# Patient Record
Sex: Female | Born: 1955 | Race: White | Hispanic: No | Smoking: Current every day smoker
Health system: Southern US, Community
[De-identification: ages and names within clinical notes are randomized; demographics above are authoritative.]

## PROBLEM LIST (undated history)

## (undated) DIAGNOSIS — J329 Chronic sinusitis, unspecified: Secondary | ICD-10-CM

## (undated) DIAGNOSIS — R112 Nausea with vomiting, unspecified: Secondary | ICD-10-CM

## (undated) DIAGNOSIS — R51 Headache: Secondary | ICD-10-CM

## (undated) DIAGNOSIS — K219 Gastro-esophageal reflux disease without esophagitis: Secondary | ICD-10-CM

## (undated) DIAGNOSIS — G43909 Migraine, unspecified, not intractable, without status migrainosus: Secondary | ICD-10-CM

## (undated) DIAGNOSIS — D649 Anemia, unspecified: Secondary | ICD-10-CM

## (undated) DIAGNOSIS — Z9889 Other specified postprocedural states: Secondary | ICD-10-CM

## (undated) DIAGNOSIS — R519 Headache, unspecified: Secondary | ICD-10-CM

## (undated) DIAGNOSIS — T8859XA Other complications of anesthesia, initial encounter: Secondary | ICD-10-CM

## (undated) DIAGNOSIS — J45909 Unspecified asthma, uncomplicated: Secondary | ICD-10-CM

## (undated) DIAGNOSIS — C4491 Basal cell carcinoma of skin, unspecified: Secondary | ICD-10-CM

## (undated) DIAGNOSIS — F419 Anxiety disorder, unspecified: Secondary | ICD-10-CM

## (undated) DIAGNOSIS — Z923 Personal history of irradiation: Secondary | ICD-10-CM

## (undated) DIAGNOSIS — K56609 Unspecified intestinal obstruction, unspecified as to partial versus complete obstruction: Secondary | ICD-10-CM

## (undated) DIAGNOSIS — C519 Malignant neoplasm of vulva, unspecified: Secondary | ICD-10-CM

## (undated) DIAGNOSIS — T7840XA Allergy, unspecified, initial encounter: Secondary | ICD-10-CM

## (undated) HISTORY — PX: NASAL SINUS SURGERY: SHX719

## (undated) HISTORY — DX: Unspecified asthma, uncomplicated: J45.909

## (undated) HISTORY — DX: Anxiety disorder, unspecified: F41.9

## (undated) HISTORY — PX: HEMORROIDECTOMY: SUR656

## (undated) HISTORY — PX: TONSILLECTOMY: SHX5217

## (undated) HISTORY — DX: Headache, unspecified: R51.9

## (undated) HISTORY — DX: Headache: R51

## (undated) HISTORY — DX: Basal cell carcinoma of skin, unspecified: C44.91

## (undated) HISTORY — DX: Gastro-esophageal reflux disease without esophagitis: K21.9

## (undated) HISTORY — DX: Migraine, unspecified, not intractable, without status migrainosus: G43.909

## (undated) HISTORY — PX: SMALL INTESTINE SURGERY: SHX150

## (undated) HISTORY — DX: Unspecified intestinal obstruction, unspecified as to partial versus complete obstruction: K56.609

## (undated) HISTORY — DX: Allergy, unspecified, initial encounter: T78.40XA

## (undated) HISTORY — PX: OTHER SURGICAL HISTORY: SHX169

## (undated) HISTORY — DX: Personal history of irradiation: Z92.3

## (undated) HISTORY — DX: Chronic sinusitis, unspecified: J32.9

## (undated) HISTORY — PX: SKIN BIOPSY: SHX1

---

## 1987-06-23 HISTORY — PX: ABDOMINAL HYSTERECTOMY: SHX81

## 2004-04-04 ENCOUNTER — Ambulatory Visit: Payer: Self-pay | Admitting: Unknown Physician Specialty

## 2004-11-10 ENCOUNTER — Emergency Department: Payer: Self-pay | Admitting: Emergency Medicine

## 2004-11-11 ENCOUNTER — Ambulatory Visit: Payer: Self-pay | Admitting: Unknown Physician Specialty

## 2004-11-11 ENCOUNTER — Inpatient Hospital Stay: Payer: Self-pay | Admitting: General Surgery

## 2005-03-31 ENCOUNTER — Ambulatory Visit: Payer: Self-pay | Admitting: Internal Medicine

## 2008-10-11 ENCOUNTER — Ambulatory Visit: Payer: Self-pay | Admitting: Internal Medicine

## 2011-06-23 LAB — HM COLONOSCOPY

## 2011-12-08 ENCOUNTER — Ambulatory Visit: Payer: Self-pay | Admitting: Internal Medicine

## 2011-12-21 ENCOUNTER — Ambulatory Visit: Payer: Self-pay | Admitting: Oncology

## 2011-12-21 LAB — CBC CANCER CENTER
Eosinophil %: 3 %
HCT: 40.9 % (ref 35.0–47.0)
HGB: 13.8 g/dL (ref 12.0–16.0)
Lymphocyte #: 1.1 x10 3/mm (ref 1.0–3.6)
Lymphocyte %: 29.5 %
MCH: 35.2 pg — ABNORMAL HIGH (ref 26.0–34.0)
MCHC: 33.7 g/dL (ref 32.0–36.0)
Monocyte %: 8.9 %
Neutrophil %: 57.2 %
Platelet: 127 x10 3/mm — ABNORMAL LOW (ref 150–440)
RBC: 3.92 10*6/uL (ref 3.80–5.20)

## 2011-12-21 LAB — IRON AND TIBC
Iron Saturation: 43 %
Iron: 165 ug/dL (ref 50–170)
Unbound Iron-Bind.Cap.: 214 ug/dL

## 2011-12-21 LAB — FOLATE: Folic Acid: 15.3 ng/mL (ref 3.1–100.0)

## 2011-12-21 LAB — LACTATE DEHYDROGENASE: LDH: 149 U/L (ref 84–246)

## 2012-01-21 ENCOUNTER — Ambulatory Visit: Payer: Self-pay | Admitting: Oncology

## 2012-04-26 ENCOUNTER — Ambulatory Visit: Payer: Self-pay | Admitting: Obstetrics and Gynecology

## 2012-04-26 LAB — CBC WITH DIFFERENTIAL/PLATELET
Eosinophil #: 0.2 10*3/uL (ref 0.0–0.7)
Eosinophil %: 3.8 %
HGB: 14.4 g/dL (ref 12.0–16.0)
Lymphocyte %: 26.9 %
MCH: 35.3 pg — ABNORMAL HIGH (ref 26.0–34.0)
MCHC: 34.7 g/dL (ref 32.0–36.0)
Monocyte #: 0.6 x10 3/mm (ref 0.2–0.9)
Neutrophil %: 55.4 %
Platelet: 121 10*3/uL — ABNORMAL LOW (ref 150–440)
RBC: 4.07 10*6/uL (ref 3.80–5.20)

## 2012-05-06 ENCOUNTER — Ambulatory Visit: Payer: Self-pay | Admitting: Obstetrics and Gynecology

## 2012-05-10 LAB — PATHOLOGY REPORT

## 2013-05-05 ENCOUNTER — Ambulatory Visit: Payer: Self-pay | Admitting: Gynecologic Oncology

## 2013-05-23 ENCOUNTER — Ambulatory Visit: Payer: Self-pay | Admitting: Gynecologic Oncology

## 2013-06-22 ENCOUNTER — Ambulatory Visit: Payer: Self-pay | Admitting: Gynecologic Oncology

## 2013-06-27 ENCOUNTER — Ambulatory Visit: Payer: Self-pay | Admitting: Gynecologic Oncology

## 2013-06-27 LAB — CBC
HCT: 42.1 % (ref 35.0–47.0)
HGB: 14.7 g/dL (ref 12.0–16.0)
MCH: 36.3 pg — ABNORMAL HIGH (ref 26.0–34.0)
MCHC: 34.9 g/dL (ref 32.0–36.0)
MCV: 104 fL — ABNORMAL HIGH (ref 80–100)
PLATELETS: 115 10*3/uL — AB (ref 150–440)
RBC: 4.04 10*6/uL (ref 3.80–5.20)
RDW: 12.5 % (ref 11.5–14.5)
WBC: 5 10*3/uL (ref 3.6–11.0)

## 2013-06-27 LAB — BASIC METABOLIC PANEL
Anion Gap: 4 — ABNORMAL LOW (ref 7–16)
BUN: 8 mg/dL (ref 7–18)
CHLORIDE: 97 mmol/L — AB (ref 98–107)
CO2: 27 mmol/L (ref 21–32)
Calcium, Total: 9.6 mg/dL (ref 8.5–10.1)
Creatinine: 0.58 mg/dL — ABNORMAL LOW (ref 0.60–1.30)
EGFR (African American): >60
EGFR (Non-African Amer.): >60
GLUCOSE: 91 mg/dL (ref 65–99)
OSMOLALITY: 255 (ref 275–301)
Potassium: 4.8 mmol/L (ref 3.5–5.1)
Sodium: 128 mmol/L — ABNORMAL LOW (ref 136–145)

## 2013-07-04 ENCOUNTER — Ambulatory Visit: Payer: Self-pay | Admitting: Gynecologic Oncology

## 2013-07-23 ENCOUNTER — Ambulatory Visit: Payer: Self-pay | Admitting: Gynecologic Oncology

## 2013-09-12 ENCOUNTER — Ambulatory Visit: Payer: Self-pay | Admitting: Oncology

## 2013-09-20 ENCOUNTER — Ambulatory Visit: Payer: Self-pay | Admitting: Oncology

## 2013-10-20 ENCOUNTER — Ambulatory Visit: Payer: Self-pay | Admitting: Oncology

## 2013-10-24 ENCOUNTER — Ambulatory Visit: Payer: Self-pay | Admitting: Gynecologic Oncology

## 2013-11-20 ENCOUNTER — Ambulatory Visit: Payer: Self-pay | Admitting: Oncology

## 2013-11-21 ENCOUNTER — Ambulatory Visit: Payer: Self-pay | Admitting: Gynecologic Oncology

## 2013-12-20 ENCOUNTER — Ambulatory Visit: Payer: Self-pay | Admitting: Gynecologic Oncology

## 2013-12-20 ENCOUNTER — Ambulatory Visit: Payer: Self-pay | Admitting: Oncology

## 2014-02-20 ENCOUNTER — Ambulatory Visit: Payer: Self-pay | Admitting: Oncology

## 2014-03-22 ENCOUNTER — Ambulatory Visit: Payer: Self-pay | Admitting: Oncology

## 2014-09-25 ENCOUNTER — Telehealth: Payer: Self-pay | Admitting: Primary Care

## 2014-09-25 ENCOUNTER — Encounter: Payer: Self-pay | Admitting: Primary Care

## 2014-09-25 ENCOUNTER — Ambulatory Visit (INDEPENDENT_AMBULATORY_CARE_PROVIDER_SITE_OTHER): Payer: Medicare Other | Admitting: Primary Care

## 2014-09-25 VITALS — BP 118/68 | HR 79 | Temp 98.1°F | Ht 62.5 in | Wt 121.0 lb

## 2014-09-25 DIAGNOSIS — N898 Other specified noninflammatory disorders of vagina: Secondary | ICD-10-CM | POA: Insufficient documentation

## 2014-09-25 DIAGNOSIS — G43909 Migraine, unspecified, not intractable, without status migrainosus: Secondary | ICD-10-CM | POA: Insufficient documentation

## 2014-09-25 DIAGNOSIS — G43901 Migraine, unspecified, not intractable, with status migrainosus: Secondary | ICD-10-CM

## 2014-09-25 HISTORY — DX: Other specified noninflammatory disorders of vagina: N89.8

## 2014-09-25 MED ORDER — TRAMADOL HCL 50 MG PO TABS: ORAL_TABLET | ORAL | Status: DC

## 2014-09-25 NOTE — Assessment & Plan Note (Signed)
Pre-cancerous. Was removed once in 2014 and returned in 2015. Referral made to GYN for further evaluation.  Will continue to monitor.

## 2014-09-25 NOTE — Assessment & Plan Note (Addendum)
Chronic for years. Was following with neurology at [facility]. Referral made to [facility] neurology per patient's request. Refilled Tramadol which she takes for prevention. Will continue to monitor progress and learn if neurology would like to try anything else preventative. Controlled substance contract signed and UDS completed in office today.

## 2014-09-25 NOTE — Progress Notes (Signed)
Pre visit review using our clinic review tool, if applicable. No additional management support is needed unless otherwise documented below in the visit note. 

## 2014-09-25 NOTE — Patient Instructions (Signed)
You will be contacted regarding your referrals to GYN and Neurology. Please let me know if you need refills on your medication. Schedule a fasting physical with me in the next 2 months. You will schedule a lab appointment one week prior to your physical at your convenience. Don't hesitate to call me if you have any questions! Welcome to [facility]!

## 2014-09-25 NOTE — Progress Notes (Signed)
Subjective:    Patient ID: Betty Hurst, female    DOB: [birth year], 59 y.o.   MRN: [id number]  HPI  Betty Hurst is a 59 year old female who presents today to establish care and discuss the problems mentioned below. Will obtain old records.  1) Sinus Surgeries/Problems: Long standing history of sinus infections. She's had 14 sinus surgeries and follows with Dr. Harrington Challenger in [facility] (ENT) who manages this. She has an appointment later this year.    2) Chronic Headaches: Headaches have been problematic since her sinus issues which include the 14 sinus surgeries.  She was following with Dr. Winfred Burn at [facility] (neurology), but is no longer in her insurance network. Her headaches are daily and has been taking tramadol daily for prevention over the past three years. She will take 1/2 to one tablet daily. She would like a referral to a neurologist within her network for management. She's tried amitriptyline and Topamax for prevention in the past which have not helped. She's not been trailed on a beta blocker due to her historically low blood pressure. She's been off of her Tramadol for three weeks and is starting to experience her migraines again. Last week she had 2 migraines. They typically will be present with photophobia, phonophobia, and nausea. Currently she's taking ibuprofen multiple times daily for pain.   3) Pre cancerous Vaginal cyst: Was removed in 2014 and was seeing Dr. Star Age from [facility]. Cyst returned in January of 2015, cyst was burned off, then returned again in March 2015 and remains. Would like referral to see Dr. Star Age at [facility] OB[facility] for further evaluation. Last pap was in 2015.   Review of Systems  Constitutional: Negative for fatigue.       Lost 80 pounds in the past 2 years, watching her diet and exercising.  HENT: Negative for rhinorrhea.        Seasonal allergies  Respiratory: Negative for shortness of breath.   Cardiovascular: Negative for chest pain.    Gastrointestinal: Negative for constipation.       See HPI.  Genitourinary: Negative for dysuria, frequency and difficulty urinating.       Left vaginal cyst. See HPI  Musculoskeletal: Negative for myalgias and arthralgias.  Skin: Negative for rash.  Neurological: Positive for headaches. Negative for dizziness.  Psychiatric/Behavioral:       Denies concerns for anxiety or depression       Past Medical History  Diagnosis Date  . Small bowel obstruction     Surgery in 2006    History   Social History  . Marital Status: Married    Spouse Name: N/A  . Number of Children: N/A  . Years of Education: N/A   Occupational History  . Not on file.   Social History Main Topics  . Smoking status: Current Every Day Smoker -- 0.50 packs/day    Types: Cigarettes  . Smokeless tobacco: Not on file  . Alcohol Use: 0.0 oz/week    0 Standard drinks or equivalent per week     Comment: occ  . Drug Use: No  . Sexual Activity: Not on file   Other Topics Concern  . Not on file   Social History Narrative   Married.   Lives in [place].   2 daughters, 3 grandchildren.   On disability from work.   Enjoys being outside, gardening, volunteers at [employer].    Past Surgical History  Procedure Laterality Date  . Nasal sinus surgery  14 surgeries  . Small intestine surgery      Small bowel obstruction in 2006  . Tonsillectomy    . Hemorroidectomy      Family History  Problem Relation Age of Onset  . Stroke Father     Deceased    Allergies  Allergen Reactions  . Morphine Hives and Swelling  . Cefuroxime Axetil Rash    No current outpatient prescriptions on file prior to visit.   No current facility-administered medications on file prior to visit.    BP 118/68 mmHg  Pulse 79  Temp(Src) 98.1 F (36.7 C) (Oral)  Ht 5' 2.5" (1.588 m)  Wt 121 lb (54.885 kg)  BMI 21.76 kg/m2  SpO2 95%    Objective:   Physical Exam  Constitutional: She is oriented  to person, place, and time. She appears well-developed.  HENT:  Head: Normocephalic.  Right Ear: External ear normal.  Left Ear: External ear normal.  Nose: Nose normal.  Mouth/Throat: Oropharynx is clear and moist.  Eyes: Conjunctivae and EOM are normal. Pupils are equal, round, and reactive to light.  Neck: Neck supple. No thyromegaly present.  Cardiovascular: Normal rate and regular rhythm.   Pulmonary/Chest: Effort normal and breath sounds normal.  Abdominal: Soft. Bowel sounds are normal. There is no tenderness.  Musculoskeletal: Normal range of motion.  Lymphadenopathy:    She has no cervical adenopathy.  Neurological: She is alert and oriented to person, place, and time. She has normal reflexes.  Skin: Skin is warm and dry.  Psychiatric: She has a normal mood and affect.          Assessment & Plan:

## 2014-09-25 NOTE — Telephone Encounter (Signed)
emmi mailed  °

## 2014-09-26 ENCOUNTER — Encounter: Payer: Self-pay | Admitting: Primary Care

## 2014-10-13 NOTE — Op Note (Signed)
PATIENT NAME:  Betty Hurst, THURLOW MR#:  [id number] DATE OF BIRTH:  January 14, 1956  DATE OF PROCEDURE:  07/04/2013  PREOPERATIVE DIAGNOSIS: Vaginal intraepithelial neoplasia 3.  POSTOPERATIVE DIAGNOSIS: Vaginal intraepithelial neoplasia 3.   PROCEDURE PERFORMED: Laser vaporization.   SURGEON: Weber Cooks, M.D.   ANESTHESIA: General.   BLOOD LOSS:  None.   INDICATION FOR SURGERY: Ms. Devins is a 59 year old patient who presented with vulvar lesions and biopsy revealed VIN-3. Therefore, the decision was made to proceed with laser vaporization.   FINDINGS AT TIME OF SURGERY: External genitalia with areas of thin white epithelium. No ulceration. No signs of invasion.   OPERATIVE REPORT: After adequate general anesthesia had been obtained, the patient was prepped and draped in ski position. The vulva was evaluated. Using the CO2 laser at a wattage of 20, the abnormal vulvar areas were vaporized. This was done in 2 layers. There were no complications. The carbonation was removed. Silvadene cream was applied.   The patient tolerated the procedure well and was taken to the recovery room in satisfactory condition. Pad, sponge, needle and instrument were correct x 2.   ____________________________ Weber Cooks, MD bem:cs D: 07/11/2013 14:26:30 ET T: 07/11/2013 15:11:50 ET JOB#: [id number]  cc: Weber Cooks, MD, <Dictator> Weber Cooks MD ELECTRONICALLY SIGNED 07/25/2013 12:12

## 2014-10-23 ENCOUNTER — Other Ambulatory Visit: Payer: Self-pay | Admitting: Family Medicine

## 2014-10-23 NOTE — Telephone Encounter (Signed)
Last office visit 09/25/2014.  Last refilled 09/25/2014 for #30 with no refills.  Ok to refill?

## 2014-10-24 ENCOUNTER — Telehealth: Payer: Self-pay | Admitting: Primary Care

## 2014-10-24 NOTE — Telephone Encounter (Signed)
Will you please notify Betty Hurst that I she may pick up her prescription for Tramadol at her convenience.   Thank you!

## 2014-10-24 NOTE — Telephone Encounter (Signed)
[name] printed the Rx. Called patient. Notified her that Rx is ready for pick up. Left in front office.

## 2014-10-24 NOTE — Telephone Encounter (Signed)
Called patient. Notified her that Rx is ready for pick. Left in front office.

## 2014-10-27 ENCOUNTER — Other Ambulatory Visit: Payer: Self-pay | Admitting: Family Medicine

## 2014-10-31 ENCOUNTER — Encounter: Payer: Self-pay | Admitting: Primary Care

## 2014-11-15 ENCOUNTER — Other Ambulatory Visit: Payer: Self-pay | Admitting: Primary Care

## 2014-11-15 DIAGNOSIS — Z0000 Encounter for general adult medical examination without abnormal findings: Secondary | ICD-10-CM

## 2014-11-20 ENCOUNTER — Other Ambulatory Visit (INDEPENDENT_AMBULATORY_CARE_PROVIDER_SITE_OTHER): Payer: Medicare Other

## 2014-11-20 DIAGNOSIS — Z0000 Encounter for general adult medical examination without abnormal findings: Secondary | ICD-10-CM

## 2014-11-20 LAB — COMPREHENSIVE METABOLIC PANEL
ALT: 13 U/L (ref 0–35)
AST: 19 U/L (ref 0–37)
Albumin: 4.4 g/dL (ref 3.5–5.2)
Alkaline Phosphatase: 61 U/L (ref 39–117)
BILIRUBIN TOTAL: 0.8 mg/dL (ref 0.2–1.2)
BUN: 11 mg/dL (ref 6–23)
CHLORIDE: 97 meq/L (ref 96–112)
CO2: 28 meq/L (ref 19–32)
Calcium: 9.9 mg/dL (ref 8.4–10.5)
Creatinine, Ser: 0.8 mg/dL (ref 0.40–1.20)
GFR: 78.67 mL/min (ref >60.00)
Glucose, Bld: 90 mg/dL (ref 70–99)
POTASSIUM: 4.8 meq/L (ref 3.5–5.1)
SODIUM: 133 meq/L — AB (ref 135–145)
TOTAL PROTEIN: 7.4 g/dL (ref 6.0–8.3)

## 2014-11-20 LAB — CBC WITH DIFFERENTIAL/PLATELET
BASOS ABS: 0.1 10*3/uL (ref 0.0–0.1)
BASOS PCT: 1.4 % (ref 0.0–3.0)
Eosinophils Absolute: 0.2 10*3/uL (ref 0.0–0.7)
Eosinophils Relative: 3.5 % (ref 0.0–5.0)
HEMATOCRIT: 46.7 % — AB (ref 36.0–46.0)
HEMOGLOBIN: 15.9 g/dL — AB (ref 12.0–15.0)
LYMPHS ABS: 1.2 10*3/uL (ref 0.7–4.0)
LYMPHS PCT: 25.9 % (ref 12.0–46.0)
MCHC: 34.2 g/dL (ref 30.0–36.0)
MCV: 105.8 fl — ABNORMAL HIGH (ref 78.0–100.0)
MONOS PCT: 10.6 % (ref 3.0–12.0)
Monocytes Absolute: 0.5 10*3/uL (ref 0.1–1.0)
Neutro Abs: 2.8 10*3/uL (ref 1.4–7.7)
Neutrophils Relative %: 58.6 % (ref 43.0–77.0)
Platelets: 110 10*3/uL — ABNORMAL LOW (ref 150.0–400.0)
RBC: 4.41 Mil/uL (ref 3.87–5.11)
RDW: 12.8 % (ref 11.5–15.5)
WBC: 4.8 10*3/uL (ref 4.0–10.5)

## 2014-11-20 LAB — HEMOGLOBIN A1C: Hgb A1c MFr Bld: 4.8 % (ref 4.6–6.5)

## 2014-11-20 LAB — LIPID PANEL
CHOLESTEROL: 215 mg/dL — AB (ref 0–200)
HDL: 110.2 mg/dL (ref >39.00)
LDL Cholesterol: 94 mg/dL (ref 0–99)
NonHDL: 104.8
TRIGLYCERIDES: 55 mg/dL (ref 0.0–149.0)
Total CHOL/HDL Ratio: 2
VLDL: 11 mg/dL (ref 0.0–40.0)

## 2014-11-20 LAB — VITAMIN D 25 HYDROXY (VIT D DEFICIENCY, FRACTURES): VITD: 24.75 ng/mL — ABNORMAL LOW (ref 30.00–100.00)

## 2014-11-20 LAB — TSH: TSH: 0.83 u[IU]/mL (ref 0.35–4.50)

## 2014-11-21 ENCOUNTER — Encounter: Payer: Self-pay | Admitting: *Deleted

## 2014-11-21 ENCOUNTER — Encounter (INDEPENDENT_AMBULATORY_CARE_PROVIDER_SITE_OTHER): Payer: Self-pay

## 2014-11-26 ENCOUNTER — Encounter: Payer: Self-pay | Admitting: Primary Care

## 2014-11-26 ENCOUNTER — Ambulatory Visit (INDEPENDENT_AMBULATORY_CARE_PROVIDER_SITE_OTHER): Payer: Medicare Other | Admitting: Primary Care

## 2014-11-26 VITALS — BP 120/74 | HR 100 | Temp 98.0°F | Ht 63.0 in | Wt 121.1 lb

## 2014-11-26 DIAGNOSIS — G43901 Migraine, unspecified, not intractable, with status migrainosus: Secondary | ICD-10-CM

## 2014-11-26 DIAGNOSIS — Z0000 Encounter for general adult medical examination without abnormal findings: Secondary | ICD-10-CM

## 2014-11-26 DIAGNOSIS — Z13820 Encounter for screening for osteoporosis: Secondary | ICD-10-CM | POA: Diagnosis not present

## 2014-11-26 DIAGNOSIS — G8929 Other chronic pain: Secondary | ICD-10-CM

## 2014-11-26 DIAGNOSIS — Z23 Encounter for immunization: Secondary | ICD-10-CM | POA: Diagnosis not present

## 2014-11-26 DIAGNOSIS — R519 Headache, unspecified: Secondary | ICD-10-CM

## 2014-11-26 DIAGNOSIS — R630 Anorexia: Secondary | ICD-10-CM | POA: Diagnosis not present

## 2014-11-26 DIAGNOSIS — Z720 Tobacco use: Secondary | ICD-10-CM

## 2014-11-26 DIAGNOSIS — R51 Headache: Secondary | ICD-10-CM

## 2014-11-26 MED ORDER — TRAMADOL HCL 50 MG PO TABS: ORAL_TABLET | ORAL | Status: DC

## 2014-11-26 NOTE — Assessment & Plan Note (Signed)
Smoked 1/2 PPD of cigarettes x 16 years. She is interested in quitting but has yet to set a quit date. Encouraged her on her decision and to plan out a quit date. Discussed patches and gum

## 2014-11-26 NOTE — Progress Notes (Signed)
Patient ID: Betty Hurst, female   DOB: 24-Dec-1955, 59 y.o.   MRN: [id number]  HPI: Betty Hurst is a 59 year old female who presents today for Medicare Wellness Visit.  Past Medical History  Diagnosis Date  . Small bowel obstruction     Surgery in 2006    Current Outpatient Prescriptions  Medication Sig Dispense Refill  . traMADol (ULTRAM) 50 MG tablet TAKE 1/2 TO 1 TABLET DAILY FOR HEADACHE PREVENTION 30 tablet 0   No current facility-administered medications for this visit.    Allergies  Allergen Reactions  . Morphine Hives and Swelling  . Cefuroxime Axetil Rash    Family History  Problem Relation Age of Onset  . Stroke Father     Deceased    History   Social History  . Marital Status: Married    Spouse Name: N/A  . Number of Children: N/A  . Years of Education: N/A   Occupational History  . Not on file.   Social History Main Topics  . Smoking status: Current Every Day Smoker -- 0.50 packs/day    Types: Cigarettes  . Smokeless tobacco: Not on file  . Alcohol Use: 0.0 oz/week    0 Standard drinks or equivalent per week     Comment: occ  . Drug Use: No  . Sexual Activity: Not on file   Other Topics Concern  . Not on file   Social History Narrative   Married.   Lives in [place].   2 daughters, 3 grandchildren.   On disability from work.   Enjoys being outside, gardening, volunteers at [employer].    Hospitiliaztions: None over past year.  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  Pneumovax: She's not completed.  Bone Density: Never completed  Colonoscopy: Completed in 2014. Repeat in 2024.  Eye Doctor: Completed 2 years ago. Plans on scheduling an appointment.  Dental Exam: Completed last year. She is scheduled for July.  PAP: Completed October 2015  Mammogram: Completed October 2015.    I have personally reviewed and have noted: 1. The patient's medical and  social history 2. Their use of alcohol, tobacco or illicit drugs 3. Their current medications and supplements 4. The patient's functional ability including ADL's, fall risks, home safety risks and hearing or visual  impairment. 5. Diet and physical activities 6. Evidence for depression or mood disorder  Subjective:   Review of Systems:   Constitutional: Denies fever, malaise, fatigue, headache or abrupt weight changes.  HEENT: Denies eye pain, eye redness, ear pain, ringing in the ears, wax buildup, runny nose, nasal congestion, bloody nose, or sore throat. Respiratory: Denies difficulty breathing, shortness of breath, cough or sputum production.   Cardiovascular: Denies chest pain, chest tightness, palpitations or swelling in the hands or feet.  Gastrointestinal: Denies abdominal pain, bloating, constipation, blood in the stool. She's had diarrhea recently due to stress. She's also had a decrease in appetite for the past several months.  GU: Denies urgency, frequency, pain with urination, burning sensation, blood in urine, odor or discharge. Musculoskeletal: Denies decrease in range of motion, difficulty with gait, muscle pain or joint pain and swelling.  Skin: Denies redness, rashes, lesions or ulcercations.  Neurological: Denies dizziness, difficulty with memory, difficulty with speech or problems with balance and coordination. She is going to the headache clinic next Tuesday for evaluation of chronic headaches.  No other specific complaints in a complete  review of systems (except as listed in HPI above).  Objective:  PE:   BP 120/74 mmHg  Pulse 100  Temp(Src) 98 F (36.7 C) (Oral)  Ht 5\' 3"  (1.6 m)  Wt 121 lb 1.9 oz (54.94 kg)  BMI 21.46 kg/m2  SpO2 97% Wt Readings from Last 3 Encounters:  11/26/14 121 lb 1.9 oz (54.94 kg)  09/25/14 121 lb (54.885 kg)    General: Appears their stated age, well developed, well nourished in NAD. Skin: Warm, dry and intact. No rashes,  lesions or ulcerations noted. HEENT: Head: normal shape and size; Eyes: sclera white, no icterus, conjunctiva pink, PERRLA and EOMs intact; Ears: Tm's gray and intact, normal light reflex; Nose: mucosa pink and moist, septum midline; Throat/Mouth: Teeth present, mucosa pink and moist, no exudate, lesions or ulcerations noted.  Neck: Normal range of motion. Neck supple, trachea midline. No massses, lumps or thyromegaly present.  Cardiovascular: Normal rate and rhythm. S1,S2 noted.  No murmur, rubs or gallops noted. No JVD or BLE edema. No carotid bruits noted. Pulmonary/Chest: Normal effort and positive vesicular breath sounds. No respiratory distress. No wheezes, rales or ronchi noted.  Abdomen: Soft and nontender. Normal bowel sounds, no bruits noted. No distention or masses noted. Liver, spleen and kidneys non palpable. Musculoskeletal: Normal range of motion. No signs of joint swelling. No difficulty with gait.  Neurological: Alert and oriented. Cranial nerves II-XII intact. Coordination normal. +DTRs bilaterally. Psychiatric: Mood and affect normal. Behavior is normal. Judgment and thought content normal.   1) Tobacco Abuse: Smokes 1/2 PPD of cigarettes since 2000.  She is interested in quitting but does not have a quit date in mind.  EKG:  BMET    Component Value Date/Time   NA 133* 11/20/2014 1107   NA 128* 06/27/2013 1350   K 4.8 11/20/2014 1107   K 4.8 06/27/2013 1350   CL 97 11/20/2014 1107   CL 97* 06/27/2013 1350   CO2 28 11/20/2014 1107   CO2 27 06/27/2013 1350   GLUCOSE 90 11/20/2014 1107   GLUCOSE 91 06/27/2013 1350   BUN 11 11/20/2014 1107   BUN 8 06/27/2013 1350   CREATININE 0.80 11/20/2014 1107   CREATININE 0.58* 06/27/2013 1350   CALCIUM 9.9 11/20/2014 1107   CALCIUM 9.6 06/27/2013 1350   GFRNONAA >60 06/27/2013 1350   GFRAA >60 06/27/2013 1350    Lipid Panel     Component Value Date/Time   CHOL 215* 11/20/2014 1107   TRIG 55.0 11/20/2014 1107   HDL 110.20  11/20/2014 1107   CHOLHDL 2 11/20/2014 1107   VLDL 11.0 11/20/2014 1107   LDLCALC 94 11/20/2014 1107    CBC    Component Value Date/Time   WBC 4.8 11/20/2014 1107   WBC 5.0 06/27/2013 1350   RBC 4.41 11/20/2014 1107   RBC 4.04 06/27/2013 1350   HGB 15.9* 11/20/2014 1107   HGB 14.7 06/27/2013 1350   HCT 46.7* 11/20/2014 1107   HCT 42.1 06/27/2013 1350   PLT 110.0* 11/20/2014 1107   PLT 115* 06/27/2013 1350   MCV 105.8* 11/20/2014 1107   MCV 104* 06/27/2013 1350   MCH 36.3* 06/27/2013 1350   MCHC 34.2 11/20/2014 1107   MCHC 34.9 06/27/2013 1350   RDW 12.8 11/20/2014 1107   RDW 12.5 06/27/2013 1350   LYMPHSABS 1.2 11/20/2014 1107   LYMPHSABS 1.2 04/26/2012 1007   MONOABS 0.5 11/20/2014 1107   MONOABS 0.6 04/26/2012 1007   EOSABS 0.2 11/20/2014 1107   EOSABS 0.2 04/26/2012  1007   BASOSABS 0.1 11/20/2014 1107   BASOSABS 0.1 04/26/2012 1007    Hgb A1C Lab Results  Component Value Date   HGBA1C 4.8 11/20/2014      Assessment and Plan:   1) Vitamin D Deficiency: Start Calcium 1200 mg with Vitamin D 600 units daily. Scheduled Dexa Bone Density scan.  2) Tobacco abuse: Select a quit date for tobacco cessation.   3) Chronic Headaches: Follow up with headache specialist as scheduled next Tuesday.  Medicare Annual Wellness Visit:  Diet: Overall healthy with lean meat, vegetables. No fast food consumption. She mainly drinks diet coke, water, gatorade. Physical activity: Walks daily, active in the yard. Depression/mood screen: Negative Hearing: Intact to whispered voice Visual acuity: Grossly normal, performs annual eye exam  ADLs: Capable Fall risk: None Home safety: Good Cognitive evaluation: Intact to orientation, naming, recall and repetition EOL planning: Adv directives, full code/ I agree  Preventative Medicine: Encouraged ms. Popwell to continue her healthy diet and active lifestyle. It was also recommended she select a quit date for tobacco cessation.   All  providers: Alma Friendly, NP- PCP, Doretha Sou, [facility].  Next appointment: Headache clinic next Tuesday. Dr. Star Age in 2 weeks. Alma Friendly in 4 months.

## 2014-11-26 NOTE — Addendum Note (Signed)
Addended by: Jacqualin Combes on: 11/26/2014 04:56 PM   Modules accepted: Orders

## 2014-11-26 NOTE — Assessment & Plan Note (Signed)
Appointment scheduled with headache specialist next Tuesday. Refilled Tramadol.

## 2014-11-26 NOTE — Patient Instructions (Addendum)
Start taking Calcium with vitamin D over the counter. You need to get 1200 mg of calcium and 600 units of vitamin D daily in divided doses. You will be contacted regarding your referral for Bone Density Testing.  Please let us know if you have not heard back within one week.  Complete lab work prior to leaving today. I will notify you of your results. You've been provided a pneumonia vaccine.   Follow up in 4 months for check up.

## 2014-11-26 NOTE — Assessment & Plan Note (Signed)
Pneumovax needed, administered today. Other vaccines up to date. Ordered Dexa bone density scan. Recommended she complete eye exam as it has been 2 years.  Labs unremarkable with the exception of vitamin D. Start Calcium with Vitamin D. OTC daily.

## 2014-11-26 NOTE — Progress Notes (Signed)
Pre visit review using our clinic review tool, if applicable. No additional management support is needed unless otherwise documented below in the visit note. 

## 2014-11-27 ENCOUNTER — Telehealth: Payer: Self-pay | Admitting: Primary Care

## 2014-11-27 LAB — VITAMIN B12: Vitamin B-12: 230 pg/mL (ref 211–911)

## 2014-11-27 LAB — FOLATE: Folate: 17.6 ng/mL (ref >5.9)

## 2014-11-27 NOTE — Telephone Encounter (Signed)
Pt left v/m returning call and request cb [phone].

## 2014-11-27 NOTE — Telephone Encounter (Signed)
Left message asking pt to call office Please see where her last bone density was at. If she has never had one  Ask if she wants to go to [place] or [place] Any day and time

## 2014-11-29 NOTE — Telephone Encounter (Signed)
Left message asking pt to call office  °

## 2014-12-05 ENCOUNTER — Ambulatory Visit: Payer: Medicare Other | Admitting: Neurology

## 2014-12-05 NOTE — Telephone Encounter (Signed)
Left message asking pt to call office  °

## 2014-12-06 NOTE — Telephone Encounter (Signed)
Left message asking pt to call.

## 2014-12-06 NOTE — Telephone Encounter (Signed)
Appointment 6/20 @ [place] Pt aware

## 2014-12-10 ENCOUNTER — Ambulatory Visit: Payer: Medicare Other | Attending: Primary Care

## 2014-12-21 ENCOUNTER — Other Ambulatory Visit: Payer: Self-pay | Admitting: Primary Care

## 2014-12-21 NOTE — Telephone Encounter (Signed)
Electronic refill request. Last Filled:    30 tablet 0 RF on 11/26/2014  Please advise.

## 2014-12-24 NOTE — Telephone Encounter (Signed)
Please call in.  Thanks.   

## 2014-12-25 NOTE — Telephone Encounter (Signed)
Medication phoned to pharmacy.  

## 2015-01-08 ENCOUNTER — Encounter: Payer: Self-pay | Admitting: Neurology

## 2015-01-08 ENCOUNTER — Ambulatory Visit (INDEPENDENT_AMBULATORY_CARE_PROVIDER_SITE_OTHER): Payer: Medicare Other | Admitting: Neurology

## 2015-01-08 VITALS — BP 126/68 | HR 92 | Resp 20 | Ht 62.5 in | Wt 121.6 lb

## 2015-01-08 DIAGNOSIS — M542 Cervicalgia: Secondary | ICD-10-CM

## 2015-01-08 DIAGNOSIS — G43719 Chronic migraine without aura, intractable, without status migrainosus: Secondary | ICD-10-CM

## 2015-01-08 DIAGNOSIS — Z720 Tobacco use: Secondary | ICD-10-CM | POA: Diagnosis not present

## 2015-01-08 DIAGNOSIS — G43711 Chronic migraine without aura, intractable, with status migrainosus: Secondary | ICD-10-CM | POA: Insufficient documentation

## 2015-01-08 HISTORY — DX: Cervicalgia: M54.2

## 2015-01-08 MED ORDER — TIZANIDINE HCL 4 MG PO TABS: ORAL_TABLET | ORAL | Status: DC

## 2015-01-08 NOTE — Progress Notes (Signed)
NEUROLOGY CONSULTATION NOTE  Betty Hurst MRN: [id number] DOB: 31-Mar-1956  Referring provider: Alma Friendly, NP Primary care provider: Alma Friendly, NP  Reason for consult:  Chronic migraine  HISTORY OF PRESENT ILLNESS: Betty Hurst is a 59 year old right-handed female with tobacco abuse who presents for migraine.  Onset:  She has history of migraines since at least 1991.  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 Location:  Bi-frontal and back of head and neck Quality:  Pounding.  She has allodynia involving her forehead. Intensity:  8-9/10 Aura:  no Prodrome:  no Associated symptoms:  Sometimes associated with nausea, vomiting, photophobia, phonophobia, osmophobia, blurred vision (about 1-2 times a month) Duration:  constant Frequency:  constant Triggers/exacerbating factors:  none Relieving factors:  none Activity:  Able to function  Past abortive medication:  Toradol, tylenol, excedrin, advil, sumatriptan Past preventative medication:  Amitriptyline, topiramate, depakote, nortriptyline, sertraline, methadone Other past therapy:  Botox (effective), acupuncture  Current abortive medication:  none Current preventative medication:  Tramadol 25mg  twice daily  Caffeine:  1 can of diet coke daily Alcohol:  occasionally Smoker:  yes Diet:  Eats healthy (no fried foods, eats fresh vegetables, grills) Exercise:  walks Depression/stress:  no Sleep hygiene:  poor Family history of headache:  no  PAST MEDICAL HISTORY: Past Medical History  Diagnosis Date  . Small bowel obstruction     Surgery in 2006  . Headache     PAST SURGICAL HISTORY: Past Surgical History  Procedure Laterality Date  . Nasal sinus surgery      14 surgeries  . Small intestine surgery      Small bowel obstruction in 2006  . Tonsillectomy    . Hemorroidectomy    . Other surgical history        Bladder sling    MEDICATIONS: Current Outpatient Prescriptions on File Prior to Visit  Medication Sig Dispense Refill  . traMADol (ULTRAM) 50 MG tablet TAKE 1/2 TO 1 TABLET DAILY FOR HEADACHE PREVENTION 30 tablet 0   No current facility-administered medications on file prior to visit.    ALLERGIES: Allergies  Allergen Reactions  . Influenza Vaccines Hives  . Morphine Hives and Swelling  . Cefuroxime Axetil Rash    FAMILY HISTORY: Family History  Problem Relation Age of Onset  . Stroke Father     Deceased  . Renal Disease Paternal Grandfather   . Heart failure Paternal Grandfather     SOCIAL HISTORY: History   Social History  . Marital Status: Married    Spouse Name: N/A  . Number of Children: N/A  . Years of Education: N/A   Occupational History  . Not on file.   Social History Main Topics  . Smoking status: Current Every Day Smoker -- 0.50 packs/day    Types: Cigarettes  . Smokeless tobacco: Never Used     Comment: patient is aware that she needs to quit   . Alcohol Use: 0.0 oz/week    0 Standard drinks or equivalent per week     Comment: occ  . Drug Use: No  . Sexual Activity:    Partners: Male   Other Topics Concern  . Not on file   Social History Narrative   Married.   Lives in [place].   2 daughters, 3 grandchildren.   On disability from work.   Enjoys being outside, gardening, volunteers at [employer].  REVIEW OF SYSTEMS: Constitutional: No fevers, chills, or sweats, no generalized fatigue, change in appetite Eyes: No visual changes, double vision, eye pain Ear, nose and throat: No hearing loss, ear pain, nasal congestion, sore throat Cardiovascular: No chest pain, palpitations Respiratory:  No shortness of breath at rest or with exertion, wheezes GastrointestinaI: No nausea, vomiting, diarrhea, abdominal pain, fecal incontinence Genitourinary:  No dysuria, urinary retention or frequency Musculoskeletal:  Neck  pain Integumentary: No rash, pruritus, skin lesions Neurological: as above Psychiatric: poor sleep Endocrine: No palpitations, fatigue, diaphoresis, mood swings, change in appetite, change in weight, increased thirst Hematologic/Lymphatic:  No anemia, purpura, petechiae. Allergic/Immunologic: no itchy/runny eyes, nasal congestion, recent allergic reactions, rashes  PHYSICAL EXAM: Filed Vitals:   01/08/15 1220  BP: 126/68  Pulse: 92  Resp: 20   General: No acute distress.  Patient appears well-groomed.   Head:  Mid-frontal protrusion.  Otherwise, Normocephalic/atraumatic Eyes:  fundi unremarkable, without vessel changes, exudates, hemorrhages or papilledema. Neck: supple, bilateral suboccipital and paraspinal tenderness, full range of motion Back: No paraspinal tenderness Heart: regular rate and rhythm Lungs: Clear to auscultation bilaterally. Vascular: No carotid bruits. Neurological Exam: Mental status: alert and oriented to person, place, and time, recent and remote memory intact, fund of knowledge intact, attention and concentration intact, speech fluent and not dysarthric, language intact. Cranial nerves: CN I: not tested CN II: pupils equal, round and reactive to light, visual fields intact, fundi unremarkable, without vessel changes, exudates, hemorrhages or papilledema. CN III, IV, VI:  full range of motion, no nystagmus, no ptosis CN V: facial sensation intact CN VII: upper and lower face symmetric CN VIII: hearing intact CN IX, X: gag intact, uvula midline CN XI: sternocleidomastoid and trapezius muscles intact CN XII: tongue midline Bulk & Tone: normal, no fasciculations. Motor:  5/5 throughout Sensation: pinprick and vibration intact Deep Tendon Reflexes:  2+ throughout, toes downgoing Finger to nose testing:  No dysmetria Heel to shin:  intact Gait:  Normal station and stride.  Able to turn and walk in tandem. Romberg negative.  IMPRESSION: Chronic migraine  without aura Neck pain  PLAN: 1.  Will set up for Botox 2.  Will provide tizanidine (2mg  to 4mg  every 8 hours as needed for neck pain) 3.  I don't prescribe tramadol as a daily medication 4.  Discussed smoking cessation  45 minutes spent face to face with patient, over 50% spent discussing management.  Thank you for allowing me to take part in the care of this patient.  Metta Clines, DO  CC: Alma Friendly, NP

## 2015-01-08 NOTE — Patient Instructions (Addendum)
1.  Will try to get pre-approval for Botox 2.  For neck pain, try tizanidine 4mg .  Take 1/2 to 1 tablet every 8 hours as needed for neck pain (only take if really needed).  Caution as it may cause drowsiness. 3.  Follow up for Botox

## 2015-01-14 ENCOUNTER — Telehealth: Payer: Self-pay

## 2015-01-14 NOTE — Telephone Encounter (Signed)
Left a voicemail for patient to return my call, in regards to scheduling a Mammogram.  

## 2015-01-16 ENCOUNTER — Encounter: Payer: Self-pay | Admitting: *Deleted

## 2015-01-16 ENCOUNTER — Telehealth: Payer: Self-pay | Admitting: Neurology

## 2015-01-16 NOTE — Telephone Encounter (Signed)
Pt returned your call in regards to Botox/Dawn CB# [phone]

## 2015-01-16 NOTE — Progress Notes (Signed)
03/22/2014 MAMMOGRAM screening completed (progress note 11/26/14)

## 2015-01-16 NOTE — Telephone Encounter (Signed)
Patient is aware of her BOTOX appointment 02/06/15 12:45pm

## 2015-01-21 ENCOUNTER — Other Ambulatory Visit: Payer: Self-pay | Admitting: Family Medicine

## 2015-01-22 NOTE — Telephone Encounter (Signed)
Refill request. tramadol  50 MG tablet   Dispense: 30 tablet   Refills: 0    Last prescribed on 12/24/14. Next follow up at 04/01/15.

## 2015-01-23 ENCOUNTER — Telehealth: Payer: Self-pay | Admitting: Primary Care

## 2015-01-23 MED ORDER — TRAMADOL HCL 50 MG PO TABS: ORAL_TABLET | ORAL | Status: DC

## 2015-01-23 NOTE — Addendum Note (Signed)
Addended by: Jacqualin Combes on: 01/23/2015 02:28 PM   Modules accepted: Orders

## 2015-01-23 NOTE — Telephone Encounter (Addendum)
Pt left v/m requesting refill tramadol [facility] until starts botox 3rd week in August. Neurologist gave muscle relaxer but pt cannot take due to muscle relaxant hurts pt's stomach. Pt request cb.

## 2015-01-23 NOTE — Telephone Encounter (Signed)
Okay to refill since she will not be starting with headache management until late August. Same sig and directions as prior prescription. No refills. Thanks.

## 2015-01-23 NOTE — Telephone Encounter (Signed)
[facility] - [facility] 50 MG tablet Dispense: 30 tablet   Refills: 0   TAKE 1/2 TO 1 TABLET BY MOUTH DAILY AS NEEDED FOR HEADACH PREVENTION

## 2015-02-06 ENCOUNTER — Ambulatory Visit (INDEPENDENT_AMBULATORY_CARE_PROVIDER_SITE_OTHER): Payer: Medicare Other | Admitting: Neurology

## 2015-02-06 ENCOUNTER — Encounter: Payer: Self-pay | Admitting: Neurology

## 2015-02-06 VITALS — BP 120/74 | HR 111 | Wt 122.3 lb

## 2015-02-06 DIAGNOSIS — G43719 Chronic migraine without aura, intractable, without status migrainosus: Secondary | ICD-10-CM | POA: Diagnosis not present

## 2015-02-06 MED ORDER — ONABOTULINUMTOXINA 100 UNITS IJ SOLR
200.0000 [IU] | Freq: Once | INTRAMUSCULAR | Status: AC
  Administered 2015-02-06: 200 [IU] via INTRAMUSCULAR

## 2015-02-06 MED ORDER — ONABOTULINUMTOXINA 100 UNITS IJ SOLR: 100.0000 [IU] | Freq: Once | INTRAMUSCULAR | Status: DC

## 2015-02-06 NOTE — Procedures (Signed)
BOTOX PROCEDURE NOTE  Risks and benefits were discussed with the patient, who appears to understand the discussion and verbal consent was obtained.  200 units were reconstituted with 4 mL 0.9% sodium chloride.  Skin was cleaned with alcohol prior to injection.  A 30-gauge, 0.5-inch needle was used.  Total:  4 mL (200 units)  # of injection sites Muscle   Dose Total 1 each side  Corrugator   0.2 mL (10 units) 1    Procerus   0.1 mL (5 units) 2 each side  Frontalis  0.4 mL (20 units) 4 each side  Temporalis  0.8 mL (40 units) 3 each side  Occipitalis  0.6 mL (30 units) 2 each side  Cervical paraspinals 0.4 mL (20 units) 3 each side  Trapezius  0.6 mL (30 units)    Total 31  Total used  155 units    Total wasted  45 units  The patient tolerated the procedure.    Bradd Merlos R. Tomi Likens, DO

## 2015-02-06 NOTE — Progress Notes (Signed)
See Botox Procedure Note

## 2015-02-20 ENCOUNTER — Telehealth: Payer: Self-pay | Admitting: Primary Care

## 2015-02-20 NOTE — Telephone Encounter (Signed)
Electronically refill request  Last prescribed on 4 weeks ago (01/23/2015) tramadol (ULTRAM) 50 MG tablet Dispense: 30 tablet   Refills: 0   Last seen on 12/06/14. Next apt on 04/01/15.

## 2015-02-21 ENCOUNTER — Telehealth: Payer: Self-pay | Admitting: Primary Care

## 2015-02-21 DIAGNOSIS — R519 Headache, unspecified: Secondary | ICD-10-CM

## 2015-02-21 DIAGNOSIS — R51 Headache: Principal | ICD-10-CM

## 2015-02-21 NOTE — Telephone Encounter (Signed)
She had botox injections on 8/17 and has follow up on 9/30. The goal is to wean her off of Tramadol. We will provide a refill to allow time for the botox to take effect. She is to discuss management options with neurology, or I'm happy to send a referral to pain management. Please phone in Tramadol 50 mg. Take 1/2 to 1 tablet daily for headache prevention. #30, no refills. Thanks.

## 2015-02-21 NOTE — Telephone Encounter (Signed)
Pt would like return call regarding denied medication. Pt states that she is getting botox but not for 6 weeks and can not take muscle relaxer that was prescribed call back number is [phone]

## 2015-02-22 NOTE — Telephone Encounter (Signed)
I left a message for the patient to return my call.

## 2015-02-22 NOTE — Telephone Encounter (Signed)
Already called [facility]

## 2015-02-27 NOTE — Telephone Encounter (Signed)
Pt left v/m returning call and request cb. 

## 2015-02-27 NOTE — Telephone Encounter (Signed)
I left a message for the patient to return my call.

## 2015-02-28 ENCOUNTER — Telehealth: Payer: Self-pay | Admitting: Primary Care

## 2015-02-28 NOTE — Telephone Encounter (Signed)
Noted. Refill sent 1 week ago. Will continue to monitor.

## 2015-02-28 NOTE — Telephone Encounter (Signed)
Patient called back. Notified patient of Kate's comments. Patient stated that she was told it will take several rounds until she would feel the effect of the Botox (6 months to a year). Patient stated she can get Botox every 3 months.  Patient has been getting a lot more neck pain and she was given muscle relaxer but stop taking it since it upset her stomach.

## 2015-02-28 NOTE — Telephone Encounter (Signed)
I left a message for the patient to return my call.

## 2015-03-22 ENCOUNTER — Ambulatory Visit: Payer: Medicare Other | Admitting: Neurology

## 2015-03-25 ENCOUNTER — Other Ambulatory Visit: Payer: Self-pay | Admitting: Primary Care

## 2015-03-25 NOTE — Telephone Encounter (Signed)
Electronically refill request for   tramadol (ULTRAM) 50 MG tablet  Take 1/2 to 1 tablet daily for headache prevention Dispense: 30 tablet   Refills: 0   Last prescribed on 01/23/15. Last seen on 11/26/14. Next follow up on 04/01/15.

## 2015-03-26 NOTE — Telephone Encounter (Signed)
[facility]

## 2015-03-27 ENCOUNTER — Encounter: Payer: Self-pay | Admitting: Neurology

## 2015-03-27 ENCOUNTER — Ambulatory Visit (INDEPENDENT_AMBULATORY_CARE_PROVIDER_SITE_OTHER): Payer: Medicare Other | Admitting: Neurology

## 2015-03-27 VITALS — BP 158/80 | HR 90 | Ht 61.0 in | Wt 125.1 lb

## 2015-03-27 DIAGNOSIS — M25519 Pain in unspecified shoulder: Secondary | ICD-10-CM

## 2015-03-27 DIAGNOSIS — Z720 Tobacco use: Secondary | ICD-10-CM | POA: Diagnosis not present

## 2015-03-27 DIAGNOSIS — IMO0001 Reserved for inherently not codable concepts without codable children: Secondary | ICD-10-CM

## 2015-03-27 DIAGNOSIS — M542 Cervicalgia: Secondary | ICD-10-CM | POA: Diagnosis not present

## 2015-03-27 DIAGNOSIS — G43719 Chronic migraine without aura, intractable, without status migrainosus: Secondary | ICD-10-CM

## 2015-03-27 DIAGNOSIS — R030 Elevated blood-pressure reading, without diagnosis of hypertension: Secondary | ICD-10-CM

## 2015-03-27 MED ORDER — CYCLOBENZAPRINE HCL 10 MG PO TABS
10.0000 mg | ORAL_TABLET | Freq: Three times a day (TID) | ORAL | Status: DC | PRN
Start: 2015-03-27 — End: 2015-04-01

## 2015-03-27 NOTE — Progress Notes (Signed)
NEUROLOGY FOLLOW UP OFFICE NOTE  LAURIEANNE GALLOWAY [id number]  HISTORY OF PRESENT ILLNESS: Betty Hurst is a 59 year old right-handed female with tobacco abuse who follows up for migraine and neck pain.  UPDATE: She is six weeks status post first round of Botox.  There has been no significant change in headaches.  They are still constant.  She notes increased pain in the neck and shoulders, especially on the right.  Tizanidine causes GI upset.  HISTORY: Onset:  She has history of migraines since at least 1991.  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 Location:  Bi-frontal and back of head and neck Quality:  Pounding.  She has allodynia involving her forehead. Initial intensity:  8-9/10 Aura:  no Prodrome:  no Associated symptoms:  Sometimes associated with nausea, vomiting, photophobia, phonophobia, osmophobia, blurred vision (about 1-2 times a month) Initial Duration:  constant Initial Frequency:  constant Triggers/exacerbating factors:  none Relieving factors:  none Activity:  Able to function  Past abortive medication:  Toradol, tylenol, excedrin, advil, sumatriptan Past preventative medication:  Amitriptyline, topiramate, depakote, nortriptyline, sertraline, methadone Other past therapy:  Botox (effective), acupuncture  Current abortive medication:  none Current preventative medication:  Tramadol 25mg  twice daily  Caffeine:  1 can of diet coke daily Alcohol:  occasionally Smoker:  yes Diet:  Eats healthy (no fried foods, eats fresh vegetables, grills) Exercise:  walks Depression/stress:  no Sleep hygiene:  poor Family history of headache:  no  PAST MEDICAL HISTORY: Past Medical History  Diagnosis Date  . Small bowel obstruction ([place])     Surgery in 2006  . Headache     MEDICATIONS: Current Outpatient Prescriptions on File Prior to Visit  Medication Sig Dispense  Refill  . brompheniramine-pseudoephedrine-DM 30-2-10 MG/5ML syrup Take by mouth.    . traMADol (ULTRAM) 50 MG tablet TAKE 1/2 - 1 TABLET BY MOUTH DAILY FOR HEADACHE PREVENTION 30 tablet 0   No current facility-administered medications on file prior to visit.    ALLERGIES: Allergies  Allergen Reactions  . Influenza Vaccines Hives  . Morphine Hives and Swelling  . Cefuroxime Axetil Rash    FAMILY HISTORY: Family History  Problem Relation Age of Onset  . Stroke Father     Deceased  . Renal Disease Paternal Grandfather   . Heart failure Paternal Grandfather     SOCIAL HISTORY: Social History   Social History  . Marital Status: Married    Spouse Name: N/A  . Number of Children: N/A  . Years of Education: N/A   Occupational History  . Not on file.   Social History Main Topics  . Smoking status: Current Every Day Smoker -- 0.50 packs/day    Types: Cigarettes  . Smokeless tobacco: Never Used     Comment: patient is aware that she needs to quit   . Alcohol Use: 0.0 oz/week    0 Standard drinks or equivalent per week     Comment: occ  . Drug Use: No  . Sexual Activity:    Partners: Male   Other Topics Concern  . Not on file   Social History Narrative   Married.   Lives in [place].   2 daughters, 3 grandchildren.   On disability from work.   Enjoys being outside, gardening, volunteers at [employer].    REVIEW OF SYSTEMS: Constitutional: No fevers, chills, or sweats, no generalized fatigue, change in appetite  Eyes: No visual changes, double vision, eye pain Ear, nose and throat: No hearing loss, ear pain, nasal congestion, sore throat Cardiovascular: No chest pain, palpitations Respiratory:  No shortness of breath at rest or with exertion, wheezes GastrointestinaI: No nausea, vomiting, diarrhea, abdominal pain, fecal incontinence Genitourinary:  No dysuria, urinary retention or frequency Musculoskeletal:  Neck and shoulder pain Integumentary: No  rash, pruritus, skin lesions Neurological: as above Psychiatric: No depression, insomnia, anxiety Endocrine: No palpitations, fatigue, diaphoresis, mood swings, change in appetite, change in weight, increased thirst Hematologic/Lymphatic:  No anemia, purpura, petechiae. Allergic/Immunologic: no itchy/runny eyes, nasal congestion, recent allergic reactions, rashes  PHYSICAL EXAM: Filed Vitals:   03/27/15 1412  BP: 158/80  Pulse: 90   General: No acute distress.  Patient appears well-groomed.  Head:  Normocephalic/atraumatic Eyes:  Fundoscopic exam unremarkable without vessel changes, exudates, hemorrhages or papilledema. Neck: supple, bilateral tendnerness, full range of motion Heart:  Regular rate and rhythm Lungs:  Clear to auscultation bilaterally Back: No paraspinal tenderness Neurological Exam: alert and oriented to person, place, and time. Attention span and concentration intact, recent and remote memory intact, fund of knowledge intact.  Speech fluent and not dysarthric, language intact.  CN II-XII intact. Fundoscopic exam unremarkable without vessel changes, exudates, hemorrhages or papilledema.  Bulk and tone normal, muscle strength 5/5 throughout.  Sensation to light touch, temperature and vibration intact.  Deep tendon reflexes 2+ throughout, toes downgoing.  Finger to nose and heel to shin testing intact.  Gait normal, Romberg negative.  IMPRESSION: Chronic migraine.  Discussed that often patients may not note benefits until after second round of Botox. Myofascial neck and shoulder pain Tobacco use Elevated blood pressure  PLAN: 1.  Second round of Botox in 6 weeks 2.  Cyclobenzaprine 10mg  for shoulder pain (caution for drowsiness) 3.  Smoking cessation 4.  Blood pressure should be re-evaluated by PCP  15 minutes spent face to face with patient, over 50% spent discussing management and expectations of Botox.  Metta Clines, DO  CC:  Alma Friendly, NP

## 2015-03-27 NOTE — Patient Instructions (Signed)
For neck and shoulder pain, try cyclobenzaprine 10mg  at bedtime as needed.  You may take up to three times daily as needed.  Caution for drowsiness Follow up in November for Botox

## 2015-04-01 ENCOUNTER — Encounter: Payer: Self-pay | Admitting: Radiology

## 2015-04-01 ENCOUNTER — Ambulatory Visit (INDEPENDENT_AMBULATORY_CARE_PROVIDER_SITE_OTHER): Payer: Medicare Other | Admitting: Primary Care

## 2015-04-01 ENCOUNTER — Encounter: Payer: Self-pay | Admitting: Primary Care

## 2015-04-01 VITALS — BP 146/78 | HR 82 | Temp 98.0°F | Ht 61.0 in | Wt 124.1 lb

## 2015-04-01 DIAGNOSIS — G43719 Chronic migraine without aura, intractable, without status migrainosus: Secondary | ICD-10-CM | POA: Diagnosis not present

## 2015-04-01 DIAGNOSIS — R05 Cough: Secondary | ICD-10-CM | POA: Diagnosis not present

## 2015-04-01 DIAGNOSIS — R059 Cough, unspecified: Secondary | ICD-10-CM

## 2015-04-01 MED ORDER — AZITHROMYCIN 250 MG PO TABS: ORAL_TABLET | ORAL | Status: DC

## 2015-04-01 NOTE — Patient Instructions (Addendum)
Start Azithromycin antibiotics. Take 2 tablets by mouth today, then 1 tablet by mouth daily for 4 additional days.  Try taking Delsym OTC for cough. Please call me if no improvement in cough and symptoms once antibiotic is complete.  Please schedule a follow up appointment in 3 months for re-evaluation of headaches.  It was a pleasure to see you today!

## 2015-04-01 NOTE — Assessment & Plan Note (Signed)
Chronic headaches for years.  Currently undergoing treatment with Dr. Tomi Likens with [facility] neurology.  Overall noticeable difference with botox injections, however she's only completed 1 round. Will continue tramadol as this is her only relief at this point. UDS due today. Follow up in 3 months.

## 2015-04-01 NOTE — Progress Notes (Signed)
Subjective:    Patient ID: Betty Hurst, female    DOB: December 02, 1955, 59 y.o.   MRN: [id number]  HPI  Betty Hurst is a 59 year old female who presents today for follow up of chronic headaches. She has a longstanding history of headaches and is currently undergoing treatment with Dr. Loretta Plume with neurology. She is undergoing botox injections with the last injection being on August 17th 2016 and is due again in November. She is also managed on tramadol 1/2-1 tablet daily as needed for headache management.   Since she's started her botox injections she's not noticed much difference in her headaches but has been told that it may take 6-12 months. She feels good relief with tramadol  2) Cough: Present for the past 2 weeks with postnasal drip and sinus pressure. Denies fevers, chills, fatigue. Last week she had a sore throat. Overall her cough has not improved. She's taken benadryl at night which helps to get to sleep. She's tried Robitussin without relief.   Review of Systems  Constitutional: Positive for fever and chills. Negative for fatigue.  HENT: Positive for sinus pressure. Negative for congestion, ear pain, rhinorrhea and sore throat.   Respiratory: Positive for cough. Negative for shortness of breath.   Cardiovascular: Negative for chest pain.  Musculoskeletal: Negative for myalgias.  Neurological: Positive for headaches.       Past Medical History  Diagnosis Date  . Small bowel obstruction ([place])     Surgery in 2006  . Headache     Social History   Social History  . Marital Status: Married    Spouse Name: N/A  . Number of Children: N/A  . Years of Education: N/A   Occupational History  . Not on file.   Social History Main Topics  . Smoking status: Current Every Day Smoker -- 0.50 packs/day    Types: Cigarettes  . Smokeless tobacco: Never Used     Comment: patient is aware that she needs to quit   . Alcohol Use: 0.0 oz/week    0 Standard drinks or equivalent per week       Comment: occ  . Drug Use: No  . Sexual Activity:    Partners: Male   Other Topics Concern  . Not on file   Social History Narrative   Married.   Lives in [place].   2 daughters, 3 grandchildren.   On disability from work.   Enjoys being outside, gardening, volunteers at [employer].    Past Surgical History  Procedure Laterality Date  . Nasal sinus surgery      14 surgeries  . Small intestine surgery      Small bowel obstruction in 2006  . Tonsillectomy    . Hemorroidectomy    . Other surgical history      Bladder sling    Family History  Problem Relation Age of Onset  . Stroke Father     Deceased  . Renal Disease Paternal Grandfather   . Heart failure Paternal Grandfather     Allergies  Allergen Reactions  . Influenza Vaccines Hives  . Morphine Hives and Swelling  . Cefuroxime Axetil Rash    Current Outpatient Prescriptions on File Prior to Visit  Medication Sig Dispense Refill  . traMADol (ULTRAM) 50 MG tablet TAKE 1/2 - 1 TABLET BY MOUTH DAILY FOR HEADACHE PREVENTION 30 tablet 0   No current facility-administered medications on file prior to visit.    BP 146/78 mmHg  Pulse 82  Temp(Src) 98 F (36.7 C) (Oral)  Ht 5\' 1"  (1.549 m)  Wt 124 lb 1.9 oz (56.3 kg)  BMI 23.46 kg/m2  SpO2 99%    Objective:   Physical Exam  Constitutional: She appears well-nourished.  HENT:  Right Ear: Tympanic membrane and ear canal normal.  Left Ear: Tympanic membrane and ear canal normal.  Nose: Right sinus exhibits maxillary sinus tenderness and frontal sinus tenderness. Left sinus exhibits maxillary sinus tenderness and frontal sinus tenderness.  Mouth/Throat: Oropharynx is clear and moist.  Eyes: Conjunctivae are normal. Pupils are equal, round, and reactive to light.  Neck: Neck supple.  Cardiovascular: Normal rate and regular rhythm.   Pulmonary/Chest: Effort normal and breath sounds normal.  Lymphadenopathy:    She has no cervical  adenopathy.  Skin: Skin is warm and dry.          Assessment & Plan:  Cough/Sinusitis:  Present x 2 weeks with moderate tenderness and pressure to sinuses. Also postnasal drip, and sore throat. Very tender to sinuses upon exam today. Due to presentation and duration of cough will send in RX for Zpak. She reports that Zpack's work the best for her symptoms in the past. She's had numerous sinus surgeries. Discussed supportive treatment with Delsym, antihistamines, water, rest.

## 2015-04-22 ENCOUNTER — Other Ambulatory Visit: Payer: Self-pay | Admitting: Primary Care

## 2015-04-22 NOTE — Telephone Encounter (Signed)
Electronically refill request for   traMADol (ULTRAM) 50 MG tablet   TAKE 1/2 - 1 TABLET BY MOUTH DAILY FOR HEADACHE PREVENTION  Dispense: 30 tablet   Refills: 0     Last prescribed on  03/25/2015. :ast seen on 04/01/2015. Next appointment on 07/02/2015.

## 2015-04-23 ENCOUNTER — Encounter: Payer: Self-pay | Admitting: Primary Care

## 2015-04-23 NOTE — Telephone Encounter (Signed)
[facility]

## 2015-05-14 ENCOUNTER — Ambulatory Visit: Payer: Medicare Other | Admitting: Neurology

## 2015-07-02 ENCOUNTER — Ambulatory Visit: Payer: Medicare Other | Admitting: Primary Care

## 2015-07-10 ENCOUNTER — Ambulatory Visit (INDEPENDENT_AMBULATORY_CARE_PROVIDER_SITE_OTHER)
Admission: RE | Admit: 2015-07-10 | Discharge: 2015-07-10 | Disposition: A | Discharge status: Home / Self Care | Payer: Medicare Other | Source: Ambulatory Visit | Attending: Primary Care | Admitting: Primary Care

## 2015-07-10 ENCOUNTER — Ambulatory Visit (INDEPENDENT_AMBULATORY_CARE_PROVIDER_SITE_OTHER): Payer: Medicare Other | Admitting: Primary Care

## 2015-07-10 VITALS — BP 124/82 | HR 84 | Temp 97.9°F | Ht 61.0 in | Wt 125.8 lb

## 2015-07-10 DIAGNOSIS — R05 Cough: Secondary | ICD-10-CM

## 2015-07-10 DIAGNOSIS — G43719 Chronic migraine without aura, intractable, without status migrainosus: Secondary | ICD-10-CM | POA: Diagnosis not present

## 2015-07-10 DIAGNOSIS — R059 Cough, unspecified: Secondary | ICD-10-CM

## 2015-07-10 MED ORDER — TRAMADOL HCL 50 MG PO TABS: ORAL_TABLET | ORAL | Status: DC

## 2015-07-10 NOTE — Progress Notes (Signed)
[facility] 

## 2015-07-10 NOTE — Progress Notes (Signed)
Subjective:    Patient ID: Betty Hurst, female    DOB: 13-Aug-1955, 60 y.o.   MRN: [id number]  HPI  Betty Hurst is a 60 year old female who presents today for follow up.  1) Chronic Headaches: Ongoing for years. She is currently undergoing treatment with Dr. Tomi Likens with [facility] for botox injections. She is also managed on Tramadol daily for headaches. She's had several rounds of injections and cannot afford the treatments any longer. Her last injection was in August 2016. Her next appointment is scheduled for February 2017. She didn't believe the injections worked to improve her headaches. She is interested in participating in a study for chronic headaches and plans to speak with Neurology regarding this.   2) Cough: She also reports chest congestion. Denies fevers, sore throat. Present since about Christmas 2016. She's taken benadryl, decongestants, and Robitussin without improvement. Her cough is most bothersome in the morning and at night. She does endorse acid reflux/fullness to her chest wall occasionally. She is a current smoker and has for many years. Denies hemoptysis, night sweats, unexplained weight loss. Overall she's feeling well, except for the cough.   Review of Systems  Constitutional: Negative for fever, chills and fatigue.  HENT: Positive for congestion. Negative for ear pain, sinus pressure and sore throat.   Respiratory: Positive for cough. Negative for shortness of breath and wheezing.   Cardiovascular: Negative for chest pain.  Musculoskeletal: Negative for myalgias.  Neurological: Positive for headaches. Negative for dizziness.       Past Medical History  Diagnosis Date  . Small bowel obstruction ([place])     Surgery in 2006  . Headache     Social History   Social History  . Marital Status: Married    Spouse Name: N/A  . Number of Children: N/A  . Years of Education: N/A   Occupational History  . Not on file.   Social History Main Topics  .  Smoking status: Current Every Day Smoker -- 0.50 packs/day    Types: Cigarettes  . Smokeless tobacco: Never Used     Comment: patient is aware that she needs to quit   . Alcohol Use: 0.0 oz/week    0 Standard drinks or equivalent per week     Comment: occ  . Drug Use: No  . Sexual Activity:    Partners: Male   Other Topics Concern  . Not on file   Social History Narrative   Married.   Lives in [place].   2 daughters, 3 grandchildren.   On disability from work.   Enjoys being outside, gardening, volunteers at [employer].    Past Surgical History  Procedure Laterality Date  . Nasal sinus surgery      14 surgeries  . Small intestine surgery      Small bowel obstruction in 2006  . Tonsillectomy    . Hemorroidectomy    . Other surgical history      Bladder sling    Family History  Problem Relation Age of Onset  . Stroke Father     Deceased  . Renal Disease Paternal Grandfather   . Heart failure Paternal Grandfather     Allergies  Allergen Reactions  . Influenza Vaccines Hives  . Morphine Hives and Swelling  . Cefuroxime Axetil Rash    Current Outpatient Prescriptions on File Prior to Visit  Medication Sig Dispense Refill  . traMADol (ULTRAM) 50 MG tablet TAKE 1/2-1 TABLET BY MOUTH DAILY FOR HEADACHE PREVENTION  30 tablet 2   No current facility-administered medications on file prior to visit.    BP 124/82 mmHg  Pulse 84  Temp(Src) 97.9 F (36.6 C) (Oral)  Ht 5\' 1"  (1.549 m)  Wt 125 lb 12.8 oz (57.063 kg)  BMI 23.78 kg/m2  SpO2 98%    Objective:   Physical Exam  Constitutional: She appears well-nourished.  HENT:  Right Ear: Tympanic membrane and ear canal normal.  Left Ear: Tympanic membrane and ear canal normal.  Nose: Right sinus exhibits frontal sinus tenderness. Right sinus exhibits no maxillary sinus tenderness. Left sinus exhibits frontal sinus tenderness. Left sinus exhibits no maxillary sinus tenderness.  Mouth/Throat:  Oropharynx is clear and moist.  Cardiovascular: Normal rate and regular rhythm.   Pulmonary/Chest: Effort normal and breath sounds normal. She has no wheezes. She has no rales.  Neurological: No cranial nerve deficit.  Skin: Skin is warm and dry.          Assessment & Plan:  Cough:  Present intermittently since October 2016, no improvement with antibiotic treatment then. Occasional acid reflux with certain foods. Exam unremarkable. Does not appear ill. Could be reflux, could be COPD from smoking.  Will obtain chest xray to rule out other abnormalities given smoking history.  Will also trial pepcid daily for 4 weeks. She is to update me if no improvement. Will consider PFT's next visit if no improvement.

## 2015-07-10 NOTE — Addendum Note (Signed)
Addended by: Jacqualin Combes on: 07/10/2015 03:30 PM   Modules accepted: Orders

## 2015-07-10 NOTE — Patient Instructions (Addendum)
Complete xray(s) prior to leaving today. I will notify you of your results once received.  I believe your cough is related to your smoking or acid reflux. Try taking a trial of Pepcid daily for the next couple of weeks.  Please call me if no improvement in cough in a few weeks.  Follow up in June 2017 for repeat physical.  It was a pleasure to see you today!

## 2015-07-10 NOTE — Progress Notes (Signed)
Pre visit review using our clinic review tool, if applicable. No additional management support is needed unless otherwise documented below in the visit note. 

## 2015-07-10 NOTE — Assessment & Plan Note (Signed)
Continues to follow with neurology, doesn't feel as though botox injections helped with headaches. Will continue tramadol at this point but discussed that she should continue evaluation with neurology to get to the root of the problem. Next appointment with neurology in February 2017.

## 2015-07-11 ENCOUNTER — Telehealth: Payer: Self-pay | Admitting: Primary Care

## 2015-07-11 NOTE — Telephone Encounter (Signed)
Pt returned call re labs  [phone]

## 2015-07-12 NOTE — Telephone Encounter (Signed)
Called and notified patient of Kate's comments. Patient verbalized understanding.  

## 2015-07-16 ENCOUNTER — Ambulatory Visit (INDEPENDENT_AMBULATORY_CARE_PROVIDER_SITE_OTHER): Payer: Medicare Other | Admitting: *Deleted

## 2015-07-16 DIAGNOSIS — R059 Cough, unspecified: Secondary | ICD-10-CM

## 2015-07-16 DIAGNOSIS — R05 Cough: Secondary | ICD-10-CM

## 2015-07-16 MED ORDER — ALBUTEROL SULFATE (2.5 MG/3ML) 0.083% IN NEBU: 2.5000 mg | INHALATION_SOLUTION | Freq: Once | RESPIRATORY_TRACT | Status: DC

## 2015-07-17 ENCOUNTER — Other Ambulatory Visit: Payer: Self-pay | Admitting: Primary Care

## 2015-07-17 DIAGNOSIS — J439 Emphysema, unspecified: Secondary | ICD-10-CM

## 2015-07-17 MED ORDER — ALBUTEROL SULFATE HFA 108 (90 BASE) MCG/ACT IN AERS: 1.0000 | INHALATION_SPRAY | Freq: Four times a day (QID) | RESPIRATORY_TRACT | Status: DC | PRN

## 2015-07-17 MED ORDER — FLUTICASONE-SALMETEROL 100-50 MCG/DOSE IN AEPB: 1.0000 | INHALATION_SPRAY | Freq: Two times a day (BID) | RESPIRATORY_TRACT | Status: DC

## 2015-07-18 ENCOUNTER — Telehealth: Payer: Self-pay | Admitting: Primary Care

## 2015-07-18 ENCOUNTER — Other Ambulatory Visit: Payer: Self-pay | Admitting: Primary Care

## 2015-07-18 DIAGNOSIS — J439 Emphysema, unspecified: Secondary | ICD-10-CM

## 2015-07-18 DIAGNOSIS — R05 Cough: Secondary | ICD-10-CM

## 2015-07-18 DIAGNOSIS — R059 Cough, unspecified: Secondary | ICD-10-CM

## 2015-07-18 MED ORDER — ALBUTEROL SULFATE HFA 108 (90 BASE) MCG/ACT IN AERS: 2.0000 | INHALATION_SPRAY | Freq: Four times a day (QID) | RESPIRATORY_TRACT | Status: DC | PRN

## 2015-07-18 MED ORDER — BUDESONIDE-FORMOTEROL FUMARATE 80-4.5 MCG/ACT IN AERO: 2.0000 | INHALATION_SPRAY | Freq: Two times a day (BID) | RESPIRATORY_TRACT | Status: DC

## 2015-07-18 MED ORDER — PREDNISONE 10 MG PO TABS: ORAL_TABLET | ORAL | Status: DC

## 2015-07-18 NOTE — Progress Notes (Signed)
Pt left v/m; pt went to [facility] and the 2 inhalers with coupon was > $300.00. Pt request different inhalers sent to [facility]. Pt understood a steroid was going to be prescribed and [facility] did not have steroid rx. Pt request cb [phone].

## 2015-07-18 NOTE — Telephone Encounter (Signed)
Please notify Betty Hurst that [employer] sent in 2 different inhalers. She will use Symbicort every day. She will do 2 puffs twice daily. The albuterol should be used as needed for any shortness of breath. I've also sent in prednisone. She will take 3 tablets for 2 days, then 2 for 2 days, then 1 for 2 days.  Please have her update me soon. Thanks.

## 2015-07-19 NOTE — Telephone Encounter (Signed)
Message left for patient to return my call.  

## 2015-07-19 NOTE — Telephone Encounter (Signed)
Pt returned your call.  

## 2015-07-19 NOTE — Telephone Encounter (Signed)
Pt notified of Kate's comments and verbalized understanding

## 2015-08-12 ENCOUNTER — Encounter: Payer: Self-pay | Admitting: Primary Care

## 2015-08-12 ENCOUNTER — Ambulatory Visit (INDEPENDENT_AMBULATORY_CARE_PROVIDER_SITE_OTHER): Payer: Medicare Other | Admitting: Primary Care

## 2015-08-12 VITALS — BP 112/66 | HR 88 | Temp 97.9°F | Ht 61.0 in | Wt 126.8 lb

## 2015-08-12 DIAGNOSIS — J439 Emphysema, unspecified: Secondary | ICD-10-CM | POA: Insufficient documentation

## 2015-08-12 MED ORDER — BUDESONIDE-FORMOTEROL FUMARATE 160-4.5 MCG/ACT IN AERO: 2.0000 | INHALATION_SPRAY | Freq: Two times a day (BID) | RESPIRATORY_TRACT | Status: DC

## 2015-08-12 NOTE — Progress Notes (Signed)
Subjective:    Patient ID: Betty Hurst, female    DOB: [birth year], 60 y.o.   MRN: [id number]  HPI  Betty Hurst is a 60 year old female who presents today for follow up COPD. She was evaluated in January for complaints of cough that had been ongoing for months. She was not determined to have bacterial involvement or GERD. Spirometry was completed which showed moderate obstruction to her airways. She was initiated on Synbicort 80-4.5 mcg one month ago, and provided with an albuterol inhaler to use PRN.  Since her last visit her cough has improved significantly. She continues to cough some everyday, mostly at night. Cough is non productive. She did notice some wheezing this morning as the pollen is heavy. Denies chest pain and exertional shortness of breath. She's not had to use the albuterol inhaler. Overall she's feeling improved.   Review of Systems  Constitutional: Negative for fever, chills and fatigue.  Respiratory: Positive for cough and wheezing. Negative for shortness of breath.   Cardiovascular: Negative for chest pain.       Past Medical History  Diagnosis Date  . Small bowel obstruction ([place])     Surgery in 2006  . Headache     Social History   Social History  . Marital Status: Married    Spouse Name: N/A  . Number of Children: N/A  . Years of Education: N/A   Occupational History  . Not on file.   Social History Main Topics  . Smoking status: Current Every Day Smoker -- 0.50 packs/day    Types: Cigarettes  . Smokeless tobacco: Never Used     Comment: patient is aware that she needs to quit   . Alcohol Use: 0.0 oz/week    0 Standard drinks or equivalent per week     Comment: occ  . Drug Use: No  . Sexual Activity:    Partners: Male   Other Topics Concern  . Not on file   Social History Narrative   Married.   Lives in [place].   2 daughters, 3 grandchildren.   On disability from work.   Enjoys being outside, gardening, volunteers at [employer].    Past Surgical History  Procedure Laterality Date  . Nasal sinus surgery      14 surgeries  . Small intestine surgery      Small bowel obstruction in 2006  . Tonsillectomy    . Hemorroidectomy    . Other surgical history      Bladder sling    Family History  Problem Relation Age of Onset  . Stroke Father     Deceased  . Renal Disease Paternal Grandfather   . Heart failure Paternal Grandfather     Allergies  Allergen Reactions  . Influenza Vaccines Hives  . Morphine Hives and Swelling  . Cefuroxime Axetil Rash    Current Outpatient Prescriptions on File Prior to Visit  Medication Sig Dispense Refill  . albuterol (PROVENTIL HFA;VENTOLIN HFA) 108 (90 Base) MCG/ACT inhaler Inhale 2 puffs into the lungs every 6 (six) hours as needed for wheezing or shortness of breath. 1 Inhaler 2  . traMADol (ULTRAM) 50 MG tablet TAKE 1/2-1 TABLET BY MOUTH DAILY FOR HEADACHE PREVENTION 30 tablet 2   No current facility-administered medications on file prior to visit.    BP 112/66 mmHg  Pulse 88  Temp(Src) 97.9 F (36.6 C) (Oral)  Ht 5\' 1"  (1.549 m)  Wt 126 lb 12.8 oz (57.516 kg)  BMI 23.97 kg/m2  SpO2 95%    Objective:   Physical Exam  Constitutional: She appears well-nourished.  Cardiovascular: Normal rate and regular rhythm.   Pulmonary/Chest: Effort normal and breath sounds normal. She has no wheezes. She has no rales.  Skin: Skin is warm and dry.          Assessment & Plan:

## 2015-08-12 NOTE — Assessment & Plan Note (Signed)
Spirometry from January with moderate/severe obstruction with improvement post bronchodilation. Started on symbicort 80-4.5 mcg with some improvement. Will increase to 160-4.5 due to daily cough, mostly at night. She is to report back in 1 month with an update. Will continue to monitor.

## 2015-08-12 NOTE — Progress Notes (Signed)
Pre visit review using our clinic review tool, if applicable. No additional management support is needed unless otherwise documented below in the visit note. 

## 2015-08-12 NOTE — Patient Instructions (Signed)
We've increased your dose of Symbicort from 80-4.5 mcg to 160-4.5 mcg. Inhale 2 puffs into your lungs twice daily.  Please send me a My Chart message in 1 month with an update.  Follow up in June as scheduled.  It was a pleasure to see you today!

## 2015-09-09 ENCOUNTER — Telehealth: Payer: Self-pay | Admitting: Primary Care

## 2015-09-09 NOTE — Telephone Encounter (Signed)
Message left for patient to return my call.  

## 2015-09-09 NOTE — Telephone Encounter (Signed)
Will you check on Betty Hurst? How's she doing since we increased her Symbicort dose?

## 2015-09-10 NOTE — Telephone Encounter (Signed)
Message left for patient to return my call.  

## 2015-09-12 NOTE — Telephone Encounter (Signed)
Left message on patient's voicemail to return call

## 2015-09-13 NOTE — Telephone Encounter (Signed)
Left message on patient's voicemail to return call

## 2015-09-17 NOTE — Telephone Encounter (Signed)
Will close encounter since attempt numerous times.

## 2015-10-17 ENCOUNTER — Telehealth: Payer: Self-pay

## 2015-10-17 DIAGNOSIS — J439 Emphysema, unspecified: Secondary | ICD-10-CM

## 2015-10-17 MED ORDER — BUDESONIDE-FORMOTEROL FUMARATE 80-4.5 MCG/ACT IN AERO: 2.0000 | INHALATION_SPRAY | Freq: Two times a day (BID) | RESPIRATORY_TRACT | Status: DC

## 2015-10-17 NOTE — Telephone Encounter (Signed)
Message left for patient to return my call.  

## 2015-10-17 NOTE — Telephone Encounter (Signed)
Noted. Will send lower dose. Please have her try the lower dose and report back if this is not enough to control her symptoms. It may be enough now.

## 2015-10-17 NOTE — Telephone Encounter (Signed)
Pt left v/m; symbicort 160-4.5 mcg dose was given and pt had a coupon (but coupon was only good for one refill) now cost to pt is over $100.00 and pt request symbicort dose be reduced to lower dose; the lower dose cost to pt is $45.00. [facility]. Pt request cb.

## 2015-10-18 NOTE — Telephone Encounter (Signed)
Message left for patient to return my call.  

## 2015-10-18 NOTE — Telephone Encounter (Signed)
Will close encounter since patient did not respond.

## 2015-10-22 ENCOUNTER — Other Ambulatory Visit: Payer: Self-pay | Admitting: Primary Care

## 2015-10-22 DIAGNOSIS — G8929 Other chronic pain: Secondary | ICD-10-CM

## 2015-10-22 DIAGNOSIS — R51 Headache: Principal | ICD-10-CM

## 2015-10-23 NOTE — Telephone Encounter (Signed)
[facility]

## 2015-10-23 NOTE — Telephone Encounter (Signed)
Electronically refill request for   traMADol (ULTRAM) 50 MG tablet   TAKE 1/2-1 TABLET BY MOUTH DAILY FOR HEADACHE PREVENTION  Dispense: 30 tablet   Refills: 2     Last prescribed on 07/10/2015. Last seen on 08/12/2015. CPE on 12/09/2015.

## 2015-10-30 NOTE — Telephone Encounter (Signed)
Pt called back i let her know she would need to call insurance company to name of less expensive substitue

## 2015-10-30 NOTE — Telephone Encounter (Signed)
Pt left v/m; when pt went to pick up lower dose of symbicort the co pay had gone from $45.00 to $215.00. Pt request less expensive inhaler. Left v/m requesting pt to cb; pt will need to contact ins co to get name of less expensive substitute.

## 2015-11-26 ENCOUNTER — Other Ambulatory Visit: Payer: Self-pay | Admitting: Primary Care

## 2015-11-26 DIAGNOSIS — Z1159 Encounter for screening for other viral diseases: Secondary | ICD-10-CM

## 2015-11-26 DIAGNOSIS — E785 Hyperlipidemia, unspecified: Secondary | ICD-10-CM

## 2015-11-26 DIAGNOSIS — E559 Vitamin D deficiency, unspecified: Secondary | ICD-10-CM

## 2015-12-02 ENCOUNTER — Other Ambulatory Visit (INDEPENDENT_AMBULATORY_CARE_PROVIDER_SITE_OTHER): Payer: Medicare Other

## 2015-12-02 DIAGNOSIS — E785 Hyperlipidemia, unspecified: Secondary | ICD-10-CM | POA: Diagnosis not present

## 2015-12-02 DIAGNOSIS — Z1159 Encounter for screening for other viral diseases: Secondary | ICD-10-CM

## 2015-12-02 DIAGNOSIS — E559 Vitamin D deficiency, unspecified: Secondary | ICD-10-CM

## 2015-12-02 LAB — LIPID PANEL
CHOL/HDL RATIO: 2
Cholesterol: 183 mg/dL (ref 0–200)
HDL: 88.7 mg/dL (ref >39.00)
LDL CALC: 80 mg/dL (ref 0–99)
NONHDL: 94.65
Triglycerides: 72 mg/dL (ref 0.0–149.0)
VLDL: 14.4 mg/dL (ref 0.0–40.0)

## 2015-12-02 LAB — COMPREHENSIVE METABOLIC PANEL
ALK PHOS: 51 U/L (ref 39–117)
ALT: 13 U/L (ref 0–35)
AST: 20 U/L (ref 0–37)
Albumin: 4.3 g/dL (ref 3.5–5.2)
BUN: 9 mg/dL (ref 6–23)
CHLORIDE: 97 meq/L (ref 96–112)
CO2: 26 meq/L (ref 19–32)
Calcium: 9.2 mg/dL (ref 8.4–10.5)
Creatinine, Ser: 0.74 mg/dL (ref 0.40–1.20)
GFR: 85.77 mL/min (ref >60.00)
GLUCOSE: 76 mg/dL (ref 70–99)
POTASSIUM: 4 meq/L (ref 3.5–5.1)
SODIUM: 131 meq/L — AB (ref 135–145)
Total Bilirubin: 0.4 mg/dL (ref 0.2–1.2)
Total Protein: 6.9 g/dL (ref 6.0–8.3)

## 2015-12-02 LAB — VITAMIN D 25 HYDROXY (VIT D DEFICIENCY, FRACTURES): VITD: 33.48 ng/mL (ref 30.00–100.00)

## 2015-12-03 LAB — HEPATITIS C ANTIBODY: HCV AB: NEGATIVE

## 2015-12-09 ENCOUNTER — Ambulatory Visit (INDEPENDENT_AMBULATORY_CARE_PROVIDER_SITE_OTHER): Payer: Medicare Other | Admitting: Primary Care

## 2015-12-09 ENCOUNTER — Encounter: Payer: Medicare Other | Admitting: Primary Care

## 2015-12-09 ENCOUNTER — Encounter: Payer: Self-pay | Admitting: Primary Care

## 2015-12-09 VITALS — BP 122/76 | HR 86 | Temp 98.1°F | Ht 61.0 in | Wt 125.1 lb

## 2015-12-09 DIAGNOSIS — Z1239 Encounter for other screening for malignant neoplasm of breast: Secondary | ICD-10-CM | POA: Diagnosis not present

## 2015-12-09 DIAGNOSIS — Z0000 Encounter for general adult medical examination without abnormal findings: Secondary | ICD-10-CM | POA: Diagnosis not present

## 2015-12-09 DIAGNOSIS — E871 Hypo-osmolality and hyponatremia: Secondary | ICD-10-CM

## 2015-12-09 DIAGNOSIS — R51 Headache: Secondary | ICD-10-CM

## 2015-12-09 DIAGNOSIS — Z0001 Encounter for general adult medical examination with abnormal findings: Secondary | ICD-10-CM | POA: Insufficient documentation

## 2015-12-09 DIAGNOSIS — J439 Emphysema, unspecified: Secondary | ICD-10-CM

## 2015-12-09 DIAGNOSIS — G43719 Chronic migraine without aura, intractable, without status migrainosus: Secondary | ICD-10-CM

## 2015-12-09 DIAGNOSIS — G8929 Other chronic pain: Secondary | ICD-10-CM

## 2015-12-09 LAB — BASIC METABOLIC PANEL
BUN: 14 mg/dL (ref 6–23)
CO2: 30 mEq/L (ref 19–32)
CREATININE: 1.06 mg/dL (ref 0.40–1.20)
Calcium: 10.2 mg/dL (ref 8.4–10.5)
Chloride: 98 mEq/L (ref 96–112)
GFR: 56.65 mL/min — AB (ref >60.00)
GLUCOSE: 93 mg/dL (ref 70–99)
POTASSIUM: 4.7 meq/L (ref 3.5–5.1)
Sodium: 136 mEq/L (ref 135–145)

## 2015-12-09 MED ORDER — TRAMADOL HCL 50 MG PO TABS: ORAL_TABLET | ORAL | Status: DC

## 2015-12-09 NOTE — Assessment & Plan Note (Signed)
Immunizations up-to-date. Colonoscopy up-to-date, mammogram ordered. Labs unremarkable, exam unremarkable. Discussed to continue efforts on healthy lifestyle through exercise and diet. Discouraged frozen meals and salty soups. Follow-up in one year for repeat physical.

## 2015-12-09 NOTE — Progress Notes (Signed)
Pre visit review using our clinic review tool, if applicable. No additional management support is needed unless otherwise documented below in the visit note. 

## 2015-12-09 NOTE — Progress Notes (Signed)
Subjective:    Patient ID: SHANTALE ERICSSON, female    DOB: 04-05-1956, 60 y.o.   MRN: [id number]  HPI  Ms. Shelstad is a 60 year old female who presents today for complete physical.  Immunizations: -Tetanus: Completed 6 years ago -Influenza: Does not receive due to allergy -Pneumovax: Completed in 2016   Diet: Endorses a healthy diet. Breakfast: Yogurt, fruit snacks, crackers Lunch: Left overs, frozen meal, soup Dinner: Tacos, Grilled lean protein, some vegetables Snacks: Vegetables, fruit Desserts: Occasionally Beverages: Diet soda, powerade, vitamin water  Exercise: She walks and is active in the yard. Eye exam: Completed 2 years ago, due. Dental exam: Completed 2 years ago. Colonoscopy: Completed in 2014, due in 2024 Pap Smear: Completed in October 2015 Mammogram: Completed in October 2015, due.   Review of Systems  Constitutional: Negative for unexpected weight change.  HENT: Negative for rhinorrhea.   Respiratory: Negative for cough and shortness of breath.   Cardiovascular: Negative for chest pain.  Gastrointestinal: Negative for diarrhea and constipation.  Genitourinary: Negative for difficulty urinating.  Musculoskeletal: Negative for myalgias and arthralgias.  Skin: Negative for rash.  Allergic/Immunologic: Negative for environmental allergies.  Neurological: Positive for headaches. Negative for dizziness and numbness.  Psychiatric/Behavioral:       Denies concerns for anxiety or depression       Past Medical History  Diagnosis Date  . Small bowel obstruction ([place])     Surgery in 2006  . Headache      Social History   Social History  . Marital Status: Married    Spouse Name: N/A  . Number of Children: N/A  . Years of Education: N/A   Occupational History  . Not on file.   Social History Main Topics  . Smoking status: Current Every Day Smoker -- 0.50 packs/day    Types: Cigarettes  . Smokeless tobacco: Never Used     Comment: patient is aware  that she needs to quit   . Alcohol Use: 0.0 oz/week    0 Standard drinks or equivalent per week     Comment: occ  . Drug Use: No  . Sexual Activity:    Partners: Male   Other Topics Concern  . Not on file   Social History Narrative   Married.   Lives in [place].   2 daughters, 3 grandchildren.   On disability from work.   Enjoys being outside, gardening, volunteers at [employer].    Past Surgical History  Procedure Laterality Date  . Nasal sinus surgery      14 surgeries  . Small intestine surgery      Small bowel obstruction in 2006  . Tonsillectomy    . Hemorroidectomy    . Other surgical history      Bladder sling    Family History  Problem Relation Age of Onset  . Stroke Father     Deceased  . Renal Disease Paternal Grandfather   . Heart failure Paternal Grandfather     Allergies  Allergen Reactions  . Influenza Vaccines Hives  . Morphine Hives and Swelling  . Cefuroxime Axetil Rash    Current Outpatient Prescriptions on File Prior to Visit  Medication Sig Dispense Refill  . albuterol (PROVENTIL HFA;VENTOLIN HFA) 108 (90 Base) MCG/ACT inhaler Inhale 2 puffs into the lungs every 6 (six) hours as needed for wheezing or shortness of breath. 1 Inhaler 2  . budesonide-formoterol (SYMBICORT) 80-4.5 MCG/ACT inhaler Inhale 2 puffs into the lungs 2 (two) times daily.  1 Inhaler 5   No current facility-administered medications on file prior to visit.    BP 122/76 mmHg  Pulse 86  Temp(Src) 98.1 F (36.7 C) (Oral)  Ht 5\' 1"  (1.549 m)  Wt 125 lb 1.9 oz (56.754 kg)  BMI 23.65 kg/m2  SpO2 97%    Objective:   Physical Exam  Constitutional: She is oriented to person, place, and time. She appears well-nourished.  HENT:  Right Ear: Tympanic membrane and ear canal normal.  Left Ear: Tympanic membrane and ear canal normal.  Nose: Nose normal.  Mouth/Throat: Oropharynx is clear and moist.  Eyes: Conjunctivae and EOM are normal. Pupils are equal,  round, and reactive to light.  Neck: Neck supple. No thyromegaly present.  Cardiovascular: Normal rate and regular rhythm.   No murmur heard. Pulmonary/Chest: Effort normal and breath sounds normal. She has no rales.  Abdominal: Soft. Bowel sounds are normal. There is no tenderness.  Musculoskeletal: Normal range of motion.  Lymphadenopathy:    She has no cervical adenopathy.  Neurological: She is alert and oriented to person, place, and time. She has normal reflexes. No cranial nerve deficit.  Skin: Skin is warm and dry. No rash noted.  Psychiatric: She has a normal mood and affect.          Assessment & Plan:

## 2015-12-09 NOTE — Patient Instructions (Addendum)
Complete lab work prior to leaving today. I will notify you of your results once received.   Start Calcium with Vitamin D. You need 1200 mg of calcium and 800 units of vitamin D daily.  You will be contacted regarding your mammogram.  Please let us know if you have not heard back within one week.   Start taking Zyrtec or Claritin daily for allergy symptoms that are likely contributing to your cough.  Continue exercising. You should be getting 1 hour of moderate intensity exercise 5 days weekly.  Ensure you are consuming 64 ounces of water daily.  Follow up in 1 year for repeat physical or sooner if needed.  It was a pleasure to see you today!

## 2015-12-09 NOTE — Assessment & Plan Note (Signed)
Overall improvement in cough and shortness of breath on Symbicort. Uses albuterol very sparingly.

## 2015-12-09 NOTE — Assessment & Plan Note (Signed)
No improvement in headaches overall despite evaluation per neurology. No improvement with Botox injections. Refill provided for tramadol and we'll continue to provide as long as she does not require an increase in dose.

## 2015-12-10 ENCOUNTER — Encounter: Payer: Self-pay | Admitting: *Deleted

## 2016-01-22 ENCOUNTER — Telehealth: Payer: Self-pay

## 2016-01-22 NOTE — Telephone Encounter (Signed)
I highly encourage her to check with her insurance company regarding coverage prior to [place] ordering. Please let me know if they will cover.

## 2016-01-22 NOTE — Telephone Encounter (Signed)
Pt is presently using symbicort bid and pt has been reading about the anora inhaler that is a once a day med and pt would like to try the anora. Pt request cb. [facility]. Last seen 12/09/15.

## 2016-01-24 NOTE — Telephone Encounter (Signed)
Pt returned your call please cal back thanks

## 2016-01-27 NOTE — Telephone Encounter (Signed)
Message left for patient to return my call on 01/24/2016 and 01/27/2016.

## 2016-04-07 ENCOUNTER — Other Ambulatory Visit: Payer: Self-pay | Admitting: Primary Care

## 2016-04-07 DIAGNOSIS — R519 Headache, unspecified: Secondary | ICD-10-CM

## 2016-04-07 DIAGNOSIS — R51 Headache: Principal | ICD-10-CM

## 2016-04-08 NOTE — Telephone Encounter (Signed)
Ok to refill? Electronically refill request for   traMADol (ULTRAM) 50 MG tablet  TAKE 1/2-1 TABLET BY MOUTH DAILY FOR HEADACHE PREVENTION  Last prescribed and seen on 12/09/2015. No future appt.

## 2016-04-09 NOTE — Telephone Encounter (Signed)
Called in medication to the pharmacy as instructed. 

## 2016-04-23 ENCOUNTER — Telehealth: Payer: Self-pay

## 2016-04-23 NOTE — Telephone Encounter (Signed)
Pt called to see if due pneumonia shot. Had pneumovax in 2016. Pt will ck with ins co to see if prevnar 13 is covered. If pt wants to schedule pt will cb and will send request to [name] NP.

## 2016-06-24 ENCOUNTER — Ambulatory Visit (INDEPENDENT_AMBULATORY_CARE_PROVIDER_SITE_OTHER): Payer: Medicare Other | Admitting: Primary Care

## 2016-06-24 VITALS — BP 118/78 | HR 87 | Temp 97.7°F | Ht 61.0 in | Wt 127.8 lb

## 2016-06-24 DIAGNOSIS — J069 Acute upper respiratory infection, unspecified: Secondary | ICD-10-CM

## 2016-06-24 LAB — POC INFLUENZA A&B (BINAX/QUICKVUE)
INFLUENZA A, POC: NEGATIVE
Influenza B, POC: POSITIVE — AB

## 2016-06-24 MED ORDER — BENZONATATE 200 MG PO CAPS: 200.0000 mg | ORAL_CAPSULE | Freq: Three times a day (TID) | ORAL | 0 refills | Status: DC | PRN

## 2016-06-24 NOTE — Addendum Note (Signed)
Addended by: Jacqualin Combes on: 06/24/2016 01:19 PM   Modules accepted: Orders

## 2016-06-24 NOTE — Progress Notes (Signed)
Subjective:    Patient ID: Betty Hurst, female    DOB: Sep 23, 1955, 61 y.o.   MRN: [id number]  HPI  [name] is a 61 year old female who presents today with a chief complaint of cough. She also reports fever, body aches. Her symptoms began Saturday this past weekend (4 days ago) with body aches. Her fevers have been low grade ranging from 99-100 on average. She's taken ibuprofen, alka-selzter cold and cough with some improvement. She denies sick contacts. Her cough is productive with clear sputum.   Review of Systems  Constitutional: Positive for chills, fatigue and fever.  HENT: Positive for congestion, ear pain, sinus pressure and sore throat.   Respiratory: Positive for cough. Negative for shortness of breath and wheezing.   Cardiovascular: Negative for chest pain.       Past Medical History:  Diagnosis Date  . Headache   . Small bowel obstruction    Surgery in 2006     Social History   Social History  . Marital status: Married    Spouse name: N/A  . Number of children: N/A  . Years of education: N/A   Occupational History  . Not on file.   Social History Main Topics  . Smoking status: Current Every Day Smoker    Packs/day: 0.50    Types: Cigarettes  . Smokeless tobacco: Never Used     Comment: patient is aware that she needs to quit   . Alcohol use 0.0 oz/week     Comment: occ  . Drug use: No  . Sexual activity: Yes    Partners: Male   Other Topics Concern  . Not on file   Social History Narrative   Married.   Lives in [place].   2 daughters, 3 grandchildren.   On disability from work.   Enjoys being outside, gardening, volunteers at [employer].    Past Surgical History:  Procedure Laterality Date  . HEMORROIDECTOMY    . NASAL SINUS SURGERY     14 surgeries  . OTHER SURGICAL HISTORY     Bladder sling  . SMALL INTESTINE SURGERY     Small bowel obstruction in 2006  . TONSILLECTOMY      Family History  Problem Relation Age of  Onset  . Stroke Father     Deceased  . Renal Disease Paternal Grandfather   . Heart failure Paternal Grandfather     Allergies  Allergen Reactions  . Influenza Vaccines Hives  . Morphine Hives and Swelling  . Cefuroxime Axetil Rash    Current Outpatient Prescriptions on File Prior to Visit  Medication Sig Dispense Refill  . albuterol (PROVENTIL HFA;VENTOLIN HFA) 108 (90 Base) MCG/ACT inhaler Inhale 2 puffs into the lungs every 6 (six) hours as needed for wheezing or shortness of breath. 1 Inhaler 2  . budesonide-formoterol (SYMBICORT) 80-4.5 MCG/ACT inhaler Inhale 2 puffs into the lungs 2 (two) times daily. 1 Inhaler 5  . traMADol (ULTRAM) 50 MG tablet TAKE 1/2 TO 1 TABLET BY MOUTH DAILY FOR HEADACHE 90 tablet 0   No current facility-administered medications on file prior to visit.     BP 118/78   Pulse 87   Temp 97.7 F (36.5 C) (Oral)   Ht 5\' 1"  (1.549 m)   Wt 127 lb 12.8 oz (58 kg)   SpO2 96%   BMI 24.15 kg/m    Objective:   Physical Exam  Constitutional: She appears well-nourished.  HENT:  Right Ear: Tympanic membrane and  ear canal normal.  Left Ear: Tympanic membrane and ear canal normal.  Nose: Mucosal edema present. Right sinus exhibits maxillary sinus tenderness and frontal sinus tenderness. Left sinus exhibits maxillary sinus tenderness and frontal sinus tenderness.  Mouth/Throat: Oropharynx is clear and moist.  Eyes: Conjunctivae are normal.  Neck: Neck supple.  Cardiovascular: Normal rate and regular rhythm.   Pulmonary/Chest: Effort normal. She has no wheezes. She has no rales.  Lymphadenopathy:    She has no cervical adenopathy.  Skin: Skin is warm and dry.          Assessment & Plan:  URI:  Cough, congestion, fatigue, low grade fevers x 4 days. Improvement in symptoms with OTC treatment. Exam today without evidence of wheezing, rales, crackles. Vitals WNL, she is afebrile today. Rapid Flu: Negative Suspect viral URI and will treat with  supportive measures. Continue OTC meds, start Mucinex. Fluids, rest, follow up PRN.  Sheral Flow, NP

## 2016-06-24 NOTE — Patient Instructions (Addendum)
Your symptoms are representative of a viral illness which will resolve on its own over time. Our goal is to treat your symptoms in order to aid your body in the healing process and to make you more comfortable.   You may take Benzonatate capsules for cough. Take 1 capsule by mouth three times daily as needed for cough.  Cough/Congestion: Try taking Mucinex DM. This will help loosen up the mucous in your chest. Ensure you take this medication with a full glass of water.  Please call me Monday next week if you're feeling no better or if you get worse.  It was a pleasure to see you today!   Upper Respiratory Infection, Adult Most upper respiratory infections (URIs) are a viral infection of the air passages leading to the lungs. A URI affects the nose, throat, and upper air passages. The most common type of URI is nasopharyngitis and is typically referred to as "the common cold." URIs run their course and usually go away on their own. Most of the time, a URI does not require medical attention, but sometimes a bacterial infection in the upper airways can follow a viral infection. This is called a secondary infection. Sinus and middle ear infections are common types of secondary upper respiratory infections. Bacterial pneumonia can also complicate a URI. A URI can worsen asthma and chronic obstructive pulmonary disease (COPD). Sometimes, these complications can require emergency medical care and may be life threatening. What are the causes? Almost all URIs are caused by viruses. A virus is a type of germ and can spread from one person to another. What increases the risk? You may be at risk for a URI if:  You smoke.  You have chronic heart or lung disease.  You have a weakened defense (immune) system.  You are very young or very old.  You have nasal allergies or asthma.  You work in crowded or poorly ventilated areas.  You work in health care facilities or schools. What are the signs or  symptoms? Symptoms typically develop 2-3 days after you come in contact with a cold virus. Most viral URIs last 7-10 days. However, viral URIs from the influenza virus (flu virus) can last 14-18 days and are typically more severe. Symptoms may include:  Runny or stuffy (congested) nose.  Sneezing.  Cough.  Sore throat.  Headache.  Fatigue.  Fever.  Loss of appetite.  Pain in your forehead, behind your eyes, and over your cheekbones (sinus pain).  Muscle aches. How is this diagnosed? Your health care provider may diagnose a URI by:  Physical exam.  Tests to check that your symptoms are not due to another condition such as:  Strep throat.  Sinusitis.  Pneumonia.  Asthma. How is this treated? A URI goes away on its own with time. It cannot be cured with medicines, but medicines may be prescribed or recommended to relieve symptoms. Medicines may help:  Reduce your fever.  Reduce your cough.  Relieve nasal congestion. Follow these instructions at home:  Take medicines only as directed by your health care provider.  Gargle warm saltwater or take cough drops to comfort your throat as directed by your health care provider.  Use a warm mist humidifier or inhale steam from a shower to increase air moisture. This may make it easier to breathe.  Drink enough fluid to keep your urine clear or pale yellow.  Eat soups and other clear broths and maintain good nutrition.  Rest as needed.  Return to  work when your temperature has returned to normal or as your health care provider advises. You may need to stay home longer to avoid infecting others. You can also use a face mask and careful hand washing to prevent spread of the virus.  Increase the usage of your inhaler if you have asthma.  Do not use any tobacco products, including cigarettes, chewing tobacco, or electronic cigarettes. If you need help quitting, ask your health care provider. How is this prevented? The  best way to protect yourself from getting a cold is to practice good hygiene.  Avoid oral or hand contact with people with cold symptoms.  Wash your hands often if contact occurs. There is no clear evidence that vitamin C, vitamin E, echinacea, or exercise reduces the chance of developing a cold. However, it is always recommended to get plenty of rest, exercise, and practice good nutrition. Contact a health care provider if:  You are getting worse rather than better.  Your symptoms are not controlled by medicine.  You have chills.  You have worsening shortness of breath.  You have brown or red mucus.  You have yellow or brown nasal discharge.  You have pain in your face, especially when you bend forward.  You have a fever.  You have swollen neck glands.  You have pain while swallowing.  You have white areas in the back of your throat. Get help right away if:  You have severe or persistent:  Headache.  Ear pain.  Sinus pain.  Chest pain.  You have chronic lung disease and any of the following:  Wheezing.  Prolonged cough.  Coughing up blood.  A change in your usual mucus.  You have a stiff neck.  You have changes in your:  Vision.  Hearing.  Thinking.  Mood. This information is not intended to replace advice given to you by your health care provider. Make sure you discuss any questions you have with your health care provider. Document Released: 12/02/2000 Document Revised: 02/09/2016 Document Reviewed: 09/13/2013 Elsevier Interactive Patient Education  2017 [employer].

## 2016-06-24 NOTE — Progress Notes (Signed)
Pre visit review using our clinic review tool, if applicable. No additional management support is needed unless otherwise documented below in the visit note. 

## 2016-06-29 ENCOUNTER — Encounter: Payer: Self-pay | Admitting: Primary Care

## 2016-06-30 ENCOUNTER — Other Ambulatory Visit: Payer: Self-pay | Admitting: Primary Care

## 2016-06-30 ENCOUNTER — Encounter: Payer: Self-pay | Admitting: Primary Care

## 2016-06-30 DIAGNOSIS — J019 Acute sinusitis, unspecified: Secondary | ICD-10-CM

## 2016-06-30 MED ORDER — AZITHROMYCIN 250 MG PO TABS: ORAL_TABLET | ORAL | 0 refills | Status: DC

## 2016-07-13 ENCOUNTER — Telehealth: Payer: Self-pay | Admitting: Primary Care

## 2016-07-13 DIAGNOSIS — G8929 Other chronic pain: Secondary | ICD-10-CM

## 2016-07-13 DIAGNOSIS — R51 Headache: Principal | ICD-10-CM

## 2016-07-13 NOTE — Telephone Encounter (Signed)
Ok to refill? Electronically refill request for   traMADol (ULTRAM) 50 MG tablet  Last prescribed on 04/08/2016. Last seen on 06/24/2016.

## 2016-07-14 NOTE — Telephone Encounter (Signed)
Message left for patient to return my call.  

## 2016-07-15 ENCOUNTER — Other Ambulatory Visit: Payer: Self-pay | Admitting: Primary Care

## 2016-07-15 DIAGNOSIS — R51 Headache: Principal | ICD-10-CM

## 2016-07-15 DIAGNOSIS — G8929 Other chronic pain: Secondary | ICD-10-CM

## 2016-07-16 ENCOUNTER — Encounter: Payer: Self-pay | Admitting: Primary Care

## 2016-07-16 NOTE — Telephone Encounter (Signed)
PT stopped by and updated her contract.

## 2016-07-16 NOTE — Telephone Encounter (Signed)
Spoken to patient and she stated that she will come by today.   Need to update UDS before medication can be called in.

## 2016-07-17 ENCOUNTER — Encounter: Payer: Self-pay | Admitting: Primary Care

## 2016-07-17 NOTE — Telephone Encounter (Signed)
Called in medication to the pharmacy as instructed. 

## 2016-09-19 ENCOUNTER — Encounter: Payer: Self-pay | Admitting: Primary Care

## 2016-09-30 ENCOUNTER — Other Ambulatory Visit: Payer: Self-pay | Admitting: Primary Care

## 2016-09-30 DIAGNOSIS — G8929 Other chronic pain: Secondary | ICD-10-CM

## 2016-09-30 DIAGNOSIS — R51 Headache: Principal | ICD-10-CM

## 2016-10-01 NOTE — Telephone Encounter (Signed)
Spoke to pt and advised per [facility]. Pt verbally expressed understanding and will pickup Rx tomorrow.

## 2016-10-01 NOTE — Telephone Encounter (Signed)
Ok to refill? Electronically refill request for traMADol (ULTRAM) 50 MG tablet. Last prescribed on 07/13/2016. Need controlled substance contract and UDS. Last seen on 06/24/2016.

## 2016-10-02 ENCOUNTER — Encounter: Payer: Self-pay | Admitting: Primary Care

## 2016-12-18 ENCOUNTER — Other Ambulatory Visit: Payer: Self-pay

## 2016-12-18 DIAGNOSIS — J439 Emphysema, unspecified: Secondary | ICD-10-CM

## 2016-12-18 NOTE — Telephone Encounter (Signed)
Pt left vm requesting a refill she would like to pick up this rx.  No number left to call back.

## 2016-12-18 NOTE — Telephone Encounter (Signed)
Spoken to patient. Patient stated that she need a hard copy of Rx of the albuterol inhaler.  There is a program through her insurance where she can get the inhaler cheaper.  Notified patient that [name] is out of office. Will ask if ask another provider will refill. Last prescribed on 07/18/2015. Last seen on 06/24/2016.

## 2016-12-20 MED ORDER — ALBUTEROL SULFATE HFA 108 (90 BASE) MCG/ACT IN AERS: 2.0000 | INHALATION_SPRAY | Freq: Four times a day (QID) | RESPIRATORY_TRACT | 0 refills | Status: DC | PRN

## 2016-12-20 NOTE — Telephone Encounter (Signed)
Printed.  Thanks.  

## 2016-12-21 NOTE — Telephone Encounter (Signed)
Patient advised.  Rx left at front desk for pick up. 

## 2017-01-12 ENCOUNTER — Telehealth: Payer: Self-pay | Admitting: Primary Care

## 2017-01-12 DIAGNOSIS — R51 Headache: Principal | ICD-10-CM

## 2017-01-12 DIAGNOSIS — R519 Headache, unspecified: Secondary | ICD-10-CM

## 2017-01-13 NOTE — Telephone Encounter (Signed)
Ok to refill? Electronically refill request for traMADol (ULTRAM) 50 MG tablet.  Last prescribed on 10/01/2016. Last seen on 06/24/2016

## 2017-01-14 NOTE — Telephone Encounter (Signed)
Message left for patient to return my call. Patient is due for her UDS.

## 2017-01-15 NOTE — Telephone Encounter (Signed)
Patient returned Chan's call. °

## 2017-01-15 NOTE — Telephone Encounter (Signed)
Patient returned Chan's call.  Patient can be reached at [phone].

## 2017-01-15 NOTE — Telephone Encounter (Signed)
Spoken to patient and notified she is due for her UDS. Patient stated she will get it done next week.  Called in the [facility]adol [facility].

## 2017-01-15 NOTE — Telephone Encounter (Signed)
Message left for patient to return my call.  

## 2017-04-19 ENCOUNTER — Other Ambulatory Visit: Payer: Self-pay | Admitting: Primary Care

## 2017-04-19 DIAGNOSIS — R51 Headache: Principal | ICD-10-CM

## 2017-04-19 DIAGNOSIS — R519 Headache, unspecified: Secondary | ICD-10-CM

## 2017-04-20 NOTE — Telephone Encounter (Signed)
Ok to refill in [place] absent? Electronically refill request for traMADol (ULTRAM) 50 MG tablet  Last prescribed on 01/13/2017. Last seen on 06/24/2016.  Patient is due for controlled substance contract.

## 2017-04-21 NOTE — Telephone Encounter (Signed)
Message left for patient to return my call.  

## 2017-04-23 ENCOUNTER — Telehealth: Payer: Self-pay | Admitting: Primary Care

## 2017-04-23 NOTE — Telephone Encounter (Signed)
New Message  Pt is needing lab work per provider on prescription envelope pick-up but there is not order in the system.  Please add lab order and pt is scheduled 11.13.18, the week when Alma Friendly, NP will return to office.  Thanks.

## 2017-04-26 ENCOUNTER — Other Ambulatory Visit: Payer: Self-pay | Admitting: *Deleted

## 2017-04-26 DIAGNOSIS — Z0283 Encounter for blood-alcohol and blood-drug test: Secondary | ICD-10-CM

## 2017-04-26 NOTE — Telephone Encounter (Signed)
Patient needed to renewal of her controlled substance contract. It fine the lab appointment. Order the urine drug test has been placed.

## 2017-05-04 ENCOUNTER — Other Ambulatory Visit: Payer: Medicare Other

## 2017-05-10 ENCOUNTER — Other Ambulatory Visit: Payer: Medicare Other

## 2017-05-11 ENCOUNTER — Other Ambulatory Visit: Payer: Medicare Other

## 2017-05-11 DIAGNOSIS — Z0283 Encounter for blood-alcohol and blood-drug test: Secondary | ICD-10-CM

## 2017-05-17 LAB — PAIN MGMT, PROFILE 8 W/CONF, U
6 Acetylmorphine: NEGATIVE ng/mL (ref <10)
ALCOHOL METABOLITES: POSITIVE ng/mL — AB (ref <500)
ALPHAHYDROXYMIDAZOLAM: NEGATIVE ng/mL (ref <50)
ALPHAHYDROXYTRIAZOLAM: NEGATIVE ng/mL (ref <50)
AMPHETAMINES: NEGATIVE ng/mL (ref <500)
Alphahydroxyalprazolam: NEGATIVE ng/mL (ref <25)
Aminoclonazepam: NEGATIVE ng/mL (ref <25)
Benzodiazepines: NEGATIVE ng/mL (ref <100)
Buprenorphine, Urine: NEGATIVE ng/mL (ref <5)
COCAINE METABOLITE: NEGATIVE ng/mL (ref <150)
CREATININE: 111.3 mg/dL
ETHYL SULFATE (ETS): 12271 ng/mL — AB (ref <100)
Ethyl Glucuronide (ETG): 100365 ng/mL — ABNORMAL HIGH (ref <500)
Hydroxyethylflurazepam: NEGATIVE ng/mL (ref <50)
LORAZEPAM: NEGATIVE ng/mL (ref <50)
MDMA: NEGATIVE ng/mL (ref <500)
Marijuana Metabolite: NEGATIVE ng/mL (ref <20)
NORDIAZEPAM: NEGATIVE ng/mL (ref <50)
OXAZEPAM: NEGATIVE ng/mL (ref <50)
OXIDANT: NEGATIVE ug/mL (ref <200)
OXYCODONE: NEGATIVE ng/mL (ref <100)
Opiates: NEGATIVE ng/mL (ref <100)
PH: 5.79 (ref 4.5–9.0)
Temazepam: NEGATIVE ng/mL (ref <50)

## 2017-08-04 ENCOUNTER — Other Ambulatory Visit: Payer: Self-pay | Admitting: Family Medicine

## 2017-08-04 DIAGNOSIS — R51 Headache: Principal | ICD-10-CM

## 2017-08-04 DIAGNOSIS — R519 Headache, unspecified: Secondary | ICD-10-CM

## 2017-08-05 NOTE — Telephone Encounter (Signed)
Needs follow-up office visit for any refills.  Tramadol request denied.

## 2017-08-05 NOTE — Telephone Encounter (Signed)
Last office visit 06/24/2016 for URI.  No future appointments scheduled.  Last refilled 04/20/2017 for #90 with no refills.  Refill?

## 2017-08-06 NOTE — Telephone Encounter (Signed)
Message left for patient to return my call.  

## 2017-08-09 ENCOUNTER — Telehealth: Payer: Self-pay | Admitting: Primary Care

## 2017-08-09 NOTE — Telephone Encounter (Signed)
Pt returned call. She has appt 08/10/17

## 2017-08-09 NOTE — Telephone Encounter (Signed)
Message left for patient to return my call. Patient need office visit for any refills.

## 2017-08-09 NOTE — Telephone Encounter (Signed)
Noted  

## 2017-08-09 NOTE — Telephone Encounter (Signed)
Copied from [place]. Topic: Quick Communication - Rx Refill/Question >> Aug 09, 2017 12:54 PM Betty Hurst wrote: Medication: traMADol (ULTRAM) 50 MG tablet [[id number]]    Has the patient contacted their pharmacy? Yes.     (Agent: If no, request that the patient contact the pharmacy for the refill.)   Preferred Pharm[facility] (with phone number [facility] [place]   Agent: Please be advised that RX refills may take up to 3 business days. We ask that you follow-up with your pharmacy.

## 2017-08-09 NOTE — Telephone Encounter (Signed)
Message left for patient to return my call.  

## 2017-08-10 ENCOUNTER — Encounter: Payer: Self-pay | Admitting: Primary Care

## 2017-08-10 ENCOUNTER — Ambulatory Visit: Payer: Medicare Other | Admitting: Primary Care

## 2017-08-10 DIAGNOSIS — J439 Emphysema, unspecified: Secondary | ICD-10-CM | POA: Diagnosis not present

## 2017-08-10 DIAGNOSIS — G43719 Chronic migraine without aura, intractable, without status migrainosus: Secondary | ICD-10-CM

## 2017-08-10 DIAGNOSIS — R51 Headache: Secondary | ICD-10-CM

## 2017-08-10 DIAGNOSIS — G8929 Other chronic pain: Secondary | ICD-10-CM

## 2017-08-10 MED ORDER — ALBUTEROL SULFATE HFA 108 (90 BASE) MCG/ACT IN AERS: 2.0000 | INHALATION_SPRAY | RESPIRATORY_TRACT | 0 refills | Status: DC | PRN

## 2017-08-10 MED ORDER — TRAMADOL HCL 50 MG PO TABS: ORAL_TABLET | ORAL | 0 refills | Status: DC

## 2017-08-10 NOTE — Telephone Encounter (Signed)
Patient has been seen on 08/10/2017

## 2017-08-10 NOTE — Assessment & Plan Note (Signed)
No use of Symbicort since September 2018 due to cost. Will continue to monitor. She'll report back if she's using albuterol inhaler more than three times weekly. Exam today unremarkable.

## 2017-08-10 NOTE — Progress Notes (Signed)
Subjective:    Patient ID: Betty Hurst, female    DOB: [birth year], 62 y.o.   MRN: [id number]  HPI  Betty Hurst is a 62 year old female who presents today for medication refill.  1) Chronic headaches/Migraines: Currently managed on tramadol 50 mg for which she takes once daily for headaches. She has been on this regimen for years and is trying to limit her use. She is needing a refill.  2) Pulmonary Emphysema: Currently prescribed Symbicort for chronic cough and shortness of breath. She is not using her Symbicort Inhaler since September 2018 as she could no longer afford it. Since off of her inhaler she's feeling fine, denies recurrent cough and shortness of breath. She's not using her albuterol inhaler.   She's recently experienced cough, drainage, nasal and throat congestion for the past 2 weeks. She's been taking [place] cold and cough with improvement. She had a low grade fever several days ago, no recent fevers. Overall she's feeling well. Denies shortness of breath, wheezing, productive cough.   Review of Systems  Constitutional: Negative for chills, fatigue and fever.  HENT: Positive for congestion. Negative for sneezing and sore throat.   Respiratory: Positive for cough. Negative for shortness of breath and wheezing.   Cardiovascular: Negative for chest pain.  Neurological:       Chronic headaches       Past Medical History:  Diagnosis Date  . Headache   . Small bowel obstruction [facility])    Surgery in 2006     Social History   Socioeconomic History  . Marital status: Married    Spouse name: Not on file  . Number of children: Not on file  . Years of education: Not on file  . Highest education level: Not on file  Social Needs  . Financial resource strain: Not on file  . Food insecurity - worry: Not on file  . Food insecurity - inability: Not on file  . Transportation needs - medical: Not on file  . Transportation needs - non-medical: Not on file  Occupational  History  . Not on file  Tobacco Use  . Smoking status: Current Every Day Smoker    Packs/day: 0.50    Types: Cigarettes  . Smokeless tobacco: Never Used  . Tobacco comment: patient is aware that she needs to quit   Substance and Sexual Activity  . Alcohol use: Yes    Alcohol/week: 0.0 oz    Comment: occ  . Drug use: No  . Sexual activity: Yes    Partners: Male  Other Topics Concern  . Not on file  Social History Narrative   Married.   Lives in [place].   2 daughters, 3 grandchildren.   On disability from work.   Enjoys being outside, gardening, volunteers at [employer].    Past Surgical History:  Procedure Laterality Date  . HEMORROIDECTOMY    . NASAL SINUS SURGERY     14 surgeries  . OTHER SURGICAL HISTORY     Bladder sling  . SMALL INTESTINE SURGERY     Small bowel obstruction in 2006  . TONSILLECTOMY      Family History  Problem Relation Age of Onset  . Stroke Father        Deceased  . Renal Disease Paternal Grandfather   . Heart failure Paternal Grandfather     Allergies  Allergen Reactions  . Influenza Vaccines Hives  . Morphine Hives and Swelling  . Cefuroxime Axetil Rash  No current outpatient medications on file prior to visit.   No current facility-administered medications on file prior to visit.     BP 112/62   Pulse 92   Temp 98.4 F (36.9 C) (Oral)   Wt 124 lb 8 oz (56.5 kg)   SpO2 99%   BMI 23.52 kg/m    Objective:   Physical Exam  Constitutional: She appears well-nourished.  HENT:  Right Ear: Tympanic membrane and ear canal normal.  Left Ear: Tympanic membrane and ear canal normal.  Nose: Right sinus exhibits no maxillary sinus tenderness and no frontal sinus tenderness. Left sinus exhibits no maxillary sinus tenderness and no frontal sinus tenderness.  Mouth/Throat: Oropharynx is clear and moist.  Eyes: Conjunctivae are normal.  Neck: Neck supple.  Cardiovascular: Normal rate and regular rhythm.    Pulmonary/Chest: Effort normal and breath sounds normal. She has no wheezes. She has no rales.  Lymphadenopathy:    She has no cervical adenopathy.  Skin: Skin is warm and dry.          Assessment & Plan:  Viral URI:  Cough, congestion x 2 weeks. Improved on OTC treatment, feeling well. Exam today unremarkable. Continue OTC treatment, use albuterol PRN. Return precautions provided.  Betty Koch, NP

## 2017-08-10 NOTE — Assessment & Plan Note (Signed)
Using Tramadol less frequently, commended her on this. UDS is UTD. Refilled Tramadol, sent to pharmacy.

## 2017-08-10 NOTE — Patient Instructions (Signed)
Cough/Congestion: Try taking Mucinex DM. This will help loosen up the mucous in your chest. Ensure you take this medication with a full glass of water.  Shortness of Breath/Wheezing/Cough: Use the albuterol inhaler. Inhale 2 puffs into the lungs every 4 to six hours as needed for wheezing, cough, and/or shortness of breath.   Please notify me if you develop persistent fevers of 101, start coughing up green mucous, or feel worse within the next 5 days.   Increase consumption of water intake and rest.  Please notify me if your cough persists when not feeling sick.  It was a pleasure to see you today!

## 2017-11-17 ENCOUNTER — Other Ambulatory Visit: Payer: Self-pay | Admitting: Primary Care

## 2017-11-17 DIAGNOSIS — R51 Headache: Principal | ICD-10-CM

## 2017-11-17 DIAGNOSIS — G8929 Other chronic pain: Secondary | ICD-10-CM

## 2017-11-17 NOTE — Telephone Encounter (Signed)
Noted, refills sent to pharmacy. 

## 2017-11-17 NOTE — Telephone Encounter (Signed)
Last filled 10-20-17 #90 Last OV 08-10-17 Next OV 12-10-17

## 2017-12-10 ENCOUNTER — Encounter: Payer: Self-pay | Admitting: Primary Care

## 2017-12-10 ENCOUNTER — Ambulatory Visit (INDEPENDENT_AMBULATORY_CARE_PROVIDER_SITE_OTHER): Payer: Medicare Other | Admitting: Primary Care

## 2017-12-10 VITALS — BP 120/70 | HR 99 | Temp 98.3°F | Ht 61.0 in | Wt 121.5 lb

## 2017-12-10 DIAGNOSIS — G43719 Chronic migraine without aura, intractable, without status migrainosus: Secondary | ICD-10-CM

## 2017-12-10 DIAGNOSIS — Z1239 Encounter for other screening for malignant neoplasm of breast: Secondary | ICD-10-CM

## 2017-12-10 DIAGNOSIS — Z79899 Other long term (current) drug therapy: Secondary | ICD-10-CM | POA: Diagnosis not present

## 2017-12-10 DIAGNOSIS — Z1231 Encounter for screening mammogram for malignant neoplasm of breast: Secondary | ICD-10-CM | POA: Diagnosis not present

## 2017-12-10 DIAGNOSIS — Z0000 Encounter for general adult medical examination without abnormal findings: Secondary | ICD-10-CM

## 2017-12-10 DIAGNOSIS — J439 Emphysema, unspecified: Secondary | ICD-10-CM

## 2017-12-10 MED ORDER — ALBUTEROL SULFATE HFA 108 (90 BASE) MCG/ACT IN AERS: 2.0000 | INHALATION_SPRAY | RESPIRATORY_TRACT | 0 refills | Status: DC | PRN

## 2017-12-10 NOTE — Assessment & Plan Note (Signed)
Stable, using Tramadol, 1/4 tablet three times daily. Overall feels well managed.

## 2017-12-10 NOTE — Progress Notes (Signed)
Subjective:    Patient ID: Betty Hurst, female    DOB: 1955/11/29, 62 y.o.   MRN: [id number]  HPI  Betty Hurst is a 62 year old female who presents today for complete physical.  Immunizations: -Tetanus: Completed in 2010 -Influenza: Allergy -Pneumonia: Completed in 2016  Diet:  She endorses a fair diet. Breakfast: Belvita crackers Lunch: Frozen meal, grilled chicken, some vegetables  Dinner: Cheese sticks, peanut butter crackers, fruit Snacks: Same as above Desserts: None Beverages: Diet soda, sugar free powerade, no water.  Exercise: She walks, is active in the yard Eye exam: Completed 2 years ago, due again Dental exam: Completes annually  Colonoscopy: Completed in 2013 Pap Smear: Hysterectomy  Mammogram: Due Hep C Screen: Negative in 2017   Review of Systems  Constitutional: Negative for unexpected weight change.  HENT: Negative for rhinorrhea.   Respiratory: Negative for cough and shortness of breath.   Cardiovascular: Negative for chest pain.  Gastrointestinal: Negative for constipation and diarrhea.  Genitourinary: Negative for difficulty urinating and menstrual problem.  Musculoskeletal: Negative for arthralgias and myalgias.  Skin: Negative for rash.  Allergic/Immunologic: Negative for environmental allergies.  Neurological: Negative for dizziness, numbness and headaches.  Psychiatric/Behavioral: The patient is not nervous/anxious.        Past Medical History:  Diagnosis Date  . Headache   . Small bowel obstruction [facility])    Surgery in 2006     Social History   Socioeconomic History  . Marital status: Married    Spouse name: Not on file  . Number of children: Not on file  . Years of education: Not on file  . Highest education level: Not on file  Occupational History  . Not on file  Social Needs  . Financial resource strain: Not on file  . Food insecurity:    Worry: Not on file    Inability: Not on file  . Transportation needs:    Medical:  Not on file    Non-medical: Not on file  Tobacco Use  . Smoking status: Current Every Day Smoker    Packs/day: 0.50    Types: Cigarettes  . Smokeless tobacco: Never Used  . Tobacco comment: patient is aware that she needs to quit   Substance and Sexual Activity  . Alcohol use: Yes    Alcohol/week: 0.0 oz    Comment: occ  . Drug use: No  . Sexual activity: Yes    Partners: Male  Lifestyle  . Physical activity:    Days per week: Not on file    Minutes per session: Not on file  . Stress: Not on file  Relationships  . Social connections:    Talks on phone: Not on file    Gets together: Not on file    Attends religious service: Not on file    Active member of club or organization: Not on file    Attends meetings of clubs or organizations: Not on file    Relationship status: Not on file  . Intimate partner violence:    Fear of current or ex partner: Not on file    Emotionally abused: Not on file    Physically abused: Not on file    Forced sexual activity: Not on file  Other Topics Concern  . Not on file  Social History Narrative   Married.   Lives in [place].   2 daughters, 3 grandchildren.   On disability from work.   Enjoys being outside, gardening, volunteers at [employer].  Past Surgical History:  Procedure Laterality Date  . HEMORROIDECTOMY    . NASAL SINUS SURGERY     14 surgeries  . OTHER SURGICAL HISTORY     Bladder sling  . SMALL INTESTINE SURGERY     Small bowel obstruction in 2006  . TONSILLECTOMY      Family History  Problem Relation Age of Onset  . Stroke Father        Deceased  . Renal Disease Paternal Grandfather   . Heart failure Paternal Grandfather     Allergies  Allergen Reactions  . Influenza Vaccines Hives  . Morphine Hives and Swelling  . Cefuroxime Axetil Rash    Current Outpatient Medications on File Prior to Visit  Medication Sig Dispense Refill  . traMADol (ULTRAM) 50 MG tablet TAKE 1/2 TO 1 TABLET BY MOUTH  ONCE DAILY AS NEEDED FOR HEADACHE. 30 tablet 0   No current facility-administered medications on file prior to visit.     BP 120/70   Pulse 99   Temp 98.3 F (36.8 C) (Oral)   Ht 5\' 1"  (1.549 m)   Wt 121 lb 8 oz (55.1 kg)   SpO2 97%   BMI 22.96 kg/m    Objective:   Physical Exam  Constitutional: She is oriented to person, place, and time. She appears well-nourished.  HENT:  Mouth/Throat: No oropharyngeal exudate.  Eyes: Pupils are equal, round, and reactive to light. EOM are normal.  Neck: Neck supple. No thyromegaly present.  Cardiovascular: Normal rate and regular rhythm.  Respiratory: Effort normal and breath sounds normal.  GI: Soft. Bowel sounds are normal. There is no tenderness.  Musculoskeletal: Normal range of motion.  Neurological: She is alert and oriented to person, place, and time.  Skin: Skin is warm and dry.  Psychiatric: She has a normal mood and affect.           Assessment & Plan:

## 2017-12-10 NOTE — Assessment & Plan Note (Signed)
Immunizations UTD. Mammogram due, orders placed.  Colonoscopy UTD, due in 2023. Recommended to increase vegetables, fruit, whole grains, water. Recommended continued exercise. Exam unremarkable. Labs pending. Follow up in 1 year for CPE.

## 2017-12-10 NOTE — Assessment & Plan Note (Signed)
Using albuterol infrequently, increased use during seasonal/pollen changes. Continue same. Continue to monitor.

## 2017-12-10 NOTE — Patient Instructions (Addendum)
Stop by the lab prior to leaving today. I will notify you of your results once received.   Call the [facility] to schedule your mammogram.  Schedule an appointment with your eye doctor.  Ensure you are consuming 64 ounces of water daily.  Increase consumption of vegetables, fruit, whole grains, lean protein.  You are due for your urine drug screen this November 2019. Please schedule a lab only appointment to have this done.   Follow up in 1 year for your annual exam or sooner if needed.  It was a pleasure to see you today!   Preventive Care 40-64 Years, Female Preventive care refers to lifestyle choices and visits with your health care provider that can promote health and wellness. What does preventive care include?  A yearly physical exam. This is also called an annual well check.  Dental exams once or twice a year.  Routine eye exams. Ask your health care provider how often you should have your eyes checked.  Personal lifestyle choices, including: ? Daily care of your teeth and gums. ? Regular physical activity. ? Eating a healthy diet. ? Avoiding tobacco and drug use. ? Limiting alcohol use. ? Practicing safe sex. ? Taking low-dose aspirin daily starting at age 79. ? Taking vitamin and mineral supplements as recommended by your health care provider. What happens during an annual well check? The services and screenings done by your health care provider during your annual well check will depend on your age, overall health, lifestyle risk factors, and family history of disease. Counseling Your health care provider may ask you questions about your:  Alcohol use.  Tobacco use.  Drug use.  Emotional well-being.  Home and relationship well-being.  Sexual activity.  Eating habits.  Work and work Statistician.  Method of birth control.  Menstrual cycle.  Pregnancy history.  Screening You may have the following tests or measurements:  Height,  weight, and BMI.  Blood pressure.  Lipid and cholesterol levels. These may be checked every 5 years, or more frequently if you are over 59 years old.  Skin check.  Lung cancer screening. You may have this screening every year starting at age 73 if you have a 30-pack-year history of smoking and currently smoke or have quit within the past 15 years.  Fecal occult blood test (FOBT) of the stool. You may have this test every year starting at age 84.  Flexible sigmoidoscopy or colonoscopy. You may have a sigmoidoscopy every 5 years or a colonoscopy every 10 years starting at age 16.  Hepatitis C blood test.  Hepatitis B blood test.  Sexually transmitted disease (STD) testing.  Diabetes screening. This is done by checking your blood sugar (glucose) after you have not eaten for a while (fasting). You may have this done every 1-3 years.  Mammogram. This may be done every 1-2 years. Talk to your health care provider about when you should start having regular mammograms. This may depend on whether you have a family history of breast cancer.  BRCA-related cancer screening. This may be done if you have a family history of breast, ovarian, tubal, or peritoneal cancers.  Pelvic exam and Pap test. This may be done every 3 years starting at age 87. Starting at age 26, this may be done every 5 years if you have a Pap test in combination with an HPV test.  Bone density scan. This is done to screen for osteoporosis. You may have this scan if you are at high  risk for osteoporosis.  Discuss your test results, treatment options, and if necessary, the need for more tests with your health care provider. Vaccines Your health care provider may recommend certain vaccines, such as:  Influenza vaccine. This is recommended every year.  Tetanus, diphtheria, and acellular pertussis (Tdap, Td) vaccine. You may need a Td booster every 10 years.  Varicella vaccine. You may need this if you have not been  vaccinated.  Zoster vaccine. You may need this after age 44.  Measles, mumps, and rubella (MMR) vaccine. You may need at least one dose of MMR if you were born in 1957 or later. You may also need a second dose.  Pneumococcal 13-valent conjugate (PCV13) vaccine. You may need this if you have certain conditions and were not previously vaccinated.  Pneumococcal polysaccharide (PPSV23) vaccine. You may need one or two doses if you smoke cigarettes or if you have certain conditions.  Meningococcal vaccine. You may need this if you have certain conditions.  Hepatitis A vaccine. You may need this if you have certain conditions or if you travel or work in places where you may be exposed to hepatitis A.  Hepatitis B vaccine. You may need this if you have certain conditions or if you travel or work in places where you may be exposed to hepatitis B.  Haemophilus influenzae type b (Hib) vaccine. You may need this if you have certain conditions.  Talk to your health care provider about which screenings and vaccines you need and how often you need them. This information is not intended to replace advice given to you by your health care provider. Make sure you discuss any questions you have with your health care provider. Document Released: 07/05/2015 Document Revised: 02/26/2016 Document Reviewed: 04/09/2015 Elsevier Interactive Patient Education  [employer].

## 2017-12-11 LAB — LIPID PANEL
Cholesterol: 183 mg/dL (ref <200)
HDL: 99 mg/dL (ref >50)
LDL CHOLESTEROL (CALC): 70 mg/dL
Non-HDL Cholesterol (Calc): 84 mg/dL (calc) (ref <130)
Total CHOL/HDL Ratio: 1.8 (calc) (ref <5.0)
Triglycerides: 67 mg/dL (ref <150)

## 2017-12-11 LAB — COMPREHENSIVE METABOLIC PANEL
AG RATIO: 1.8 (calc) (ref 1.0–2.5)
ALT: 10 U/L (ref 6–29)
AST: 17 U/L (ref 10–35)
Albumin: 4.4 g/dL (ref 3.6–5.1)
Alkaline phosphatase (APISO): 50 U/L (ref 33–130)
BILIRUBIN TOTAL: 0.4 mg/dL (ref 0.2–1.2)
BUN: 9 mg/dL (ref 7–25)
CALCIUM: 9.8 mg/dL (ref 8.6–10.4)
CO2: 25 mmol/L (ref 20–32)
Chloride: 97 mmol/L — ABNORMAL LOW (ref 98–110)
Creat: 0.78 mg/dL (ref 0.50–1.05)
GLOBULIN: 2.5 g/dL (ref 1.9–3.7)
Glucose, Bld: 103 mg/dL — ABNORMAL HIGH (ref 65–99)
Potassium: 4.7 mmol/L (ref 3.5–5.3)
SODIUM: 134 mmol/L — AB (ref 135–146)
TOTAL PROTEIN: 6.9 g/dL (ref 6.1–8.1)

## 2017-12-12 ENCOUNTER — Other Ambulatory Visit: Payer: Self-pay | Admitting: Primary Care

## 2017-12-12 DIAGNOSIS — R739 Hyperglycemia, unspecified: Secondary | ICD-10-CM

## 2017-12-15 ENCOUNTER — Other Ambulatory Visit (INDEPENDENT_AMBULATORY_CARE_PROVIDER_SITE_OTHER): Payer: Medicare Other

## 2017-12-15 DIAGNOSIS — R739 Hyperglycemia, unspecified: Secondary | ICD-10-CM

## 2017-12-15 LAB — POCT GLYCOSYLATED HEMOGLOBIN (HGB A1C): HEMOGLOBIN A1C: 5.2 % (ref 4.0–5.6)

## 2017-12-20 ENCOUNTER — Other Ambulatory Visit: Payer: Self-pay | Admitting: Primary Care

## 2017-12-20 DIAGNOSIS — R51 Headache: Principal | ICD-10-CM

## 2017-12-20 DIAGNOSIS — G8929 Other chronic pain: Secondary | ICD-10-CM

## 2017-12-21 NOTE — Telephone Encounter (Signed)
Ok to refill in [place] absent  Name of Medication: traMADol (ULTRAM) 50 MG tablet  Name of Pharmacy: [facility] or Written Date and Quantity: 11/17/2017 #30  Last Office Visit and Type: 12/10/2017 CPE  Next Office Visit and Type:   Last Controlled Substance Agreement Date: 05/11/2017  Last UDS: 05/11/2017

## 2017-12-22 NOTE — Telephone Encounter (Signed)
Eprescribed.

## 2018-01-06 ENCOUNTER — Other Ambulatory Visit: Payer: Self-pay | Admitting: Primary Care

## 2018-01-06 DIAGNOSIS — J439 Emphysema, unspecified: Secondary | ICD-10-CM

## 2018-01-26 ENCOUNTER — Other Ambulatory Visit: Payer: Self-pay | Admitting: Family Medicine

## 2018-01-26 DIAGNOSIS — G8929 Other chronic pain: Secondary | ICD-10-CM

## 2018-01-26 DIAGNOSIS — R51 Headache: Principal | ICD-10-CM

## 2018-01-26 NOTE — Telephone Encounter (Signed)
Noted. Last UDS was in November 2018. No suspicious activity noted on PMP aware.

## 2018-01-26 NOTE — Telephone Encounter (Signed)
Name of Medication: Tramadol Name of Pharmacy: [facility] or Written Date and Quantity: 12/22/17, #30 Last Office Visit and Type: 12/10/17, CPE Next Office Visit and Type: 12/12/17, CPE Last Controlled Substance Agreement Date: 10/02/16 Last UDS: 07/16/16

## 2018-04-29 ENCOUNTER — Other Ambulatory Visit: Payer: Medicare Other

## 2018-04-29 DIAGNOSIS — G43719 Chronic migraine without aura, intractable, without status migrainosus: Secondary | ICD-10-CM

## 2018-04-29 DIAGNOSIS — Z79899 Other long term (current) drug therapy: Secondary | ICD-10-CM

## 2018-05-01 LAB — PAIN MGMT, PROFILE 8 W/CONF, U
6 Acetylmorphine: NEGATIVE ng/mL (ref <10)
Alcohol Metabolites: POSITIVE ng/mL — AB (ref <500)
Amphetamines: NEGATIVE ng/mL (ref <500)
BENZODIAZEPINES: NEGATIVE ng/mL (ref <100)
Buprenorphine, Urine: NEGATIVE ng/mL (ref <5)
CREATININE: 115.5 mg/dL
Cocaine Metabolite: NEGATIVE ng/mL (ref <150)
ETHYL SULFATE (ETS): 3571 ng/mL — AB (ref <100)
Ethyl Glucuronide (ETG): 27672 ng/mL — ABNORMAL HIGH (ref <500)
MDMA: NEGATIVE ng/mL (ref <500)
Marijuana Metabolite: NEGATIVE ng/mL (ref <20)
OPIATES: NEGATIVE ng/mL (ref <100)
Oxidant: NEGATIVE ug/mL (ref <200)
Oxycodone: NEGATIVE ng/mL (ref <100)
PH: 6.92 (ref 4.5–9.0)

## 2018-05-02 ENCOUNTER — Other Ambulatory Visit: Payer: Medicare Other

## 2018-05-09 ENCOUNTER — Other Ambulatory Visit: Payer: Self-pay | Admitting: Primary Care

## 2018-05-09 DIAGNOSIS — R519 Headache, unspecified: Secondary | ICD-10-CM

## 2018-05-09 DIAGNOSIS — R51 Headache: Principal | ICD-10-CM

## 2018-05-10 NOTE — Telephone Encounter (Signed)
Name of Medication: Tramadol Name of [facility]acy: [facility] Last Fill or Written Date and Quantity: 01/26/18 #90 Last Office Visit and Type: 12/10/17 CPE Next Office Visit and Type: 12/13/2018 Last Controlled Substance Agreement Date: 10/02/16 Last UDS:04/29/18

## 2018-05-10 NOTE — Telephone Encounter (Signed)
Noted. No suspicious activity noted on PMP aware site.

## 2018-08-13 ENCOUNTER — Other Ambulatory Visit: Payer: Self-pay | Admitting: Primary Care

## 2018-08-13 DIAGNOSIS — G8929 Other chronic pain: Secondary | ICD-10-CM

## 2018-08-13 DIAGNOSIS — R51 Headache: Principal | ICD-10-CM

## 2018-08-15 NOTE — Telephone Encounter (Signed)
Name of Medication: Tramadol  Name of [facility]acy[facility]  Last Fill or Written Date and Quantity: 05/10/2018 #90  Last Office Visit and Type: 12/10/17 CPE  Next Office Visit and Type: 12/13/2018  Last Controlled Substance Agreement Date: 05/11/2017  Last UDS:04/29/18

## 2018-08-15 NOTE — Telephone Encounter (Signed)
Noted, refill sent to pharmacy. 

## 2018-10-13 ENCOUNTER — Ambulatory Visit (INDEPENDENT_AMBULATORY_CARE_PROVIDER_SITE_OTHER): Payer: Medicare Other | Admitting: Primary Care

## 2018-10-13 ENCOUNTER — Encounter: Payer: Self-pay | Admitting: Primary Care

## 2018-10-13 DIAGNOSIS — J019 Acute sinusitis, unspecified: Secondary | ICD-10-CM | POA: Insufficient documentation

## 2018-10-13 HISTORY — DX: Acute sinusitis, unspecified: J01.90

## 2018-10-13 MED ORDER — AZITHROMYCIN 250 MG PO TABS: ORAL_TABLET | ORAL | 0 refills | Status: DC

## 2018-10-13 NOTE — Patient Instructions (Signed)
Start Azithromycin antibiotics for infection. Take 2 tablets by mouth today, then 1 tablet daily for 4 additional days.  Nasal Congestion/Ear Pressure/Sinus Pressure: Try using Flonase (fluticasone) nasal spray. Instill 1 spray in each nostril twice daily.   It was a pleasure to see you today! [name], NP-C

## 2018-10-13 NOTE — Progress Notes (Signed)
Subjective:    Patient ID: Betty Hurst, female    DOB: Jan 14, 1956, 63 y.o.   MRN: [id number]  HPI  Virtual Visit via Video Note  I connected with MY RINKE on 10/13/18 at 10:20 AM EDT by a video enabled telemedicine application and verified that I am speaking with the correct person using two identifiers.   I discussed the limitations of evaluation and management by telemedicine and the availability of in person appointments. The patient expressed understanding and agreed to proceed. She is at home, I am in the office.  We attempted a video visit but she could not get her phone camera to work with the virtual program. We conducted our visit via phone.   History of Present Illness:  Ms. Betty Hurst is a 63 year old female with a history of migraines, tobacco abuse who presents today with a chief complaint of nasal congestion.  She also reports of rhinorrhea, sinus pressure, dental pain, ear pain, mild cough. She's blowing thick green mucous from her nasal cavity. She denies fevers, chills. Her symptoms began about two weeks ago with increased symptoms over the last several days.   She's been using Alka Seltzer Mucous and an OTC " sinuscold and cough" with temporary improvement. She is not using nasal sprays.    Observations/Objective:  Alert and oriented. Sounds well. No cough during exam.   Assessment and Plan:  Symptoms suggestive of sinusitis, also some allergy involvement. She doesn't seem sickly based off of our conversation. Given duration of treatment coupled with expulsion of thick green mucous we will treat her with antibiotics.  Augmentin typically causes a lot of GI upset so she prefers Zpak which does well for symptoms. Rx for Zpak sent to pharmacy.   Follow Up Instructions:  Start Azithromycin antibiotics for infection. Take 2 tablets by mouth today, then 1 tablet daily for 4 additional days.  Nasal Congestion/Ear Pressure/Sinus Pressure: Try using Flonase  (fluticasone) nasal spray. Instill 1 spray in each nostril twice daily.   It was a pleasure to see you today! Betty Bossier, NP-C    I discussed the assessment and treatment plan with the patient. The patient was provided an opportunity to ask questions and all were answered. The patient agreed with the plan and demonstrated an understanding of the instructions.   The patient was advised to call back or seek an in-person evaluation if the symptoms worsen or if the condition fails to improve as anticipated.     [name], NP    Review of Systems  Constitutional: Negative for chills, fatigue and fever.  HENT: Positive for congestion, postnasal drip, rhinorrhea and sinus pressure. Negative for sore throat.   Respiratory: Positive for cough. Negative for shortness of breath.   Cardiovascular: Negative for chest pain.  Allergic/Immunologic: Positive for environmental allergies.       Past Medical History:  Diagnosis Date  . Headache   . Small bowel obstruction [facility])    Surgery in 2006     Social History   Socioeconomic History  . Marital status: Married    Spouse name: Not on file  . Number of children: Not on file  . Years of education: Not on file  . Highest education level: Not on file  Occupational History  . Not on file  Social Needs  . Financial resource strain: Not on file  . Food insecurity:    Worry: Not on file    Inability: Not on file  . Transportation  needs:    Medical: Not on file    Non-medical: Not on file  Tobacco Use  . Smoking status: Current Every Day Smoker    Packs/day: 0.50    Types: Cigarettes  . Smokeless tobacco: Never Used  . Tobacco comment: patient is aware that she needs to quit   Substance and Sexual Activity  . Alcohol use: Yes    Alcohol/week: 0.0 standard drinks    Comment: occ  . Drug use: No  . Sexual activity: Yes    Partners: Male  Lifestyle  . Physical activity:    Days per week: Not on file    Minutes per  session: Not on file  . Stress: Not on file  Relationships  . Social connections:    Talks on phone: Not on file    Gets together: Not on file    Attends religious service: Not on file    Active member of club or organization: Not on file    Attends meetings of clubs or organizations: Not on file    Relationship status: Not on file  . Intimate partner violence:    Fear of current or ex partner: Not on file    Emotionally abused: Not on file    Physically abused: Not on file    Forced sexual activity: Not on file  Other Topics Concern  . Not on file  Social History Narrative   Married.   Lives in [place].   2 daughters, 3 grandchildren.   On disability from work.   Enjoys being outside, gardening, volunteers at [employer].    Past Surgical History:  Procedure Laterality Date  . HEMORROIDECTOMY    . NASAL SINUS SURGERY     14 surgeries  . OTHER SURGICAL HISTORY     Bladder sling  . SMALL INTESTINE SURGERY     Small bowel obstruction in 2006  . TONSILLECTOMY      Family History  Problem Relation Age of Onset  . Stroke Father        Deceased  . Renal Disease Paternal Grandfather   . Heart failure Paternal Grandfather     Allergies  Allergen Reactions  . Influenza Vaccines Hives  . Morphine Hives and Swelling  . Cefuroxime Axetil Rash    Current Outpatient Medications on File Prior to Visit  Medication Sig Dispense Refill  . PROAIR HFA 108 (90 Base) MCG/ACT inhaler INHALE 2 PUFFS INTO THE LUNGS EVERY 4 (FOUR) HOURS AS NEEDED FOR WHEEZING OR SHORTNESS OF BREATH. 8.5 Inhaler 0  . traMADol (ULTRAM) 50 MG tablet TAKE 1/2 TO 1 TABLET BY MOUTH ONCE DAILY AS NEEDED FOR HEADACHE. 90 tablet 0   No current facility-administered medications on file prior to visit.     There were no vitals taken for this visit.   Objective:   Physical Exam  Constitutional: She is oriented to person, place, and time.  Respiratory: Effort normal. No respiratory distress.   Neurological: She is alert and oriented to person, place, and time.  Psychiatric: She has a normal mood and affect.           Assessment & Plan:

## 2018-10-13 NOTE — Assessment & Plan Note (Signed)
Symptoms suggestive of sinusitis, also some allergy involvement. She doesn't seem sickly based off of our conversation. Given duration of treatment coupled with expulsion of thick green mucous we will treat her with antibiotics.  Augmentin typically causes a lot of GI upset so she prefers Zpak which does well for symptoms. Rx for Zpak sent to pharmacy.

## 2018-10-26 ENCOUNTER — Other Ambulatory Visit: Payer: Self-pay | Admitting: Primary Care

## 2018-10-26 DIAGNOSIS — J439 Emphysema, unspecified: Secondary | ICD-10-CM

## 2018-11-16 ENCOUNTER — Other Ambulatory Visit: Payer: Self-pay | Admitting: Primary Care

## 2018-11-16 DIAGNOSIS — G8929 Other chronic pain: Secondary | ICD-10-CM

## 2018-11-17 NOTE — Telephone Encounter (Signed)
Last prescribed on 08/15/2018 #90 with 0 refills. Last appointment on 10/13/2018. Next future appointment on 12/13/2018.

## 2018-11-17 NOTE — Telephone Encounter (Signed)
Noted, will discuss future use of Tramadol during upcoming visit.

## 2018-11-30 ENCOUNTER — Other Ambulatory Visit: Payer: Self-pay | Admitting: Primary Care

## 2018-11-30 DIAGNOSIS — J439 Emphysema, unspecified: Secondary | ICD-10-CM

## 2018-12-13 ENCOUNTER — Ambulatory Visit (INDEPENDENT_AMBULATORY_CARE_PROVIDER_SITE_OTHER): Payer: Medicare Other | Admitting: Primary Care

## 2018-12-13 ENCOUNTER — Encounter: Payer: Self-pay | Admitting: Primary Care

## 2018-12-13 ENCOUNTER — Other Ambulatory Visit: Payer: Self-pay

## 2018-12-13 VITALS — BP 120/80 | HR 84 | Temp 98.1°F | Ht 61.0 in | Wt 114.5 lb

## 2018-12-13 DIAGNOSIS — J439 Emphysema, unspecified: Secondary | ICD-10-CM

## 2018-12-13 DIAGNOSIS — Z122 Encounter for screening for malignant neoplasm of respiratory organs: Secondary | ICD-10-CM

## 2018-12-13 DIAGNOSIS — Z720 Tobacco use: Secondary | ICD-10-CM

## 2018-12-13 DIAGNOSIS — G43719 Chronic migraine without aura, intractable, without status migrainosus: Secondary | ICD-10-CM

## 2018-12-13 DIAGNOSIS — Z0000 Encounter for general adult medical examination without abnormal findings: Secondary | ICD-10-CM

## 2018-12-13 DIAGNOSIS — Z1239 Encounter for other screening for malignant neoplasm of breast: Secondary | ICD-10-CM

## 2018-12-13 DIAGNOSIS — Z23 Encounter for immunization: Secondary | ICD-10-CM | POA: Diagnosis not present

## 2018-12-13 MED ORDER — SPIRIVA HANDIHALER 18 MCG IN CAPS: 18.0000 ug | ORAL_CAPSULE | Freq: Every day | RESPIRATORY_TRACT | 2 refills | Status: DC

## 2018-12-13 MED ORDER — ZOSTER VAC RECOMB ADJUVANTED 50 MCG/0.5ML IM SUSR: 0.5000 mL | Freq: Once | INTRAMUSCULAR | 1 refills | Status: AC

## 2018-12-13 NOTE — Assessment & Plan Note (Signed)
Immunizations UTD. Rx for Shingrix printed and provided. Mammogram overdue, orders placed. Colonoscopy UTD per patient. Referral for lung cancer screening placed. Exam as noted, stable. Labs pending. Follow up in 1 year for CPE.

## 2018-12-13 NOTE — Assessment & Plan Note (Signed)
Chronic, congested cough. Suspect a combination of allergies and chronic tobacco abuse.  She is using albuterol inhaler too frequently so we will start Spiriva daily to see if this helps.  She will update.

## 2018-12-13 NOTE — Assessment & Plan Note (Signed)
Encouraged cessation. Referral for lung cancer screening placed.

## 2018-12-13 NOTE — Patient Instructions (Addendum)
Start tiotropium (Spiriva) inhaler. Insert one capsule into the inhaler and inhale once daily.  Use the albuterol inhaler only as needed.   Consider trying an antihistamine such as Claritin, Allegra, Zyrtec to help with throat drainage.   You will be contacted regarding your referral to the headache specalist and for lung cancer screening.  Please let us know if you have not been contacted within one week.   Take the Shingles vaccination to your pharmacy.   Call the breast center to schedule your mammogram.   It was a pleasure to see you today!   Preventive Care 40-64 Years, Female Preventive care refers to lifestyle choices and visits with your health care provider that can promote health and wellness. What does preventive care include?   A yearly physical exam. This is also called an annual well check.  Dental exams once or twice a year.  Routine eye exams. Ask your health care provider how often you should have your eyes checked.  Personal lifestyle choices, including: ? Daily care of your teeth and gums. ? Regular physical activity. ? Eating a healthy diet. ? Avoiding tobacco and drug use. ? Limiting alcohol use. ? Practicing safe sex. ? Taking low-dose aspirin daily starting at age 39. ? Taking vitamin and mineral supplements as recommended by your health care provider. What happens during an annual well check? The services and screenings done by your health care provider during your annual well check will depend on your age, overall health, lifestyle risk factors, and family history of disease. Counseling Your health care provider may ask you questions about your:  Alcohol use.  Tobacco use.  Drug use.  Emotional well-being.  Home and relationship well-being.  Sexual activity.  Eating habits.  Work and work Statistician.  Method of birth control.  Menstrual cycle.  Pregnancy history. Screening You may have the following tests or  measurements:  Height, weight, and BMI.  Blood pressure.  Lipid and cholesterol levels. These may be checked every 5 years, or more frequently if you are over 61 years old.  Skin check.  Lung cancer screening. You may have this screening every year starting at age 56 if you have a 30-pack-year history of smoking and currently smoke or have quit within the past 15 years.  Colorectal cancer screening. All adults should have this screening starting at age 28 and continuing until age 60. Your health care provider may recommend screening at age 13. You will have tests every 1-10 years, depending on your results and the type of screening test. People at increased risk should start screening at an earlier age. Screening tests may include: ? Guaiac-based fecal occult blood testing. ? Fecal immunochemical test (FIT). ? Stool DNA test. ? Virtual colonoscopy. ? Sigmoidoscopy. During this test, a flexible tube with a tiny camera (sigmoidoscope) is used to examine your rectum and lower colon. The sigmoidoscope is inserted through your anus into your rectum and lower colon. ? Colonoscopy. During this test, a long, thin, flexible tube with a tiny camera (colonoscope) is used to examine your entire colon and rectum.  Hepatitis C blood test.  Hepatitis B blood test.  Sexually transmitted disease (STD) testing.  Diabetes screening. This is done by checking your blood sugar (glucose) after you have not eaten for a while (fasting). You may have this done every 1-3 years.  Mammogram. This may be done every 1-2 years. Talk to your health care provider about when you should start having regular mammograms. This may depend  on whether you have a family history of breast cancer.  BRCA-related cancer screening. This may be done if you have a family history of breast, ovarian, tubal, or peritoneal cancers.  Pelvic exam and Pap test. This may be done every 3 years starting at age 24. Starting at age 37, this may  be done every 5 years if you have a Pap test in combination with an HPV test.  Bone density scan. This is done to screen for osteoporosis. You may have this scan if you are at high risk for osteoporosis. Discuss your test results, treatment options, and if necessary, the need for more tests with your health care provider. Vaccines Your health care provider may recommend certain vaccines, such as:  Influenza vaccine. This is recommended every year.  Tetanus, diphtheria, and acellular pertussis (Tdap, Td) vaccine. You may need a Td booster every 10 years.  Varicella vaccine. You may need this if you have not been vaccinated.  Zoster vaccine. You may need this after age 26.  Measles, mumps, and rubella (MMR) vaccine. You may need at least one dose of MMR if you were born in 1957 or later. You may also need a second dose.  Pneumococcal 13-valent conjugate (PCV13) vaccine. You may need this if you have certain conditions and were not previously vaccinated.  Pneumococcal polysaccharide (PPSV23) vaccine. You may need one or two doses if you smoke cigarettes or if you have certain conditions.  Meningococcal vaccine. You may need this if you have certain conditions.  Hepatitis A vaccine. You may need this if you have certain conditions or if you travel or work in places where you may be exposed to hepatitis A.  Hepatitis B vaccine. You may need this if you have certain conditions or if you travel or work in places where you may be exposed to hepatitis B.  Haemophilus influenzae type b (Hib) vaccine. You may need this if you have certain conditions. Talk to your health care provider about which screenings and vaccines you need and how often you need them. This information is not intended to replace advice given to you by your health care provider. Make sure you discuss any questions you have with your health care provider. Document Released: 07/05/2015 Document Revised: 07/29/2017 Document  Reviewed: 04/09/2015 Elsevier Interactive Patient Education  2019 [employer].

## 2018-12-13 NOTE — Assessment & Plan Note (Addendum)
Managed on Tramadol for years for headache prevention. Failed treatment on amitriptyline and topiramate, once on Botox injections. Discussed today that we need to get her off of Tramadol given the addictive nature.  Given that she's failed numerous treatments we will send her back to neurology for evaluation. Referral placed. Discussed to work on weaning off of Tramadol.

## 2018-12-13 NOTE — Progress Notes (Signed)
Subjective:    Patient ID: Betty Hurst, female    DOB: 12-Feb-1956, 63 y.o.   MRN: [id number]  HPI  Betty Hurst is a 63 year old female who presents today for complete physical. She would also like treatment for her chronic, congested, wet cough.   Immunizations: -Tetanus: Completed in 2010 -Influenza: Due this season -Pneumonia: Completed in 2016 -Shingles: Never completed   Diet: She endorses a healthy diet. She eats plenty vegetables, lean protein, starch. No fried food or sweets/desserts. She drinks diet soda, water, Powerade Zero Exercise: She is walking daily, active outside  Eye exam: Completed 2 years ago Dental exam: No recent exam Colonoscopy: Completed in 2013, due in 2023 Pap Smear: Hysterectomy  Mammogram: Due Hep C Screen: Negative Lung Cancer Screening: Never completed. Smokes 1/2 PPD for the last 20 years.    Review of Systems  Constitutional: Negative for unexpected weight change.  HENT: Positive for postnasal drip. Negative for rhinorrhea.   Respiratory: Positive for cough. Negative for shortness of breath.        Chronic, congested, wet cough daily. No fevers, chills. Using albuterol inhaler 4-5 times weekly at night due to cough. Continues to smoke 1/2 PPD of cigarettes. No increased SOB.  Cardiovascular: Negative for chest pain.  Gastrointestinal: Negative for constipation and diarrhea.  Genitourinary: Negative for difficulty urinating and menstrual problem.  Musculoskeletal: Negative for arthralgias and myalgias.  Skin: Negative for rash.  Allergic/Immunologic: Positive for environmental allergies.  Neurological: Negative for dizziness and numbness.       Chronic headaches since sinus surgery in 1994. Mostly located to left temporal region, sometimes radiates to parietal lobe.   She's tried Amitriptyline, Topamax in the past. Had a reaction to amitriptyline.    Psychiatric/Behavioral: The patient is not nervous/anxious.        Past Medical  History:  Diagnosis Date  . Headache   . Small bowel obstruction [facility])    Surgery in 2006     Social History   Socioeconomic History  . Marital status: Married    Spouse name: Not on file  . Number of children: Not on file  . Years of education: Not on file  . Highest education level: Not on file  Occupational History  . Not on file  Social Needs  . Financial resource strain: Not on file  . Food insecurity    Worry: Not on file    Inability: Not on file  . Transportation needs    Medical: Not on file    Non-medical: Not on file  Tobacco Use  . Smoking status: Current Every Day Smoker    Packs/day: 0.50    Types: Cigarettes  . Smokeless tobacco: Never Used  . Tobacco comment: patient is aware that she needs to quit   Substance and Sexual Activity  . Alcohol use: Yes    Alcohol/week: 0.0 standard drinks    Comment: occ  . Drug use: No  . Sexual activity: Yes    Partners: Male  Lifestyle  . Physical activity    Days per week: Not on file    Minutes per session: Not on file  . Stress: Not on file  Relationships  . Social Herbalist on phone: Not on file    Gets together: Not on file    Attends religious service: Not on file    Active member of club or organization: Not on file    Attends meetings of clubs or organizations: Not  on file    Relationship status: Not on file  . Intimate partner violence    Fear of current or ex partner: Not on file    Emotionally abused: Not on file    Physically abused: Not on file    Forced sexual activity: Not on file  Other Topics Concern  . Not on file  Social History Narrative   Married.   Lives in [place].   2 daughters, 3 grandchildren.   On disability from work.   Enjoys being outside, gardening, volunteers at [employer].    Past Surgical History:  Procedure Laterality Date  . HEMORROIDECTOMY    . NASAL SINUS SURGERY     14 surgeries  . OTHER SURGICAL HISTORY     Bladder sling  . SMALL  INTESTINE SURGERY     Small bowel obstruction in 2006  . TONSILLECTOMY      Family History  Problem Relation Age of Onset  . Stroke Father        Deceased  . Renal Disease Paternal Grandfather   . Heart failure Paternal Grandfather     Allergies  Allergen Reactions  . Amitriptyline   . Influenza Vaccines Hives  . Morphine Hives and Swelling  . Cefuroxime Axetil Rash    Current Outpatient Medications on File Prior to Visit  Medication Sig Dispense Refill  . albuterol (VENTOLIN HFA) 108 (90 Base) MCG/ACT inhaler INHALE 2 PUFFS INTO THE LUNGS EVERY 4 (FOUR) HOURS AS NEEDED FOR WHEEZING OR SHORTNESS OF BREATH. 8.5 Inhaler 0  . traMADol (ULTRAM) 50 MG tablet TAKE 1/2 TO 1 TABLET BY MOUTH ONCE DAILY AS NEEDED FOR HEADACHE. 30 tablet 2   No current facility-administered medications on file prior to visit.     BP 120/80   Pulse 84   Temp 98.1 F (36.7 C) (Oral)   Ht 5\' 1"  (1.549 m)   Wt 114 lb 8 oz (51.9 kg)   SpO2 97%   BMI 21.63 kg/m    Objective:   Physical Exam  Constitutional: She is oriented to person, place, and time. She appears well-nourished.  HENT:  Mouth/Throat: No oropharyngeal exudate.  Eyes: Pupils are equal, round, and reactive to light. EOM are normal.  Neck: Neck supple. No thyromegaly present.  Cardiovascular: Normal rate and regular rhythm.  Respiratory: Effort normal and breath sounds normal.  Infrequent deep congested/wet sounding cough  GI: Soft. Bowel sounds are normal. There is no abdominal tenderness.  Musculoskeletal: Normal range of motion.  Neurological: She is alert and oriented to person, place, and time.  Skin: Skin is warm and dry.  Psychiatric: She has a normal mood and affect.           Assessment & Plan:

## 2018-12-14 ENCOUNTER — Telehealth: Payer: Self-pay | Admitting: *Deleted

## 2018-12-14 LAB — LIPID PANEL
Cholesterol: 186 mg/dL (ref 0–200)
HDL: 99.2 mg/dL (ref >39.00)
LDL Cholesterol: 71 mg/dL (ref 0–99)
NonHDL: 86.78
Total CHOL/HDL Ratio: 2
Triglycerides: 77 mg/dL (ref 0.0–149.0)
VLDL: 15.4 mg/dL (ref 0.0–40.0)

## 2018-12-14 LAB — COMPREHENSIVE METABOLIC PANEL
ALT: 13 U/L (ref 0–35)
AST: 22 U/L (ref 0–37)
Albumin: 4.5 g/dL (ref 3.5–5.2)
Alkaline Phosphatase: 47 U/L (ref 39–117)
BUN: 9 mg/dL (ref 6–23)
CO2: 27 mEq/L (ref 19–32)
Calcium: 9.7 mg/dL (ref 8.4–10.5)
Chloride: 95 mEq/L — ABNORMAL LOW (ref 96–112)
Creatinine, Ser: 0.76 mg/dL (ref 0.40–1.20)
GFR: 77.44 mL/min (ref >60.00)
Glucose, Bld: 94 mg/dL (ref 70–99)
Potassium: 5.1 mEq/L (ref 3.5–5.1)
Sodium: 129 mEq/L — ABNORMAL LOW (ref 135–145)
Total Bilirubin: 0.5 mg/dL (ref 0.2–1.2)
Total Protein: 7.4 g/dL (ref 6.0–8.3)

## 2018-12-14 NOTE — Telephone Encounter (Signed)
Left message for patient to notify them that it is time to schedule annual low dose lung cancer screening CT scan. Instructed patient to call back to verify information prior to the scan being scheduled.  

## 2018-12-15 ENCOUNTER — Other Ambulatory Visit: Payer: Self-pay | Admitting: Primary Care

## 2018-12-15 DIAGNOSIS — E871 Hypo-osmolality and hyponatremia: Secondary | ICD-10-CM

## 2018-12-16 ENCOUNTER — Telehealth: Payer: Self-pay | Admitting: *Deleted

## 2018-12-16 NOTE — Telephone Encounter (Signed)
Received referral for low dose lung cancer screening CT scan. Message left at phone number listed in EMR for patient to call me back to facilitate scheduling scan.  

## 2018-12-19 ENCOUNTER — Telehealth: Payer: Self-pay | Admitting: *Deleted

## 2018-12-19 NOTE — Telephone Encounter (Signed)
Contacted patient regarding lung screening scan. Patient reviewed smoking history which includes .5ppd x 20 years. Patient is made aware of eligibility requirement for lung screening of 30 pack year. Patient is aware that she does not meet criteria but can still participate in [place] smoking cessation program if needed.

## 2019-01-17 ENCOUNTER — Ambulatory Visit: Payer: Self-pay | Admitting: Neurology

## 2019-02-17 ENCOUNTER — Other Ambulatory Visit: Payer: Self-pay | Admitting: Primary Care

## 2019-02-17 DIAGNOSIS — G8929 Other chronic pain: Secondary | ICD-10-CM

## 2019-02-17 NOTE — Telephone Encounter (Signed)
Last prescribed on 11/17/2018. Last appointment on 12/13/2018. Next future appointment on 12/18/2019

## 2019-02-20 ENCOUNTER — Ambulatory Visit (INDEPENDENT_AMBULATORY_CARE_PROVIDER_SITE_OTHER): Payer: Medicare Other | Admitting: Neurology

## 2019-02-20 ENCOUNTER — Telehealth: Payer: Self-pay | Admitting: Neurology

## 2019-02-20 ENCOUNTER — Encounter: Payer: Self-pay | Admitting: Neurology

## 2019-02-20 ENCOUNTER — Other Ambulatory Visit: Payer: Self-pay

## 2019-02-20 VITALS — BP 156/80 | HR 81 | Temp 97.7°F | Ht 61.0 in | Wt 114.0 lb

## 2019-02-20 DIAGNOSIS — G43711 Chronic migraine without aura, intractable, with status migrainosus: Secondary | ICD-10-CM

## 2019-02-20 MED ORDER — EMGALITY 120 MG/ML ~~LOC~~ SOAJ: 120.0000 mg | SUBCUTANEOUS | 11 refills | Status: DC

## 2019-02-20 NOTE — Telephone Encounter (Signed)
Dx G43.711. 

## 2019-02-20 NOTE — Telephone Encounter (Signed)
botox start form ready for MD signature. Will get pt written consent at first botox appointment.

## 2019-02-20 NOTE — Progress Notes (Signed)
[facility]    Provider:  Dr Jaynee Eagles Requesting Provider: [name], NP Primary Care Provider:  [name], NP  CC: Chronic headaches "24 x 7"  HPI:  Betty Hurst is a 63 y.o. female here as requested by [name], NP for headache, PHx headache, SBO, bronchitis, long-term current smoker, cough. She has had headaches since frontal sinus surgery in 1996. She saw Dr. Beatris Si at [facility] for years and tried and failed multiple medications, went to pain management, since 1991. Migraines on the left around the eye, pulsating, pounding, throbbing, severe, light and sound sensitivity, nausea, no vomiting. Headache 24x7. She wakes with headaches, she is tired during the day, she has asthma, quit smoking 20 years ago. She had a sleep eval in the past and was not abnormal. SHe has 15 migraine days a month can last 24 hours, imitrex helps, no medication overuse, noaura ongoing for several years at this frequency and severity.   Failed multiple medications: Zanaflex, Flexeril, tramadol, topiramate, amitriptyline, nortriptyline, depakote, effexor, propranolol and many more medications she can't remember.   Reviewed notes, labs and imaging from outside physicians, which showed:  I reviewed Dr. Ainsley Spinner notes.  Patient has been managed on tramadol for years for headache prevention.  Failed amitriptyline and topiramate, once on Botox injections, trying to get her off tramadol given the addictive nature.  She is failed numerous treatments and she was referred to neurology for evaluation.  She is also a current every day smoker. I reviewed Dr. Georgie Chard notes and he performed botox again but onkl one time.  cmp with low sodium 11/2018, f/u with pcp  Reviewed ct sinuses 1991: postop changes ethmoid and steomeatal region, sinus disease  Review of Systems: Patient complains of symptoms per HPI as well as the following symptoms:neck pain. Pertinent negatives and positives per  HPI. All others negative.   Social History   Socioeconomic History  . Marital status: Married    Spouse name: Not on file  . Number of children: 2  . Years of education: 2 years college  . Highest education level: Not on file  Occupational History  . Not on file  Social Needs  . Financial resource strain: Not on file  . Food insecurity    Worry: Not on file    Inability: Not on file  . Transportation needs    Medical: Not on file    Non-medical: Not on file  Tobacco Use  . Smoking status: Current Every Day Smoker    Packs/day: 0.50    Types: Cigarettes  . Smokeless tobacco: Never Used  . Tobacco comment: patient is aware that she needs to quit   Substance and Sexual Activity  . Alcohol use: Yes    Alcohol/week: 0.0 standard drinks    Comment: occ  . Drug use: No  . Sexual activity: Yes    Partners: Male  Lifestyle  . Physical activity    Days per week: Not on file    Minutes per session: Not on file  . Stress: Not on file  Relationships  . Social Herbalist on phone: Not on file    Gets together: Not on file    Attends religious service: Not on file    Active member of club or organization: Not on file    Attends meetings of clubs or organizations: Not on file    Relationship status: Not on file  . Intimate partner violence    Fear  of current or ex partner: Not on file    Emotionally abused: Not on file    Physically abused: Not on file    Forced sexual activity: Not on file  Other Topics Concern  . Not on file  Social History Narrative   Married.   Lives in [place] with her husband.   2 daughters, 3 grandchildren.   On disability from work.   Enjoys being outside, gardening, volunteers at [employer].   Caffeine: diet coke    Family History  Problem Relation Age of Onset  . Stroke Father        Deceased  . Renal Disease Paternal Grandfather   . Heart failure Paternal Grandfather   . Heart attack Paternal Grandfather   .  Kidney disease Maternal Grandfather   . Stroke Paternal Grandmother   . Migraines Neg Hx     Past Medical History:  Diagnosis Date  . Asthma   . Frequent sinus infections   . Headache   . Migraine   . Small bowel obstruction [facility])    Surgery in 2006    Patient Active Problem List   Diagnosis Date Noted  . Preventative health care 12/09/2015  . Pulmonary emphysema ([place]) 08/12/2015  . Intractable chronic migraine without aura and without status migrainosus 01/08/2015  . Medicare annual wellness visit, subsequent 11/26/2014  . Tobacco abuse 11/26/2014  . Headache, migraine 09/25/2014  . Vaginal cyst 09/25/2014    Past Surgical History:  Procedure Laterality Date  . HEMORROIDECTOMY    . NASAL SINUS SURGERY     14 surgeries  . OTHER SURGICAL HISTORY     Bladder sling  . SMALL INTESTINE SURGERY     Small bowel obstruction in 2006  . TONSILLECTOMY      Current Outpatient Medications  Medication Sig Dispense Refill  . albuterol (VENTOLIN HFA) 108 (90 Base) MCG/ACT inhaler INHALE 2 PUFFS INTO THE LUNGS EVERY 4 (FOUR) HOURS AS NEEDED FOR WHEEZING OR SHORTNESS OF BREATH. 8.5 Inhaler 0  . tiotropium (SPIRIVA HANDIHALER) 18 MCG inhalation capsule Place 1 capsule (18 mcg total) into inhaler and inhale daily. For COPD 30 capsule 2  . traMADol (ULTRAM) 50 MG tablet TAKE 1/2 TO 1 TABLET BY MOUTH ONCE DAILY AS NEEDED FOR HEADACHE. 30 tablet 2  . Galcanezumab-gnlm (EMGALITY) 120 MG/ML SOAJ Inject 120 mg into the skin every 30 (thirty) days. 1 pen 11   No current facility-administered medications for this visit.     Allergies as of 02/20/2019 - Review Complete 02/20/2019  Allergen Reaction Noted  . Amitriptyline  12/13/2018  . Influenza vaccines Hives 11/26/2014  . Morphine Hives and Swelling 09/25/2014  . Cefuroxime axetil Rash 09/25/2014    Vitals: BP (!) 156/80 (BP Location: Right Arm, Patient Position: Sitting)   Pulse 81   Temp 97.7 F (36.5 C) Comment: taken by check in  staff  Ht 5\' 1"  (1.549 m)   Wt 114 lb (51.7 kg)   BMI 21.54 kg/m  Last Weight:  Wt Readings from Last 1 Encounters:  02/20/19 114 lb (51.7 kg)   Last Height:   Ht Readings from Last 1 Encounters:  02/20/19 5\' 1"  (1.549 m)     Physical exam: Exam: Gen: NAD, conversant, well nourised, thin , well groomed                     CV: RRR, no MRG. No Carotid Bruits. No peripheral edema, warm, nontender Eyes: Conjunctivae clear without exudates or hemorrhage  Neuro: Detailed Neurologic Exam  Speech:    Speech is normal; fluent and spontaneous with normal comprehension.  Cognition:    The patient is oriented to person, place, and time;     recent and remote memory intact;     language fluent;     normal attention, concentration,     fund of knowledge Cranial Nerves:    The pupils are equal, round, and reactive to light. Attempted fundoscopic exam could not visualize. Visual fields are full to finger confrontation. Extraocular movements are intact. Trigeminal sensation is intact and the muscles of mastication are normal. The face is symmetric. The palate elevates in the midline. Hearing intact. Voice is normal. Shoulder shrug is normal. The tongue has normal motion without fasciculations.   Coordination:    No dysmetria  Gait:    Not ataxic  Motor Observation:    No asymmetry, no atrophy, and no involuntary movements noted. Tone:    Normal muscle tone.    Posture:    Posture is normal. normal erect    Strength:    Strength is V/V in the upper and lower limbs.      Sensation: intact to LT     Reflex Exam:  DTR's:    Deep tendon reflexes in the upper and lower extremities are symmetrical bilaterally.   Toes:    The toes are downgoing bilaterally.   Clonus:    Clonus is absent.    Assessment/Plan:  62 with chronic intractable headaches. Did well on botox in the past, restart.   Provided Emgality samples for 3 months   Discussed: To prevent or relieve headaches,  try the following: Cool Compress. Lie down and place a cool compress on your head.  Avoid headache triggers. If certain foods or odors seem to have triggered your migraines in the past, avoid them. A headache diary might help you identify triggers.  Include physical activity in your daily routine. Try a daily walk or other moderate aerobic exercise.  Manage stress. Find healthy ways to cope with the stressors, such as delegating tasks on your to-do list.  Practice relaxation techniques. Try deep breathing, yoga, massage and visualization.  Eat regularly. Eating regularly scheduled meals and maintaining a healthy diet might help prevent headaches. Also, drink plenty of fluids.  Follow a regular sleep schedule. Sleep deprivation might contribute to headaches Consider biofeedback. With this mind-body technique, you learn to control certain bodily functions - such as muscle tension, heart rate and blood pressure - to prevent headaches or reduce headache pain.    Proceed to emergency room if you experience new or worsening symptoms or symptoms do not resolve, if you have new neurologic symptoms or if headache is severe, or for any concerning symptom.   Provided education and documentation from American headache Society toolbox including articles on: chronic migraine medication overuse headache, chronic migraines, prevention of migraines, behavioral and other nonpharmacologic treatments for headache.  Cc: [name], NP,    Sarina Ill, MD  [facility] [address] [place], [place] [address]  Phone [phone] Fax [phone]

## 2019-02-20 NOTE — Telephone Encounter (Signed)
Patient new start for botox for migraines. Please start process and schedule thanks

## 2019-02-21 NOTE — Telephone Encounter (Signed)
Please notify patient that I see she is going to start Botox for recurrent headaches. Our goal is to come off of tramadol and find other methods for headache management. Where we at with this?

## 2019-02-22 NOTE — Telephone Encounter (Signed)
I called to schedule the patient but she did not answer so I left her a VM asking her to call me back. DW

## 2019-02-22 NOTE — Telephone Encounter (Signed)
Rollene Fare, can you refill for patient.  Per patient message on MyChart.      My pharmacy said they have not heard anything on the refill of the [facility] just checking on this. I don't go back to neurologist for 3 months to start on Botox to see if that will help with my headaches. Thank you

## 2019-03-09 ENCOUNTER — Other Ambulatory Visit: Payer: Self-pay | Admitting: Primary Care

## 2019-03-09 DIAGNOSIS — J439 Emphysema, unspecified: Secondary | ICD-10-CM

## 2019-03-28 ENCOUNTER — Encounter: Payer: Self-pay | Admitting: Primary Care

## 2019-03-28 ENCOUNTER — Ambulatory Visit (INDEPENDENT_AMBULATORY_CARE_PROVIDER_SITE_OTHER): Payer: Medicare Other | Admitting: Primary Care

## 2019-03-28 VITALS — Temp 98.1°F | Ht 61.0 in | Wt 114.0 lb

## 2019-03-28 DIAGNOSIS — J019 Acute sinusitis, unspecified: Secondary | ICD-10-CM | POA: Diagnosis not present

## 2019-03-28 MED ORDER — AZITHROMYCIN 250 MG PO TABS: ORAL_TABLET | ORAL | 0 refills | Status: DC

## 2019-03-28 NOTE — Assessment & Plan Note (Signed)
Symptoms similar to typical sinusitis infections for which she contracts semi-annually.  Discussed the typical course for sinusitis and that hers may be viral. She would like to proceed with antibiotic treatment given her history.   Agreed given evidence of purulent nasal drainage with HPI. Also recommended follow up with a local dentist for her oral bridge.

## 2019-03-28 NOTE — Progress Notes (Signed)
Subjective:    Patient ID: Betty Hurst, female    DOB: December 24, 1955, 63 y.o.   MRN: [id number]  HPI     KENNEDEY ZEMBOWER - 62 y.o. female  MRN [id number]  Date of Birth: Oct 28, 1955  PCP: [name], NP  This service was provided via telemedicine. Phone Visit performed on 03/28/2019    Rationale for phone visit along with limitations reviewed. Patient consented to telephone encounter.    Location of patient: Home Location of provider: Office at [employer] @ [facility] Name of referring provider: N/A   Names of persons and role in encounter: Provider: [name], NP  Patient: Betty Hurst  Other: N/A   Time on call: 8 min - 43 sec   Subjective: Chief Complaint  Patient presents with  . Cough    productive cough  . Nasal Congestion  . Sinusitis     HPI:  Ms. Mercy is a 63 year old female with a history of migraines, pulmonary emphysema, tobacco abuse, sinusitis who presents today with a chief complaint of sinus pressure.  Her pressure is located to the right maxillary sinus. She also reports nasal congestion, facial and dental pressure, expulsion of thick green mucous from her nasal cavity.   She has a permanent bridge to her oral cavity for which is also swollen. She is in the process of finding a new dentist for evaluation.   She's been taking Alka-Seltzer Sinus Plus and Tylenol with some improvement but without resolve. Symptoms  Began three days ago.  Objective/Observations:  No physical exam or vital signs collected unless specifically identified below.   Temp 98.1 F (36.7 C) (Oral)   Ht 5\' 1"  (1.549 m)   Wt 114 lb (51.7 kg)   BMI 21.54 kg/m    Respiratory status: speaks in complete sentences without evident shortness of breath.   Assessment/Plan:  See problem based charting.  No problem-specific Assessment & Plan notes found for this encounter.   I discussed the assessment and treatment plan with the patient. The patient was  provided an opportunity to ask questions and all were answered. The patient agreed with the plan and demonstrated an understanding of the instructions.  Lab Orders  No laboratory test(s) ordered today    No orders of the defined types were placed in this encounter.   The patient was advised to call back or seek an in-person evaluation if the symptoms worsen or if the condition fails to improve as anticipated.  [name], NP    Review of Systems  Constitutional: Negative for fever.  HENT: Positive for congestion, sinus pressure and sinus pain. Negative for sore throat.   Respiratory: Negative for cough.        Past Medical History:  Diagnosis Date  . Asthma   . Frequent sinus infections   . Headache   . Migraine   . Small bowel obstruction [facility])    Surgery in 2006     Social History   Socioeconomic History  . Marital status: Married    Spouse name: Not on file  . Number of children: 2  . Years of education: 2 years college  . Highest education level: Not on file  Occupational History  . Not on file  Social Needs  . Financial resource strain: Not on file  . Food insecurity    Worry: Not on file    Inability: Not on file  . Transportation needs    Medical: Not on  file    Non-medical: Not on file  Tobacco Use  . Smoking status: Current Every Day Smoker    Packs/day: 0.50    Types: Cigarettes  . Smokeless tobacco: Never Used  . Tobacco comment: patient is aware that she needs to quit   Substance and Sexual Activity  . Alcohol use: Yes    Alcohol/week: 0.0 standard drinks    Comment: occ  . Drug use: No  . Sexual activity: Yes    Partners: Male  Lifestyle  . Physical activity    Days per week: Not on file    Minutes per session: Not on file  . Stress: Not on file  Relationships  . Social Herbalist on phone: Not on file    Gets together: Not on file    Attends religious service: Not on file    Active member of club or organization:  Not on file    Attends meetings of clubs or organizations: Not on file    Relationship status: Not on file  . Intimate partner violence    Fear of current or ex partner: Not on file    Emotionally abused: Not on file    Physically abused: Not on file    Forced sexual activity: Not on file  Other Topics Concern  . Not on file  Social History Narrative   Married.   Lives in [place] with her husband.   2 daughters, 3 grandchildren.   On disability from work.   Enjoys being outside, gardening, volunteers at [employer].   Caffeine: diet coke    Past Surgical History:  Procedure Laterality Date  . HEMORROIDECTOMY    . NASAL SINUS SURGERY     14 surgeries  . OTHER SURGICAL HISTORY     Bladder sling  . SMALL INTESTINE SURGERY     Small bowel obstruction in 2006  . TONSILLECTOMY      Family History  Problem Relation Age of Onset  . Stroke Father        Deceased  . Renal Disease Paternal Grandfather   . Heart failure Paternal Grandfather   . Heart attack Paternal Grandfather   . Kidney disease Maternal Grandfather   . Stroke Paternal Grandmother   . Migraines Neg Hx     Allergies  Allergen Reactions  . Amitriptyline   . Influenza Vaccines Hives  . Morphine Hives and Swelling  . Cefuroxime Axetil Rash    Current Outpatient Medications on File Prior to Visit  Medication Sig Dispense Refill  . albuterol (VENTOLIN HFA) 108 (90 Base) MCG/ACT inhaler INHALE 2 PUFFS INTO THE LUNGS EVERY 4 (FOUR) HOURS AS NEEDED FOR WHEEZING OR SHORTNESS OF BREATH. 8.5 Inhaler 0  . Galcanezumab-gnlm (EMGALITY) 120 MG/ML SOAJ Inject 120 mg into the skin every 30 (thirty) days. 1 pen 11  . SPIRIVA HANDIHALER 18 MCG inhalation capsule PLACE 1 CAPSULE (18 MCG TOTAL) INTO INHALER AND INHALE DAILY. FOR COPD 30 capsule 2  . traMADol (ULTRAM) 50 MG tablet TAKE 1/2 TO 1 TABLET BY MOUTH ONCE DAILY AS NEEDED FOR HEADACHE. 30 tablet 2   No current facility-administered medications on file  prior to visit.     Temp 98.1 F (36.7 C) (Oral)   Ht 5\' 1"  (1.549 m)   Wt 114 lb (51.7 kg)   BMI 21.54 kg/m    Objective:   Physical Exam  Constitutional: She is oriented to person, place, and time.  HENT:  Sounds congested during visit  Respiratory: No respiratory distress.  Neurological: She is alert and oriented to person, place, and time.  Psychiatric: She has a normal mood and affect.           Assessment & Plan:

## 2019-03-28 NOTE — Patient Instructions (Signed)
Start Azithromycin antibiotics for infection. Take 2 tablets by mouth today, then 1 tablet daily for 4 additional days.  Nasal Congestion/Ear Pressure/Sinus Pressure: Try using Flonase (fluticasone) nasal spray. Instill 1 spray in each nostril twice daily.   Follow up with the dentist as discussed.   It was a pleasure to see you today!

## 2019-06-06 ENCOUNTER — Other Ambulatory Visit: Payer: Self-pay | Admitting: Primary Care

## 2019-06-06 DIAGNOSIS — J439 Emphysema, unspecified: Secondary | ICD-10-CM

## 2019-06-29 ENCOUNTER — Other Ambulatory Visit: Payer: Self-pay | Admitting: Internal Medicine

## 2019-06-29 DIAGNOSIS — G8929 Other chronic pain: Secondary | ICD-10-CM

## 2019-06-29 NOTE — Telephone Encounter (Signed)
Last filled 02/22/2019 with 2 refills... last OV acute 03/28/2019.... OV scheduled 12/18/2019... please advise

## 2019-06-29 NOTE — Telephone Encounter (Signed)
See my chart message

## 2019-07-25 DIAGNOSIS — J439 Emphysema, unspecified: Secondary | ICD-10-CM

## 2019-07-26 MED ORDER — SPIRIVA HANDIHALER 18 MCG IN CAPS: 18.0000 ug | ORAL_CAPSULE | Freq: Every day | RESPIRATORY_TRACT | 1 refills | Status: DC

## 2019-08-18 ENCOUNTER — Other Ambulatory Visit: Payer: Self-pay | Admitting: Primary Care

## 2019-08-18 DIAGNOSIS — R519 Headache, unspecified: Secondary | ICD-10-CM

## 2019-08-18 NOTE — Telephone Encounter (Signed)
Electronic refill request Tramadol Last refill 06/30/19 #30 Last office visit 03/28/19

## 2019-08-21 ENCOUNTER — Telehealth: Payer: Self-pay | Admitting: Family Medicine

## 2019-08-21 NOTE — Telephone Encounter (Signed)
Pt. Son in law passed away and is having to go out of town and would like to know if she can r/s her BOTOX appt. that she has on 09/11/19. Please advise.

## 2019-08-24 NOTE — Telephone Encounter (Signed)
I called to schedule the patient but she did not answer so I left a VM asking her to call me back.

## 2019-09-11 ENCOUNTER — Ambulatory Visit: Payer: Medicare Other | Admitting: Family Medicine

## 2019-10-02 ENCOUNTER — Ambulatory Visit: Payer: Medicare Other | Attending: Internal Medicine

## 2019-10-02 DIAGNOSIS — Z23 Encounter for immunization: Secondary | ICD-10-CM

## 2019-10-02 NOTE — Progress Notes (Signed)
   Covid-19 Vaccination Clinic  Name:  MACKENZE KOZUCH    MRN: [id number] DOB: 01/26/1956  10/02/2019  Ms. Salois was observed post Covid-19 immunization for 15 minutes without incident. She was provided with Vaccine Information Sheet and instruction to access the V-Safe system.   Ms. Kilman was instructed to call 911 with any severe reactions post vaccine: Marland Kitchen Difficulty breathing  . Swelling of face and throat  . A fast heartbeat  . A bad rash all over body  . Dizziness and weakness   Immunizations Administered    Name Date Dose VIS Date Route   Pfizer COVID-19 Vaccine 10/02/2019 12:38 PM 0.3 mL 06/02/2019 Intramuscular   Manufacturer: [place]   Lot: [id number]   [place]: [id number]

## 2019-10-20 ENCOUNTER — Other Ambulatory Visit: Payer: Self-pay | Admitting: Internal Medicine

## 2019-10-20 DIAGNOSIS — G8929 Other chronic pain: Secondary | ICD-10-CM

## 2019-10-23 ENCOUNTER — Ambulatory Visit: Payer: Medicare Other

## 2019-10-23 ENCOUNTER — Ambulatory Visit: Payer: Medicare Other | Attending: Internal Medicine

## 2019-10-23 DIAGNOSIS — Z23 Encounter for immunization: Secondary | ICD-10-CM

## 2019-10-23 NOTE — Progress Notes (Signed)
   Covid-19 Vaccination Clinic  Name:  JANARA RUFFINI    MRN: [id number] DOB: Sep 01, 1955  10/23/2019  Ms. Seabrook was observed post Covid-19 immunization for 15 minutes without incident. She was provided with Vaccine Information Sheet and instruction to access the V-Safe system.   Ms. Walsh was instructed to call 911 with any severe reactions post vaccine: Marland Kitchen Difficulty breathing  . Swelling of face and throat  . A fast heartbeat  . A bad rash all over body  . Dizziness and weakness   Immunizations Administered    Name Date Dose VIS Date Route   Pfizer COVID-19 Vaccine 10/23/2019  4:27 PM 0.3 mL 08/16/2018 Intramuscular   Manufacturer: [place]   Lot: [id number]   [place]: [id number]

## 2019-10-24 NOTE — Telephone Encounter (Signed)
Per DPR, left detail message of [name]'s comments for patient to call back 

## 2019-10-24 NOTE — Telephone Encounter (Signed)
Last prescribed on 08/18/2019 #30 with 0 refills. Last OV on 03/28/2019 (acute). No future OV.

## 2019-10-24 NOTE — Telephone Encounter (Signed)
How is she doing with headache treatment? The plan is to stop the Tramadol. Let me know.

## 2019-10-25 NOTE — Telephone Encounter (Signed)
Patient returned phone call. She states that [facility] is back logged on their Botox, and she was told it would be awhile before she can have that done again for her headache tx. She is wanting a refill on Tramadol to have on hand until she can have the botox treatment again.

## 2019-10-26 NOTE — Telephone Encounter (Signed)
Per DPR, left detail message of [name]'s comments for patient. 

## 2019-10-26 NOTE — Telephone Encounter (Signed)
Please thank patient for the update.  I can provide her with a temporary supply to get her through. We can discuss further during her upcoming visit in June.

## 2019-11-23 DIAGNOSIS — Z720 Tobacco use: Secondary | ICD-10-CM

## 2019-11-27 MED ORDER — NICOTINE 21 MG/24HR TD PT24: 21.0000 mg | MEDICATED_PATCH | Freq: Every day | TRANSDERMAL | 0 refills | Status: DC

## 2019-12-18 ENCOUNTER — Other Ambulatory Visit: Payer: Self-pay

## 2019-12-18 ENCOUNTER — Encounter: Payer: Self-pay | Admitting: Primary Care

## 2019-12-18 ENCOUNTER — Ambulatory Visit (INDEPENDENT_AMBULATORY_CARE_PROVIDER_SITE_OTHER): Payer: Medicare Other | Admitting: Primary Care

## 2019-12-18 VITALS — BP 122/82 | HR 83 | Ht 61.0 in | Wt 110.0 lb

## 2019-12-18 DIAGNOSIS — E871 Hypo-osmolality and hyponatremia: Secondary | ICD-10-CM | POA: Diagnosis not present

## 2019-12-18 DIAGNOSIS — G8929 Other chronic pain: Secondary | ICD-10-CM

## 2019-12-18 DIAGNOSIS — R519 Headache, unspecified: Secondary | ICD-10-CM

## 2019-12-18 DIAGNOSIS — Z0000 Encounter for general adult medical examination without abnormal findings: Secondary | ICD-10-CM

## 2019-12-18 DIAGNOSIS — G43709 Chronic migraine without aura, not intractable, without status migrainosus: Secondary | ICD-10-CM

## 2019-12-18 DIAGNOSIS — Z720 Tobacco use: Secondary | ICD-10-CM

## 2019-12-18 DIAGNOSIS — Z1231 Encounter for screening mammogram for malignant neoplasm of breast: Secondary | ICD-10-CM

## 2019-12-18 DIAGNOSIS — Z114 Encounter for screening for human immunodeficiency virus [HIV]: Secondary | ICD-10-CM

## 2019-12-18 DIAGNOSIS — J439 Emphysema, unspecified: Secondary | ICD-10-CM

## 2019-12-18 MED ORDER — TRAMADOL HCL 50 MG PO TABS: ORAL_TABLET | ORAL | 0 refills | Status: DC

## 2019-12-18 NOTE — Assessment & Plan Note (Signed)
Did not qualify for lung cancer screening. Encourage cessation.

## 2019-12-18 NOTE — Assessment & Plan Note (Addendum)
Following with neurology, doing Emgality every 30 days. She is to start Botox soon, she is working on getting in touch with Neurology. She's cut back on Tramadol use, discussed that goal is to discontinue completely. Refill provided today, this should last three months.

## 2019-12-18 NOTE — Patient Instructions (Signed)
Stop by the lab prior to leaving today. I will notify you of your results once received.   Continue to work on a healthy diet. Ensure you are consuming 64 ounces of water daily.  Exercise regularly.   Call the breast center to schedule your mammogram.  It was a pleasure to see you today!   Preventive Care 24-62 Years Old, Female Preventive care refers to visits with your health care provider and lifestyle choices that can promote health and wellness. This includes:  A yearly physical exam. This may also be called an annual well check.  Regular dental visits and eye exams.  Immunizations.  Screening for certain conditions.  Healthy lifestyle choices, such as eating a healthy diet, getting regular exercise, not using drugs or products that contain nicotine and tobacco, and limiting alcohol use. What can I expect for my preventive care visit? Physical exam Your health care provider will check your:  Height and weight. This may be used to calculate body mass index (BMI), which tells if you are at a healthy weight.  Heart rate and blood pressure.  Skin for abnormal spots. Counseling Your health care provider may ask you questions about your:  Alcohol, tobacco, and drug use.  Emotional well-being.  Home and relationship well-being.  Sexual activity.  Eating habits.  Work and work Statistician.  Method of birth control.  Menstrual cycle.  Pregnancy history. What immunizations do I need?  Influenza (flu) vaccine  This is recommended every year. Tetanus, diphtheria, and pertussis (Tdap) vaccine  You may need a Td booster every 10 years. Varicella (chickenpox) vaccine  You may need this if you have not been vaccinated. Zoster (shingles) vaccine  You may need this after age 67. Measles, mumps, and rubella (MMR) vaccine  You may need at least one dose of MMR if you were born in 1957 or later. You may also need a second dose. Pneumococcal conjugate (PCV13)  vaccine  You may need this if you have certain conditions and were not previously vaccinated. Pneumococcal polysaccharide (PPSV23) vaccine  You may need one or two doses if you smoke cigarettes or if you have certain conditions. Meningococcal conjugate (MenACWY) vaccine  You may need this if you have certain conditions. Hepatitis A vaccine  You may need this if you have certain conditions or if you travel or work in places where you may be exposed to hepatitis A. Hepatitis B vaccine  You may need this if you have certain conditions or if you travel or work in places where you may be exposed to hepatitis B. Haemophilus influenzae type b (Hib) vaccine  You may need this if you have certain conditions. Human papillomavirus (HPV) vaccine  If recommended by your health care provider, you may need three doses over 6 months. You may receive vaccines as individual doses or as more than one vaccine together in one shot (combination vaccines). Talk with your health care provider about the risks and benefits of combination vaccines. What tests do I need? Blood tests  Lipid and cholesterol levels. These may be checked every 5 years, or more frequently if you are over 70 years old.  Hepatitis C test.  Hepatitis B test. Screening  Lung cancer screening. You may have this screening every year starting at age 45 if you have a 30-pack-year history of smoking and currently smoke or have quit within the past 15 years.  Colorectal cancer screening. All adults should have this screening starting at age 96 and continuing until age  43. Your health care provider may recommend screening at age 19 if you are at increased risk. You will have tests every 1-10 years, depending on your results and the type of screening test.  Diabetes screening. This is done by checking your blood sugar (glucose) after you have not eaten for a while (fasting). You may have this done every 1-3 years.  Mammogram. This may be  done every 1-2 years. Talk with your health care provider about when you should start having regular mammograms. This may depend on whether you have a family history of breast cancer.  BRCA-related cancer screening. This may be done if you have a family history of breast, ovarian, tubal, or peritoneal cancers.  Pelvic exam and Pap test. This may be done every 3 years starting at age 56. Starting at age 92, this may be done every 5 years if you have a Pap test in combination with an HPV test. Other tests  Sexually transmitted disease (STD) testing.  Bone density scan. This is done to screen for osteoporosis. You may have this scan if you are at high risk for osteoporosis. Follow these instructions at home: Eating and drinking  Eat a diet that includes fresh fruits and vegetables, whole grains, lean protein, and low-fat dairy.  Take vitamin and mineral supplements as recommended by your health care provider.  Do not drink alcohol if: ? Your health care provider tells you not to drink. ? You are pregnant, may be pregnant, or are planning to become pregnant.  If you drink alcohol: ? Limit how much you have to 0-1 drink a day. ? Be aware of how much alcohol is in your drink. In the [place] one drink equals one 12 oz bottle of beer (355 mL), one 5 oz glass of wine (148 mL), or one 1 oz glass of hard liquor (44 mL). Lifestyle  Take daily care of your teeth and gums.  Stay active. Exercise for at least 30 minutes on 5 or more days each week.  Do not use any products that contain nicotine or tobacco, such as cigarettes, e-cigarettes, and chewing tobacco. If you need help quitting, ask your health care provider.  If you are sexually active, practice safe sex. Use a condom or other form of birth control (contraception) in order to prevent pregnancy and STIs (sexually transmitted infections).  If told by your health care provider, take low-dose aspirin daily starting at age 49. What's  next?  Visit your health care provider once a year for a well check visit.  Ask your health care provider how often you should have your eyes and teeth checked.  Stay up to date on all vaccines. This information is not intended to replace advice given to you by your health care provider. Make sure you discuss any questions you have with your health care provider. Document Revised: 02/17/2018 Document Reviewed: 02/17/2018 Elsevier Patient Education  2020 [employer].

## 2019-12-18 NOTE — Assessment & Plan Note (Signed)
Well-controlled on Spiriva.  Using albuterol infrequently.  Continue to monitor.

## 2019-12-18 NOTE — Assessment & Plan Note (Signed)
Immunizations up-to-date.  She claims she never had chickenpox. Mammogram overdue, orders placed. Colonoscopy up-to-date, due in 2023. Encouraged a healthy diet, regular exercise. Exam today benign. Labs pending.

## 2019-12-18 NOTE — Progress Notes (Signed)
Subjective:    Patient ID: Betty Hurst, female    DOB: [birth year], 64 y.o.   MRN: [id number]  HPI  This visit occurred during the SARS-CoV-2 public health emergency.  Safety protocols were in place, including screening questions prior to the visit, additional usage of staff PPE, and extensive cleaning of exam room while observing appropriate contact time as indicated for disinfecting solutions.   Betty Hurst is a 64 year old female who presents today for complete physical.  Immunizations: -Tetanus: Completed in 2010 -Influenza: Due this season -Shingles: Never completed, never had chicken pox -Pneumonia: Completed last in 2016 -Covid-19: Completed series  Diet: She endorses a fair diet.  Exercise: She is not exercising   Eye exam: Completed in 2020 Dental exam: Completes semi-annually   Pap Smear: Hysterectomy  Mammogram:  No recent mammogram on file Colonoscopy: Completed in 2013, due in 2023 Hep C Screen: Negative Lung Cancer Screening: Didn't qualify   BP Readings from Last 3 Encounters:  12/18/19 122/82  02/20/19 (!) 156/80  12/13/18 120/80     Review of Systems  Constitutional: Negative for unexpected weight change.  HENT: Negative for rhinorrhea.   Respiratory: Negative for cough and shortness of breath.   Cardiovascular: Negative for chest pain.  Gastrointestinal: Negative for constipation and diarrhea.  Genitourinary: Negative for difficulty urinating.  Musculoskeletal: Positive for arthralgias.  Skin: Negative for rash.  Allergic/Immunologic: Positive for environmental allergies.  Neurological: Positive for headaches. Negative for dizziness and numbness.  Psychiatric/Behavioral:       Increased stress with ill husband       Past Medical History:  Diagnosis Date  . Acute non-recurrent sinusitis 10/13/2018  . Asthma   . Frequent sinus infections   . Headache   . Migraine   . Small bowel obstruction [facility])    Surgery in 2006     Social History    Socioeconomic History  . Marital status: Married    Spouse name: Not on file  . Number of children: 2  . Years of education: 2 years college  . Highest education level: Not on file  Occupational History  . Not on file  Tobacco Use  . Smoking status: Current Every Day Smoker    Packs/day: 0.50    Types: Cigarettes  . Smokeless tobacco: Never Used  . Tobacco comment: patient is aware that she needs to quit   Substance and Sexual Activity  . Alcohol use: Yes    Alcohol/week: 0.0 standard drinks    Comment: occ  . Drug use: No  . Sexual activity: Yes    Partners: Male  Other Topics Concern  . Not on file  Social History Narrative   Married.   Lives in [place] with her husband.   2 daughters, 3 grandchildren.   On disability from work.   Enjoys being outside, gardening, volunteers at [employer].   Caffeine: diet coke   Social Determinants of Health   Financial Resource Strain:   . Difficulty of Paying Living Expenses:   Food Insecurity:   . Worried About Charity fundraiser in the Last Year:   . Arboriculturist in the Last Year:   Transportation Needs:   . Film/video editor (Medical):   Marland Kitchen Lack of Transportation (Non-Medical):   Physical Activity:   . Days of Exercise per Week:   . Minutes of Exercise per Session:   Stress:   . Feeling of Stress :   Social Connections:   .  Frequency of Communication with Friends and Family:   . Frequency of Social Gatherings with Friends and Family:   . Attends Religious Services:   . Active Member of Clubs or Organizations:   . Attends Archivist Meetings:   Marland Kitchen Marital Status:   Intimate Partner Violence:   . Fear of Current or Ex-Partner:   . Emotionally Abused:   Marland Kitchen Physically Abused:   . Sexually Abused:     Past Surgical History:  Procedure Laterality Date  . HEMORROIDECTOMY    . NASAL SINUS SURGERY     14 surgeries  . OTHER SURGICAL HISTORY     Bladder sling  . SMALL INTESTINE SURGERY      Small bowel obstruction in 2006  . TONSILLECTOMY      Family History  Problem Relation Age of Onset  . Stroke Father        Deceased  . Renal Disease Paternal Grandfather   . Heart failure Paternal Grandfather   . Heart attack Paternal Grandfather   . Kidney disease Maternal Grandfather   . Stroke Paternal Grandmother   . Migraines Neg Hx     Allergies  Allergen Reactions  . Amitriptyline   . Influenza Vaccines Hives  . Morphine Hives and Swelling  . Cefuroxime Axetil Rash    Current Outpatient Medications on File Prior to Visit  Medication Sig Dispense Refill  . albuterol (VENTOLIN HFA) 108 (90 Base) MCG/ACT inhaler INHALE 2 PUFFS INTO THE LUNGS EVERY 4 (FOUR) HOURS AS NEEDED FOR WHEEZING OR SHORTNESS OF BREATH. 8.5 Inhaler 0  . Galcanezumab-gnlm (EMGALITY) 120 MG/ML SOAJ Inject 120 mg into the skin every 30 (thirty) days. 1 pen 11  . nicotine (NICODERM CQ - DOSED IN MG/24 HOURS) 21 mg/24hr patch Place 1 patch (21 mg total) onto the skin daily. 28 patch 0  . tiotropium (SPIRIVA HANDIHALER) 18 MCG inhalation capsule Place 1 capsule (18 mcg total) into inhaler and inhale daily. For COPD 90 capsule 1   No current facility-administered medications on file prior to visit.    BP 122/82   Pulse 83   Ht 5\' 1"  (1.549 m)   Wt 110 lb (49.9 kg)   SpO2 98%   BMI 20.78 kg/m    Objective:   Physical Exam HENT:     Right Ear: Tympanic membrane and ear canal normal.     Left Ear: Tympanic membrane and ear canal normal.  Eyes:     Pupils: Pupils are equal, round, and reactive to light.  Cardiovascular:     Rate and Rhythm: Normal rate and regular rhythm.  Pulmonary:     Effort: Pulmonary effort is normal.     Breath sounds: Normal breath sounds.  Abdominal:     General: Bowel sounds are normal.     Palpations: Abdomen is soft.     Tenderness: There is no abdominal tenderness.  Musculoskeletal:        General: Normal range of motion.     Cervical back: Neck supple.   Skin:    General: Skin is warm and dry.  Neurological:     Mental Status: She is alert and oriented to person, place, and time.     Cranial Nerves: No cranial nerve deficit.     Deep Tendon Reflexes:     Reflex Scores:      Patellar reflexes are 2+ on the right side and 2+ on the left side. Psychiatric:        Mood and Affect: Mood  normal.            Assessment & Plan:

## 2019-12-19 ENCOUNTER — Other Ambulatory Visit (INDEPENDENT_AMBULATORY_CARE_PROVIDER_SITE_OTHER): Payer: Medicare Other

## 2019-12-19 ENCOUNTER — Other Ambulatory Visit: Payer: Self-pay | Admitting: Primary Care

## 2019-12-19 DIAGNOSIS — R739 Hyperglycemia, unspecified: Secondary | ICD-10-CM | POA: Diagnosis not present

## 2019-12-19 DIAGNOSIS — D7589 Other specified diseases of blood and blood-forming organs: Secondary | ICD-10-CM

## 2019-12-19 LAB — COMPREHENSIVE METABOLIC PANEL
ALT: 11 U/L (ref 0–35)
AST: 19 U/L (ref 0–37)
Albumin: 4.6 g/dL (ref 3.5–5.2)
Alkaline Phosphatase: 48 U/L (ref 39–117)
BUN: 20 mg/dL (ref 6–23)
CO2: 27 mEq/L (ref 19–32)
Calcium: 9.7 mg/dL (ref 8.4–10.5)
Chloride: 98 mEq/L (ref 96–112)
Creatinine, Ser: 0.89 mg/dL (ref 0.40–1.20)
GFR: 64.32 mL/min (ref >60.00)
Glucose, Bld: 131 mg/dL — ABNORMAL HIGH (ref 70–99)
Potassium: 4.3 mEq/L (ref 3.5–5.1)
Sodium: 133 mEq/L — ABNORMAL LOW (ref 135–145)
Total Bilirubin: 0.3 mg/dL (ref 0.2–1.2)
Total Protein: 7.1 g/dL (ref 6.0–8.3)

## 2019-12-19 LAB — CBC
HCT: 38 % (ref 36.0–46.0)
Hemoglobin: 13.1 g/dL (ref 12.0–15.0)
MCHC: 34.4 g/dL (ref 30.0–36.0)
MCV: 113.4 fl — ABNORMAL HIGH (ref 78.0–100.0)
Platelets: 117 10*3/uL — ABNORMAL LOW (ref 150.0–400.0)
RBC: 3.35 Mil/uL — ABNORMAL LOW (ref 3.87–5.11)
RDW: 12 % (ref 11.5–15.5)
WBC: 4.7 10*3/uL (ref 4.0–10.5)

## 2019-12-19 LAB — LIPID PANEL
Cholesterol: 182 mg/dL (ref 0–200)
HDL: 104 mg/dL (ref >39.00)
LDL Cholesterol: 67 mg/dL (ref 0–99)
NonHDL: 77.78
Total CHOL/HDL Ratio: 2
Triglycerides: 53 mg/dL (ref 0.0–149.0)
VLDL: 10.6 mg/dL (ref 0.0–40.0)

## 2019-12-19 LAB — HEMOGLOBIN A1C: Hgb A1c MFr Bld: 5.1 % (ref 4.6–6.5)

## 2019-12-19 LAB — VITAMIN B12: Vitamin B-12: 256 pg/mL (ref 211–911)

## 2019-12-19 LAB — HIV ANTIBODY (ROUTINE TESTING W REFLEX): HIV 1&2 Ab, 4th Generation: NONREACTIVE

## 2019-12-19 LAB — FOLATE: Folate: 13.2 ng/mL (ref >5.9)

## 2019-12-20 LAB — PATHOLOGIST SMEAR REVIEW

## 2020-01-01 ENCOUNTER — Other Ambulatory Visit: Payer: Self-pay | Admitting: Primary Care

## 2020-01-01 DIAGNOSIS — J439 Emphysema, unspecified: Secondary | ICD-10-CM

## 2020-01-22 ENCOUNTER — Other Ambulatory Visit: Payer: Self-pay

## 2020-01-22 ENCOUNTER — Ambulatory Visit
Admission: RE | Admit: 2020-01-22 | Discharge: 2020-01-22 | Disposition: A | Discharge status: Home / Self Care | Payer: Medicare Other | Source: Ambulatory Visit | Attending: Primary Care | Admitting: Primary Care

## 2020-01-22 DIAGNOSIS — Z1231 Encounter for screening mammogram for malignant neoplasm of breast: Secondary | ICD-10-CM

## 2020-02-02 ENCOUNTER — Other Ambulatory Visit: Payer: Self-pay | Admitting: Primary Care

## 2020-02-02 DIAGNOSIS — G8929 Other chronic pain: Secondary | ICD-10-CM

## 2020-02-05 NOTE — Telephone Encounter (Signed)
Last prescribed on 12/18/2019 #30 with 0 refill Last OV (cpe ) with [name] on 12/18/2019 No future OV scheduled

## 2020-02-05 NOTE — Telephone Encounter (Signed)
Would recommend patient schedule follow-up visit to discuss additional refills

## 2020-02-09 NOTE — Telephone Encounter (Signed)
Please advise. Dr Einar Pheasant decline the request on 02/02/2020

## 2020-02-15 DIAGNOSIS — J439 Emphysema, unspecified: Secondary | ICD-10-CM

## 2020-02-16 MED ORDER — SPIRIVA HANDIHALER 18 MCG IN CAPS: ORAL_CAPSULE | RESPIRATORY_TRACT | 2 refills | Status: DC

## 2020-04-10 ENCOUNTER — Telehealth: Payer: Self-pay | Admitting: Family Medicine

## 2020-04-10 NOTE — Telephone Encounter (Signed)
Betty Hurst, Is there a way we can contact her neurologist at [facility]? The patient is having massive headaches and can't get through to their office. She receives Botox injections.  I don't want her to rely on Tramadol daily for headache relief.

## 2020-04-10 NOTE — Telephone Encounter (Signed)
Patient called and LVM regarding rescheduling her missed Botox appointment in March. I called the patient back to discuss rescheduling. LVM asking her to call me back.

## 2020-04-16 ENCOUNTER — Telehealth: Payer: Self-pay | Admitting: Primary Care

## 2020-04-16 NOTE — Telephone Encounter (Signed)
Betty Hurst, this patient has contacted me (her PCP) as she has been experiencing increased headaches. I see several messages in Epic where you've attempted to contact her without any luck. Betty Hurst states that she's had no missed calls or voicemail messages. She's attempted to contact your office several times without luck.  She was once on Tramadol 1-2 times daily for headaches (for years) and is requesting Tramadol (from me) for headaches again. I don't feel that Tramadol is best practice taken long term so I asked her to reach out to you guys.  Betty Hurst uses My Chart and responds very quickly. Do you mind reaching out to her via My Chart?

## 2020-04-17 NOTE — Telephone Encounter (Signed)
Yes ma'am. Looks like Dr Jaynee Eagles saw her last year and wanted to try Botox. She had a death in the family and had to cancel but we have not been able to reach her since. I will ask the staff to reach out to her and get her in for follow up. Thank you so much for reaching out!

## 2020-04-17 NOTE — Telephone Encounter (Signed)
Betty Hurst! Please see message below from Alma Friendly, NP, patient's PCP. Can we try to get her in for follow up? Dr Jaynee Eagles last saw her last year and I am not sure she returned. TY!

## 2020-04-17 NOTE — Telephone Encounter (Signed)
I reached out to the pt's once more this afternoon and LVM offering appt with Dr Jaynee Eagles tomorrow 10/28 @ 2:00 pm and asked for patient to call us back ASAP to schedule either for tomorrow or another day with Dr Jaynee Eagles or the NP. I left the office number and hours in the message.

## 2020-04-17 NOTE — Telephone Encounter (Signed)
Noted, thank you!  I also sent her a My Chart message with this information. She responds to My Chart frequently.

## 2020-04-17 NOTE — Telephone Encounter (Signed)
I called the pt back and LVM (ok per DPR) asking for a call back to get her scheduled. When she calls back, please offer her the OV opening with Dr Jaynee Eagles tomorrow that is around 2 pm. If she cannot come, please let me know.

## 2020-04-18 ENCOUNTER — Encounter: Payer: Self-pay | Admitting: *Deleted

## 2020-04-18 ENCOUNTER — Telehealth: Payer: Self-pay | Admitting: *Deleted

## 2020-04-18 NOTE — Telephone Encounter (Signed)
LVM requesting call back.

## 2020-04-18 NOTE — Telephone Encounter (Signed)
Received my chart: Preferred Date Range: 04/29/2020 - 05/03/2020    Preferred Times: Tuesday Afternoon, Wednesday Afternoon, Thursday Afternoon    Reason for visit: Office Visit    Comments:  [facility] office said you had called about an appointment for me tomorrow but no one has called me and left me a message. I need to talk with someone because her office does not know my schedule. My husband is going through lung cancer treatments and I would like to work in an appointment on your time and my time and possibly while I am already in [place]. She says you have left messages but I never received them. My number is [phone] or you can message me through [place].   Called patient and LVM requesting she call back to schedule. Will send my chart as well.

## 2020-04-18 NOTE — Telephone Encounter (Signed)
Thanks, [employer]. I appreciate your work trying to reach this patient! See her most recent message sent 10/27 at 7:45 pm. Not sure if you guys have the right contact on file. I told her to reach out ASAP.

## 2020-04-18 NOTE — Telephone Encounter (Signed)
Pt. Called to speak with nurse in response to my chart message.

## 2020-04-23 NOTE — Telephone Encounter (Signed)
LVM requesting call back to schedule appointment with Dr Jaynee Eagles.

## 2020-04-24 DIAGNOSIS — J439 Emphysema, unspecified: Secondary | ICD-10-CM

## 2020-04-24 NOTE — Telephone Encounter (Signed)
When the patient calls back, please schedule her with Dr Jaynee Eagles instead of sending phone note to nurse as it has been very difficult to get a hold of the patient. Right now we have an availability with Dr Jaynee Eagles next Wed 11/10 @ 3:00 pm. Please message nurse if you do not see it, thank you.

## 2020-04-25 MED ORDER — BUDESONIDE-FORMOTEROL FUMARATE 160-4.5 MCG/ACT IN AERO: 2.0000 | INHALATION_SPRAY | Freq: Two times a day (BID) | RESPIRATORY_TRACT | 3 refills | Status: DC

## 2020-04-25 NOTE — Telephone Encounter (Signed)
I called the patient again just now at [phone] and I LVM (the voicemail greeting stated the number was [phone]) and asked for the patient to call us back to schedule the appointment. I left the office number in the message. I provided the phone hours tomorrow AM from 8 AM to 12 PM and also advised the pt that I also sent a mychart message earlier this morning.  If the patient calls back, Dr Jaynee Eagles has an opening next Wednesday 11/10 @ 3:00 pm. Please schedule her there if she can come or see what else is open and get her scheduled. Thank you!

## 2020-04-29 ENCOUNTER — Encounter: Payer: Self-pay | Admitting: *Deleted

## 2020-04-29 NOTE — Telephone Encounter (Signed)
I called the pt once more at [phone] and LVM advising I was trying to reach pt to schedule and that we are sending a letter to try to contact her. I left the office hours and phone number in the message and asked the pt to give [place] a call back.   The 11/10 appt is no longer available but we do currently have an opening with Dr Jaynee Eagles on 11/24 @ 2:00 pm. When the pt calls back, please offer her that opening.

## 2020-07-22 ENCOUNTER — Ambulatory Visit: Payer: Medicare Other | Admitting: Neurology

## 2020-09-12 ENCOUNTER — Telehealth: Payer: Self-pay | Admitting: Neurology

## 2020-09-12 NOTE — Telephone Encounter (Signed)
Appt with Dr. Jaynee Eagles on 4/14 needs rescheduled, she will be out of the office. I lvm for pt to call back to reschedule, I am cancelling this appt.

## 2020-09-17 ENCOUNTER — Other Ambulatory Visit: Payer: Self-pay

## 2020-09-17 DIAGNOSIS — J439 Emphysema, unspecified: Secondary | ICD-10-CM

## 2020-09-17 MED ORDER — BUDESONIDE-FORMOTEROL FUMARATE 160-4.5 MCG/ACT IN AERO: 2.0000 | INHALATION_SPRAY | Freq: Two times a day (BID) | RESPIRATORY_TRACT | 0 refills | Status: DC

## 2020-10-03 ENCOUNTER — Ambulatory Visit: Payer: Medicare Other | Admitting: Neurology

## 2020-11-24 ENCOUNTER — Other Ambulatory Visit: Payer: Self-pay | Admitting: Primary Care

## 2020-11-24 DIAGNOSIS — J439 Emphysema, unspecified: Secondary | ICD-10-CM

## 2020-12-12 DIAGNOSIS — C4441 Basal cell carcinoma of skin of scalp and neck: Secondary | ICD-10-CM | POA: Diagnosis not present

## 2020-12-12 DIAGNOSIS — L570 Actinic keratosis: Secondary | ICD-10-CM | POA: Diagnosis not present

## 2020-12-12 DIAGNOSIS — C44319 Basal cell carcinoma of skin of other parts of face: Secondary | ICD-10-CM | POA: Diagnosis not present

## 2020-12-20 ENCOUNTER — Other Ambulatory Visit: Payer: Self-pay

## 2020-12-20 ENCOUNTER — Ambulatory Visit (INDEPENDENT_AMBULATORY_CARE_PROVIDER_SITE_OTHER): Payer: Medicare Other | Admitting: Primary Care

## 2020-12-20 VITALS — BP 120/70 | HR 89 | Temp 98.2°F | Resp 16 | Ht 61.0 in | Wt 114.0 lb

## 2020-12-20 DIAGNOSIS — Z1231 Encounter for screening mammogram for malignant neoplasm of breast: Secondary | ICD-10-CM

## 2020-12-20 DIAGNOSIS — G43709 Chronic migraine without aura, not intractable, without status migrainosus: Secondary | ICD-10-CM | POA: Diagnosis not present

## 2020-12-20 DIAGNOSIS — Z0000 Encounter for general adult medical examination without abnormal findings: Secondary | ICD-10-CM

## 2020-12-20 DIAGNOSIS — F419 Anxiety disorder, unspecified: Secondary | ICD-10-CM | POA: Insufficient documentation

## 2020-12-20 DIAGNOSIS — J439 Emphysema, unspecified: Secondary | ICD-10-CM | POA: Diagnosis not present

## 2020-12-20 DIAGNOSIS — Z636 Dependent relative needing care at home: Secondary | ICD-10-CM | POA: Diagnosis not present

## 2020-12-20 NOTE — Assessment & Plan Note (Signed)
Doing well on Symbicort inhaler, rare use of albuterol inhaler. Continue current regimen.

## 2020-12-20 NOTE — Progress Notes (Signed)
Subjective:    Patient ID: Betty Hurst, female    DOB: 1955-10-10, 65 y.o.   MRN: [id number]  HPI  Betty Hurst is a very pleasant 65 y.o. female who presents today for complete physical.  She has noticed some symptoms of depression, anxiety, caregiver stress. Her husband has terminal lung cancer which has been tough. She is not ready for treatment as of now, but will notify when/if ready.  Immunizations: -Tetanus: 2010 -Covid-19: 3 vaccines -Shingles: Never had chicken pox -Pneumonia: 2016   Diet: [place].  Exercise: No regular exercise.  Eye exam: Completes annually  Dental exam: Completes semi-annually   Pap Smear: Hysterectomy Mammogram: Completed in August 2021 Colonoscopy: Completed in 2013, due 2023  Lung Cancer Screening:  Smoker for 10 years. <1/2 PPD  BP Readings from Last 3 Encounters:  12/20/20 120/70  12/18/19 122/82  02/20/19 (!) 156/80       Review of Systems  Constitutional:  Negative for unexpected weight change.  HENT:  Negative for rhinorrhea.   Respiratory:  Negative for cough and shortness of breath.   Cardiovascular:  Negative for chest pain.  Gastrointestinal:  Negative for constipation and diarrhea.  Genitourinary:  Negative for difficulty urinating.  Musculoskeletal:  Negative for arthralgias.  Skin:  Negative for rash.  Allergic/Immunologic: Negative for environmental allergies.  Neurological:  Positive for headaches. Negative for dizziness.  Psychiatric/Behavioral:         Symptoms of anxiety and depression, husband has terminal cancer        Past Medical History:  Diagnosis Date   Acute non-recurrent sinusitis 10/13/2018   Asthma    Frequent sinus infections    Headache    Migraine    Small bowel obstruction ([place])    Surgery in 2006    Social History   Socioeconomic History   Marital status: Married    Spouse name: Not on file   Number of children: 2   Years of education: 2 years college   Highest education  level: Not on file  Occupational History   Not on file  Tobacco Use   Smoking status: Every Day    Packs/day: 0.50    Pack years: 0.00    Types: Cigarettes   Smokeless tobacco: Never   Tobacco comments:    patient is aware that she needs to quit   Substance and Sexual Activity   Alcohol use: Yes    Alcohol/week: 0.0 standard drinks    Comment: occ   Drug use: No   Sexual activity: Yes    Partners: Male  Other Topics Concern   Not on file  Social History Narrative   Married.   Lives in [place] with her husband.   2 daughters, 3 grandchildren.   On disability from work.   Enjoys being outside, gardening, volunteers at [employer].   Caffeine: diet coke   Social Determinants of Radio broadcast assistant Strain: Not on file  Food Insecurity: Not on file  Transportation Needs: Not on file  Physical Activity: Not on file  Stress: Not on file  Social Connections: Not on file  Intimate Partner Violence: Not on file    Past Surgical History:  Procedure Laterality Date   HEMORROIDECTOMY     NASAL SINUS SURGERY     14 surgeries   OTHER SURGICAL HISTORY     Bladder sling   SMALL INTESTINE SURGERY     Small bowel obstruction in 2006   TONSILLECTOMY  Family History  Problem Relation Age of Onset   Stroke Father        Deceased   Renal Disease Paternal Grandfather    Heart failure Paternal Grandfather    Heart attack Paternal Grandfather    Kidney disease Maternal Grandfather    Stroke Paternal Grandmother    Migraines Neg Hx     Allergies  Allergen Reactions   Amitriptyline    Influenza Vaccines Hives   Morphine Hives and Swelling   Cefuroxime Axetil Rash    Current Outpatient Medications on File Prior to Visit  Medication Sig Dispense Refill   albuterol (VENTOLIN HFA) 108 (90 Base) MCG/ACT inhaler INHALE 2 PUFFS INTO THE LUNGS EVERY 4 (FOUR) HOURS AS NEEDED FOR WHEEZING OR SHORTNESS OF BREATH. 8.5 Inhaler 0   Galcanezumab-gnlm  (EMGALITY) 120 MG/ML SOAJ Inject 120 mg into the skin every 30 (thirty) days. 1 pen 11   nicotine (NICODERM CQ - DOSED IN MG/24 HOURS) 21 mg/24hr patch Place 1 patch (21 mg total) onto the skin daily. 28 patch 0   SYMBICORT 160-4.5 MCG/ACT inhaler USE 2 INHALATIONS BY MOUTH  TWICE DAILY 30.6 g 0   traMADol (ULTRAM) 50 MG tablet Take 1/2 to 1 tablet by mouth once daily as needed for migraine. Use sparingly. 30 tablet 0   No current facility-administered medications on file prior to visit.    There were no vitals taken for this visit. Objective:   Physical Exam HENT:     Right Ear: Tympanic membrane and ear canal normal.     Left Ear: Tympanic membrane and ear canal normal.     Nose: Nose normal.  Eyes:     Conjunctiva/sclera: Conjunctivae normal.     Pupils: Pupils are equal, round, and reactive to light.  Neck:     Thyroid: No thyromegaly.  Cardiovascular:     Rate and Rhythm: Normal rate and regular rhythm.     Heart sounds: No murmur heard. Pulmonary:     Effort: Pulmonary effort is normal.     Breath sounds: Normal breath sounds. No rales.  Abdominal:     General: Bowel sounds are normal.     Palpations: Abdomen is soft.     Tenderness: There is no abdominal tenderness.  Musculoskeletal:        General: Normal range of motion.     Cervical back: Neck supple.  Lymphadenopathy:     Cervical: No cervical adenopathy.  Skin:    General: Skin is warm and dry.     Findings: No rash.  Neurological:     Mental Status: She is alert and oriented to person, place, and time.     Cranial Nerves: No cranial nerve deficit.     Deep Tendon Reflexes: Reflexes are normal and symmetric.  Psychiatric:        Mood and Affect: Mood normal.          Assessment & Plan:      This visit occurred during the SARS-CoV-2 public health emergency.  Safety protocols were in place, including screening questions prior to the visit, additional usage of staff PPE, and extensive cleaning of exam  room while observing appropriate contact time as indicated for disinfecting solutions.

## 2020-12-20 NOTE — Assessment & Plan Note (Signed)
Declines Shingrix as she's never had chicken pox. Other vaccines are UTD. Mammogram due next month, orders placed. Colonoscopy seems to be due in 2023.  Exam today stable. Labs pending

## 2020-12-20 NOTE — Assessment & Plan Note (Signed)
Following with neurology, is planning on starting Botox injections. Never started [employer].

## 2020-12-20 NOTE — Patient Instructions (Signed)
Stop by the lab prior to leaving today. I will notify you of your results once received.   [facility] to schedule your mammogram.   It was a pleasure to see you today!  Preventive Care 19-63 Years Old, Female Preventive care refers to lifestyle choices and visits with your health care provider that can promote health and wellness. This includes: A yearly physical exam. This is also called an annual wellness visit. Regular dental and eye exams. Immunizations. Screening for certain conditions. Healthy lifestyle choices, such as: Eating a healthy diet. Getting regular exercise. Not using drugs or products that contain nicotine and tobacco. Limiting alcohol use. What can I expect for my preventive care visit? Physical exam Your health care provider will check your: Height and weight. These may be used to calculate your BMI (body mass index). BMI is a measurement that tells if you are at a healthy weight. Heart rate and blood pressure. Body temperature. Skin for abnormal spots. Counseling Your health care provider may ask you questions about your: Past medical problems. Family's medical history. Alcohol, tobacco, and drug use. Emotional well-being. Home life and relationship well-being. Sexual activity. Diet, exercise, and sleep habits. Work and work Statistician. Access to firearms. Method of birth control. Menstrual cycle. Pregnancy history. What immunizations do I need?  Vaccines are usually given at various ages, according to a schedule. Your health care provider will recommend vaccines for you based on your age, medicalhistory, and lifestyle or other factors, such as travel or where you work. What tests do I need? Blood tests Lipid and cholesterol levels. These may be checked every 5 years, or more often if you are over 82 years old. Hepatitis C test. Hepatitis B test. Screening Lung cancer screening. You may have this screening every year starting at age 79  if you have a 30-pack-year history of smoking and currently smoke or have quit within the past 15 years. Colorectal cancer screening. All adults should have this screening starting at age 48 and continuing until age 71. Your health care provider may recommend screening at age 56 if you are at increased risk. You will have tests every 1-10 years, depending on your results and the type of screening test. Diabetes screening. This is done by checking your blood sugar (glucose) after you have not eaten for a while (fasting). You may have this done every 1-3 years. Mammogram. This may be done every 1-2 years. Talk with your health care provider about when you should start having regular mammograms. This may depend on whether you have a family history of breast cancer. BRCA-related cancer screening. This may be done if you have a family history of breast, ovarian, tubal, or peritoneal cancers. Pelvic exam and Pap test. This may be done every 3 years starting at age 67. Starting at age 74, this may be done every 5 years if you have a Pap test in combination with an HPV test. Other tests STD (sexually transmitted disease) testing, if you are at risk. Bone density scan. This is done to screen for osteoporosis. You may have this scan if you are at high risk for osteoporosis. Talk with your health care provider about your test results, treatment options,and if necessary, the need for more tests. Follow these instructions at home: Eating and drinking  Eat a diet that includes fresh fruits and vegetables, whole grains, lean protein, and low-fat dairy products. Take vitamin and mineral supplements as recommended by your health care provider. Do not drink alcohol  if: Your health care provider tells you not to drink. You are pregnant, may be pregnant, or are planning to become pregnant. If you drink alcohol: Limit how much you have to 0-1 drink a day. Be aware of how much alcohol is in your drink. In the  [place] one drink equals one 12 oz bottle of beer (355 mL), one 5 oz glass of wine (148 mL), or one 1 oz glass of hard liquor (44 mL).  Lifestyle Take daily care of your teeth and gums. Brush your teeth every morning and night with fluoride toothpaste. Floss one time each day. Stay active. Exercise for at least 30 minutes 5 or more days each week. Do not use any products that contain nicotine or tobacco, such as cigarettes, e-cigarettes, and chewing tobacco. If you need help quitting, ask your health care provider. Do not use drugs. If you are sexually active, practice safe sex. Use a condom or other form of protection to prevent STIs (sexually transmitted infections). If you do not wish to become pregnant, use a form of birth control. If you plan to become pregnant, see your health care provider for a prepregnancy visit. If told by your health care provider, take low-dose aspirin daily starting at age 75. Find healthy ways to cope with stress, such as: Meditation, yoga, or listening to music. Journaling. Talking to a trusted person. Spending time with friends and family. Safety Always wear your seat belt while driving or riding in a vehicle. Do not drive: If you have been drinking alcohol. Do not ride with someone who has been drinking. When you are tired or distracted. While texting. Wear a helmet and other protective equipment during sports activities. If you have firearms in your house, make sure you follow all gun safety procedures. What's next? Visit your health care provider once a year for an annual wellness visit. Ask your health care provider how often you should have your eyes and teeth checked. Stay up to date on all vaccines. This information is not intended to replace advice given to you by your health care provider. Make sure you discuss any questions you have with your healthcare provider. Document Revised: 03/12/2020 Document Reviewed: 02/17/2018 Elsevier Patient  Education  2022 [employer].

## 2020-12-20 NOTE — Assessment & Plan Note (Signed)
With husband who has terminal lung cancer. Empathized with patient. She will notify when/if ready for treatment.

## 2020-12-21 ENCOUNTER — Other Ambulatory Visit: Payer: Self-pay | Admitting: Primary Care

## 2020-12-21 DIAGNOSIS — E871 Hypo-osmolality and hyponatremia: Secondary | ICD-10-CM

## 2020-12-21 DIAGNOSIS — D7589 Other specified diseases of blood and blood-forming organs: Secondary | ICD-10-CM

## 2020-12-21 DIAGNOSIS — Z636 Dependent relative needing care at home: Secondary | ICD-10-CM

## 2020-12-21 LAB — COMPREHENSIVE METABOLIC PANEL
AG Ratio: 1.7 (calc) (ref 1.0–2.5)
ALT: 12 U/L (ref 6–29)
AST: 17 U/L (ref 10–35)
Albumin: 4.3 g/dL (ref 3.6–5.1)
Alkaline phosphatase (APISO): 45 U/L (ref 37–153)
BUN: 18 mg/dL (ref 7–25)
CO2: 27 mmol/L (ref 20–32)
Calcium: 9.6 mg/dL (ref 8.6–10.4)
Chloride: 96 mmol/L — ABNORMAL LOW (ref 98–110)
Creat: 0.88 mg/dL (ref 0.50–0.99)
Globulin: 2.5 g/dL (calc) (ref 1.9–3.7)
Glucose, Bld: 96 mg/dL (ref 65–99)
Potassium: 5.2 mmol/L (ref 3.5–5.3)
Sodium: 131 mmol/L — ABNORMAL LOW (ref 135–146)
Total Bilirubin: 0.3 mg/dL (ref 0.2–1.2)
Total Protein: 6.8 g/dL (ref 6.1–8.1)

## 2020-12-21 LAB — CBC
HCT: 36.9 % (ref 35.0–45.0)
Hemoglobin: 13 g/dL (ref 11.7–15.5)
MCH: 39.3 pg — ABNORMAL HIGH (ref 27.0–33.0)
MCHC: 35.2 g/dL (ref 32.0–36.0)
MCV: 111.5 fL — ABNORMAL HIGH (ref 80.0–100.0)
MPV: 10.2 fL (ref 7.5–12.5)
Platelets: 134 10*3/uL — ABNORMAL LOW (ref 140–400)
RBC: 3.31 10*6/uL — ABNORMAL LOW (ref 3.80–5.10)
RDW: 11.8 % (ref 11.0–15.0)
WBC: 3.7 10*3/uL — ABNORMAL LOW (ref 3.8–10.8)

## 2020-12-21 LAB — LIPID PANEL
Cholesterol: 190 mg/dL (ref <200)
HDL: 100 mg/dL (ref >=50)
LDL Cholesterol (Calc): 73 mg/dL (calc)
Non-HDL Cholesterol (Calc): 90 mg/dL (calc) (ref <130)
Total CHOL/HDL Ratio: 1.9 (calc) (ref <5.0)
Triglycerides: 87 mg/dL (ref <150)

## 2020-12-26 ENCOUNTER — Other Ambulatory Visit: Payer: Self-pay

## 2020-12-26 ENCOUNTER — Other Ambulatory Visit (INDEPENDENT_AMBULATORY_CARE_PROVIDER_SITE_OTHER): Payer: Medicare Other

## 2020-12-26 DIAGNOSIS — E871 Hypo-osmolality and hyponatremia: Secondary | ICD-10-CM

## 2020-12-26 DIAGNOSIS — D7589 Other specified diseases of blood and blood-forming organs: Secondary | ICD-10-CM

## 2020-12-26 LAB — FOLATE: Folate: 12.1 ng/mL (ref >5.9)

## 2020-12-26 LAB — VITAMIN B12: Vitamin B-12: 198 pg/mL — ABNORMAL LOW (ref 211–911)

## 2020-12-28 LAB — SODIUM, URINE, RANDOM: Sodium, Ur: 33 mmol/L (ref 28–272)

## 2020-12-28 LAB — OSMOLALITY, URINE: Osmolality, Ur: 389 mOsm/kg (ref 50–1200)

## 2021-01-01 MED ORDER — SERTRALINE HCL 25 MG PO TABS: 25.0000 mg | ORAL_TABLET | Freq: Every day | ORAL | 0 refills | Status: DC

## 2021-01-02 ENCOUNTER — Ambulatory Visit: Payer: Medicare Other | Admitting: Neurology

## 2021-01-02 ENCOUNTER — Encounter: Payer: Self-pay | Admitting: Neurology

## 2021-01-02 VITALS — BP 157/81 | HR 88 | Ht 61.0 in | Wt 116.0 lb

## 2021-01-02 DIAGNOSIS — G43711 Chronic migraine without aura, intractable, with status migrainosus: Secondary | ICD-10-CM | POA: Diagnosis not present

## 2021-01-02 MED ORDER — UBRELVY 100 MG PO TABS: 100.0000 mg | ORAL_TABLET | ORAL | 0 refills | Status: DC | PRN

## 2021-01-02 MED ORDER — QULIPTA 60 MG PO TABS: 60.0000 mg | ORAL_TABLET | Freq: Every day | ORAL | 0 refills | Status: DC

## 2021-01-02 NOTE — Telephone Encounter (Signed)
Megan NP has openings and is able to see patient earlier.  I called the patient and offered options for this morning.  Patient stated they are still waiting on husband's discharge from hospital.  She verbalized appreciation and says she will give [place] a call back if a sooner appointment offered will work for her.  Otherwise we will keep her appointment for 1:30 pm as scheduled with Dr. Jaynee Eagles.

## 2021-01-02 NOTE — Progress Notes (Signed)
[facility]    Provider:  Dr Jaynee Eagles Requesting Provider: [name], NP Primary Care Provider:  [name], NP  CC: Chronic headaches "24 x 7"  The emgality never helped. Tramadol stopped. She has daily headaches, >15 migraine days a month. Sleep study was negative.  Migraines are unilateral, pulsating pounding throbbing, they last 24 to 72 hours, no medication overuse, she feels CGRP and actually had side effect of a large rash contraindicated, no aura, daily headaches and greater than 15 migraine days a month that are moderate to severe, she has tried and failed multiple medications, she has nausea, light and sound sensitivity, hurts to move, ongoing for greater than a year at this quality, severity and frequency.  She did well on Botox in the past.  We will restart Botox.  Failed multiple medications: Zanaflex, Flexeril, tramadol, topiramate, amitriptyline, nortriptyline, depakote, effexor, propranolol, topiramate, emgality, imitre,maxalt (side effects)and many more medications she can't remember.   HPI:  Betty Hurst is a 65 y.o. female here as requested by [name], NP for headache, PHx headache, SBO, bronchitis, long-term current smoker, cough. She has had headaches since frontal sinus surgery in 1996. She saw [facility]. Beatris Si at [facility] for years and tried and failed multiple medications, went to pain management, since 1991. Migraines on the left around the eye, pulsating, pounding, throbbing, severe, light and sound sensitivity, nausea, no vomiting. Headache 24x7. She wakes with headaches, she is tired during the day, she has asthma, quit smoking 20 years ago. She had a sleep eval in the past and was not abnormal. SHe has 15 migraine days a month can last 24 hours, imitrex helps, no medication overuse, noaura ongoing for several years at this frequency and severity.   Failed multiple medications: Zanaflex, Flexeril, tramadol, topiramate, amitriptyline,  nortriptyline, depakote, effexor, propranolol, topiramate, emgality, imitre,maxalt (side effects)and many more medications she can't remember.   Reviewed notes, labs and imaging from outside physicians, which showed:  I reviewed Dr. Ainsley Spinner notes.  Patient has been managed on tramadol for years for headache prevention.  Failed amitriptyline and topiramate, once on Botox injections, trying to get her off tramadol given the addictive nature.  She is failed numerous treatments and she was referred to neurology for evaluation.  She is also a current every day smoker. I reviewed Dr. Georgie Chard notes and he performed botox again but onkl one time.  cmp with low sodium 11/2018, f/u with pcp  Reviewed ct sinuses 1991: postop changes ethmoid and steomeatal region, sinus disease  Review of Systems: Patient complains of symptoms per HPI as well as the following symptoms: neck pain muscular, migraines . Pertinent negatives and positives per HPI. All others negative    Social History   Socioeconomic History   Marital status: Married    Spouse name: Not on file   Number of children: 2   Years of education: 2 years college   Highest education level: Not on file  Occupational History   Not on file  Tobacco Use   Smoking status: Every Day    Packs/day: 0.50    Types: Cigarettes   Smokeless tobacco: Never   Tobacco comments:    Patient has cut back "a lot" since her husband has been sick.  Substance and Sexual Activity   Alcohol use: Yes    Alcohol/week: 0.0 standard drinks    Comment: occ   Drug use: No   Sexual activity: Yes    Partners: Male  Other Topics Concern  Not on file  Social History Narrative   Married.   Lives in [place] with her husband.   2 daughters, 4 grandchildren.   On disability from work.   Enjoys being outside, gardening, volunteers at [employer].   Caffeine: diet coke 16-24 oz per day   Social Determinants of Health   Financial Resource Strain: Not  on file  Food Insecurity: Not on file  Transportation Needs: Not on file  Physical Activity: Not on file  Stress: Not on file  Social Connections: Not on file  Intimate Partner Violence: Not on file    Family History  Problem Relation Age of Onset   Stroke Father        Deceased   Renal Disease Paternal Grandfather    Heart failure Paternal Grandfather    Heart attack Paternal Grandfather    Kidney disease Maternal Grandfather    Stroke Paternal Grandmother    Migraines Neg Hx     Past Medical History:  Diagnosis Date   Acute non-recurrent sinusitis 10/13/2018   Asthma    Frequent sinus infections    Headache    Migraine    Small bowel obstruction ([place])    Surgery in 2006    Patient Active Problem List   Diagnosis Date Noted   Caregiver stress 12/20/2020   Preventative health care 12/09/2015   Pulmonary emphysema ([place]) 08/12/2015   Chronic migraine without aura, with intractable migraine, so stated, with status migrainosus 01/08/2015   Medicare annual wellness visit, subsequent 11/26/2014   Tobacco abuse 11/26/2014   Migraines 09/25/2014   Vaginal cyst 09/25/2014    Past Surgical History:  Procedure Laterality Date   HEMORROIDECTOMY     NASAL SINUS SURGERY     14 surgeries   OTHER SURGICAL HISTORY     Bladder sling   SKIN BIOPSY     basal cell on face   SMALL INTESTINE SURGERY     Small bowel obstruction in 2006   TONSILLECTOMY      Current Outpatient Medications  Medication Sig Dispense Refill   albuterol (VENTOLIN HFA) 108 (90 Base) MCG/ACT inhaler INHALE 2 PUFFS INTO THE LUNGS EVERY 4 (FOUR) HOURS AS NEEDED FOR WHEEZING OR SHORTNESS OF BREATH. 8.5 Inhaler 0   sertraline (ZOLOFT) 25 MG tablet Take 1 tablet (25 mg total) by mouth daily. For anxiety. 90 tablet 0   SYMBICORT 160-4.5 MCG/ACT inhaler USE 2 INHALATIONS BY MOUTH  TWICE DAILY 30.6 g 0   No current facility-administered medications for this visit.    Allergies as of 01/02/2021 - Review  Complete 01/02/2021  Allergen Reaction Noted   Amitriptyline  12/13/2018   Influenza vaccines Hives 11/26/2014   Morphine Hives and Swelling 09/25/2014   Cefuroxime axetil Rash 09/25/2014    Vitals: BP (!) 157/81 (BP Location: Left Arm, Patient Position: Sitting)   Pulse 88   Ht 5\' 1"  (1.549 m)   Wt 116 lb (52.6 kg)   BMI 21.92 kg/m  Last Weight:  Wt Readings from Last 1 Encounters:  01/02/21 116 lb (52.6 kg)   Last Height:   Ht Readings from Last 1 Encounters:  01/02/21 5\' 1"  (1.549 m)    Exam: NAD, pleasant                  Speech:    Speech is normal; fluent and spontaneous with normal comprehension.  Cognition:    The patient is oriented to person, place, and time;     recent and remote memory  intact;     language fluent;    Cranial Nerves:    The pupils are equal, round, and reactive to light.Trigeminal sensation is intact and the muscles of mastication are normal. The face is symmetric. The palate elevates in the midline. Hearing intact. Voice is normal. Shoulder shrug is normal. The tongue has normal motion without fasciculations.   Coordination:  No dysmetria  Motor Observation:    No asymmetry, no atrophy, and no involuntary movements noted. Tone:    Normal muscle tone.     Strength:    Strength is V/V in the upper and lower limbs.      Sensation: intact to LT    Assessment/Plan:  65 year old with chronic intractable headaches. Discussed options. Did well on botox in the past, restart. She is NOT on any long-term CGRP medications. Has failed multiple medications over the years.  Failed multiple medications: Zanaflex, Flexeril, tramadol, topiramate, amitriptyline, nortriptyline, depakote, effexor, propranolol, topiramate, emgality(side effects), imitre,maxalt (side effects)and many more medications she can't remember.     Discussed: To prevent or relieve headaches, try the following: Cool Compress. Lie down and place a cool compress on your head.   Avoid headache triggers. If certain foods or odors seem to have triggered your migraines in the past, avoid them. A headache diary might help you identify triggers.  Include physical activity in your daily routine. Try a daily walk or other moderate aerobic exercise.  Manage stress. Find healthy ways to cope with the stressors, such as delegating tasks on your to-do list.  Practice relaxation techniques. Try deep breathing, yoga, massage and visualization.  Eat regularly. Eating regularly scheduled meals and maintaining a healthy diet might help prevent headaches. Also, drink plenty of fluids.  Follow a regular sleep schedule. Sleep deprivation might contribute to headaches Consider biofeedback. With this mind-body technique, you learn to control certain bodily functions -- such as muscle tension, heart rate and blood pressure -- to prevent headaches or reduce headache pain.    Proceed to emergency room if you experience new or worsening symptoms or symptoms do not resolve, if you have new neurologic symptoms or if headache is severe, or for any concerning symptom.   Provided education and documentation from American headache Society toolbox including articles on: chronic migraine medication overuse headache, chronic migraines, prevention of migraines, behavioral and other nonpharmacologic treatments for headache.  Cc: [name], NP,    Sarina Ill, MD  [facility] [address] [place], [place] [address]  Phone [phone] Fax [phone]  I spent 30 minutes of face-to-face and non-face-to-face time with patient on the  1. Chronic migraine without aura, with intractable migraine, so stated, with status migrainosus    diagnosis.  This included previsit chart review, lab review, study review, order entry, electronic health record documentation, patient education on the different diagnostic and therapeutic options, counseling and coordination of  care, risks and benefits of management, compliance, or risk factor reduction

## 2021-01-02 NOTE — Patient Instructions (Addendum)
Start Botox  Ubrogepant tablets (FYI for future) What is this medication? UBROGEPANT (ue BROE je pant) is used to treat migraine headaches with or without aura. An aura is a strange feeling or visual disturbance that warns youof an attack. It is not used to prevent migraines. This medicine may be used for other purposes; ask your health care provider orpharmacist if you have questions. COMMON BRAND NAME(S): Roselyn Meier What should I tell my care team before I take this medication? They need to know if you have any of these conditions: kidney disease liver disease an unusual or allergic reaction to ubrogepant, other medicines, foods, dyes, or preservatives pregnant or trying to get pregnant breast-feeding How should I use this medication? Take this medicine by mouth with a glass of water. Follow the directions on the prescription label. You can take it with or without food. If it upsets your stomach, take it with food. Take your medicine at regular intervals. Do not take it more often than directed. Do not stop taking except on your doctor'sadvice. Talk to your pediatrician about the use of this medicine in children. Specialcare may be needed. Overdosage: If you think you have taken too much of this medicine contact apoison control center or emergency room at once. NOTE: This medicine is only for you. Do not share this medicine with others. What if I miss a dose? This does not apply. This medicine is not for regular use. What may interact with this medication? Do not take this medicine with any of the following medicines: ceritinib certain antibiotics like chloramphenicol, clarithromycin, telithromycin certain antivirals for HIV like atazanavir, cobicistat, darunavir, delavirdine, fosamprenavir, indinavir, ritonavir certain medicines for fungal infections like itraconazole, ketoconazole, posaconazole, voriconazole conivaptan grapefruit idelalisib mifepristone nefazodone ribociclib This  medicine may also interact with the following medications: carvedilol certain medicines for seizures like phenobarbital, phenytoin ciprofloxacin cyclosporine eltrombopag fluconazole fluvoxamine quinidine rifampin St. John's wort verapamil This list may not describe all possible interactions. Give your health care provider a list of all the medicines, herbs, non-prescription drugs, or dietary supplements you use. Also tell them if you smoke, drink alcohol, or use illegaldrugs. Some items may interact with your medicine. What should I watch for while using this medication? Visit your health care professional for regular checks on your progress. Tell your health care professional if your symptoms do not start to get better or ifthey get worse. Your mouth may get dry. Chewing sugarless gum or sucking hard candy and drinking plenty of water may help. Contact your health care professional if theproblem does not go away or is severe. What side effects may I notice from receiving this medication? Side effects that you should report to your doctor or health care professionalas soon as possible: allergic reactions like skin rash, itching or hives; swelling of the face, lips, or tongue Side effects that usually do not require medical attention (report these toyour doctor or health care professional if they continue or are bothersome): drowsiness dry mouth nausea tiredness This list may not describe all possible side effects. Call your doctor for medical advice about side effects. You may report side effects to FDA at1-800-FDA-1088. Where should I keep my medication? Keep out of the reach of children. Store at room temperature between 15 and 30 degrees C (59 and 86 degrees F). Throw away any unused medicine after theexpiration date. NOTE: This sheet is a summary. It may not cover all possible information. If you have questions about this medicine, talk to your doctor, pharmacist,  orhealth care  provider.  2022 Elsevier/Gold Standard (2018-08-25 08:50:55)   Atogepant tablets What is this medication? ATOGEPANT (a TOE je pant) is used to prevent migraine headaches. This medicine may be used for other purposes; ask your health care provider orpharmacist if you have questions. COMMON BRAND NAME(S): QULIPTA What should I tell my care team before I take this medication? They need to know if you have any of these conditions: kidney disease liver disease an unusual or allergic reaction to atogepant, other medicines, foods, dyes, or preservatives pregnant or trying to get pregnant breast-feeding How should I use this medication? Take this medicine by mouth with water. Take it as directed on the prescription label at the same time every day. You can take it with or without food. If it upsets your stomach, take it with food. Keep taking it unless your health careprovider tells you to stop. Talk to your health care provider about the use of this medicine in children.Special care may be needed. Overdosage: If you think you have taken too much of this medicine contact apoison control center or emergency room at once. NOTE: This medicine is only for you. Do not share this medicine with others. What if I miss a dose? If you miss a dose, take it as soon as you can. If it is almost time for yournext dose, take only that dose. Do not take double or extra doses. What may interact with this medication? carbamazepine certain medicines for fungal infections like itraconazole, ketoconazole clarithromycin cyclosporine efavirenz etravirine phenytoin rifampin [facility]. John's Wort This list may not describe all possible interactions. Give your health care provider a list of all the medicines, herbs, non-prescription drugs, or dietary supplements you use. Also tell them if you smoke, drink alcohol, or use illegaldrugs. Some items may interact with your medicine. What should I watch for while using this  medication? Visit your health care provider for regular checks on your progress. Tell your health care provider if your symptoms do not start to get better or if they getworse. What side effects may I notice from receiving this medication? Side effects that you should report to your doctor or health care provider assoon as possible: allergic reactions (skin rash, itching or hives; swelling of the face, lips, tongue) light-colored stool liver injury (dark yellow or brown urine; general ill feeling or flu-like symptoms; loss of appetite, right upper belly pain; unusually weak or tired, yellowing of the eyes or skin) Side effects that usually do not require medical attention (report these toyour doctor or health care provider if they continue or are bothersome): constipation lack or loss of appetite nausea unusually weak or tired weight loss This list may not describe all possible side effects. Call your doctor for medical advice about side effects. You may report side effects to FDA at1-800-FDA-1088. Where should I keep my medication? Keep out of the reach of children and pets. Store at room temperature between 20 and 25 degrees C (68 and 77 degrees F).Get rid of any unused medicine after the expiration date. To get rid of medicines that are no longer needed or have expired: Take the medicine to a medicine take-back program. Check with your pharmacy or law enforcement to find a location. If you cannot return the medicine, check the label or package insert to see if the medicine should be thrown out in the garbage or flushed down the toilet. If you are not sure, ask your health care provider. If it is safe to put  it in the trash, take the medicine out of the container. Mix the medicine with cat litter, dirt, coffee grounds, or other unwanted substance. Seal the mixture in a bag or container. Put it in the trash. NOTE: This sheet is a summary. It may not cover all possible information. If you have  questions about this medicine, talk to your doctor, pharmacist, orhealth care provider.  2022 Elsevier/Gold Standard (2020-03-21 11:20:03)

## 2021-01-05 ENCOUNTER — Encounter: Payer: Self-pay | Admitting: Neurology

## 2021-01-06 ENCOUNTER — Telehealth: Payer: Self-pay | Admitting: Neurology

## 2021-01-06 NOTE — Telephone Encounter (Signed)
-----   Message from Melvenia Beam, MD sent at 01/05/2021 12:43 PM EDT ----- Regarding: Botox start Please initiate botox protocol for migraines. G43.711. thank you. She can go to [facility] (in [place] if that is sooner or she has nothing else) or [facility].

## 2021-01-07 NOTE — Telephone Encounter (Signed)
-----   Message from Melvenia Beam, MD sent at 01/05/2021 12:43 PM EDT ----- Regarding: Botox start Please initiate botox protocol for migraines. G43.711. thank you. She can go to [facility] (in [place] if that is sooner or she has nothing else) or [facility].

## 2021-01-28 DIAGNOSIS — C44319 Basal cell carcinoma of skin of other parts of face: Secondary | ICD-10-CM | POA: Diagnosis not present

## 2021-02-06 DIAGNOSIS — C44319 Basal cell carcinoma of skin of other parts of face: Secondary | ICD-10-CM | POA: Diagnosis not present

## 2021-02-13 ENCOUNTER — Other Ambulatory Visit: Payer: Self-pay

## 2021-02-13 ENCOUNTER — Ambulatory Visit
Admission: RE | Admit: 2021-02-13 | Discharge: 2021-02-13 | Disposition: A | Discharge status: Home / Self Care | Payer: Medicare Other | Source: Ambulatory Visit | Attending: Primary Care | Admitting: Primary Care

## 2021-02-13 DIAGNOSIS — Z1231 Encounter for screening mammogram for malignant neoplasm of breast: Secondary | ICD-10-CM

## 2021-02-15 ENCOUNTER — Other Ambulatory Visit: Payer: Self-pay | Admitting: Primary Care

## 2021-02-15 DIAGNOSIS — E538 Deficiency of other specified B group vitamins: Secondary | ICD-10-CM

## 2021-02-28 ENCOUNTER — Other Ambulatory Visit: Payer: Medicare Other

## 2021-03-07 DIAGNOSIS — H5203 Hypermetropia, bilateral: Secondary | ICD-10-CM | POA: Diagnosis not present

## 2021-03-07 DIAGNOSIS — H2513 Age-related nuclear cataract, bilateral: Secondary | ICD-10-CM | POA: Diagnosis not present

## 2021-03-17 NOTE — Telephone Encounter (Signed)
I did not receive a charge sheet or signed consent for this patient. I will make an appointment for her but I need charge sheet

## 2021-03-30 ENCOUNTER — Other Ambulatory Visit: Payer: Self-pay | Admitting: Primary Care

## 2021-03-30 DIAGNOSIS — Z636 Dependent relative needing care at home: Secondary | ICD-10-CM

## 2021-06-02 ENCOUNTER — Other Ambulatory Visit: Payer: Self-pay | Admitting: Primary Care

## 2021-06-02 DIAGNOSIS — J439 Emphysema, unspecified: Secondary | ICD-10-CM

## 2021-06-25 DIAGNOSIS — D692 Other nonthrombocytopenic purpura: Secondary | ICD-10-CM | POA: Diagnosis not present

## 2021-06-25 DIAGNOSIS — C44519 Basal cell carcinoma of skin of other part of trunk: Secondary | ICD-10-CM | POA: Diagnosis not present

## 2021-06-25 DIAGNOSIS — Z85828 Personal history of other malignant neoplasm of skin: Secondary | ICD-10-CM | POA: Diagnosis not present

## 2021-06-25 DIAGNOSIS — L570 Actinic keratosis: Secondary | ICD-10-CM | POA: Diagnosis not present

## 2021-09-12 ENCOUNTER — Telehealth: Payer: Self-pay | Admitting: *Deleted

## 2021-09-12 NOTE — Telephone Encounter (Signed)
Spoke with the patient and scheduled a new patient appt with Dr Berline Lopes on 4/3 at 10:30 am. Patient requested to be put on a cancellation list. Patient aware of the address and phone number for the cancer center  ?

## 2021-09-22 ENCOUNTER — Inpatient Hospital Stay: Payer: Medicare Other | Attending: Gynecologic Oncology | Admitting: Gynecologic Oncology

## 2021-09-22 ENCOUNTER — Encounter: Payer: Self-pay | Admitting: Gynecologic Oncology

## 2021-09-22 ENCOUNTER — Other Ambulatory Visit (HOSPITAL_COMMUNITY)
Admission: RE | Admit: 2021-09-22 | Discharge: 2021-09-22 | Disposition: A | Discharge status: Home / Self Care | Payer: Medicare Other | Source: Ambulatory Visit | Attending: Gynecologic Oncology | Admitting: Gynecologic Oncology

## 2021-09-22 ENCOUNTER — Other Ambulatory Visit: Payer: Self-pay

## 2021-09-22 ENCOUNTER — Ambulatory Visit (HOSPITAL_BASED_OUTPATIENT_CLINIC_OR_DEPARTMENT_OTHER): Payer: Medicare Other | Admitting: Gynecologic Oncology

## 2021-09-22 VITALS — BP 136/63 | HR 81 | Temp 98.0°F | Resp 16 | Ht 60.0 in | Wt 111.2 lb

## 2021-09-22 DIAGNOSIS — C519 Malignant neoplasm of vulva, unspecified: Secondary | ICD-10-CM

## 2021-09-22 DIAGNOSIS — Z124 Encounter for screening for malignant neoplasm of cervix: Secondary | ICD-10-CM | POA: Diagnosis not present

## 2021-09-22 DIAGNOSIS — Z85828 Personal history of other malignant neoplasm of skin: Secondary | ICD-10-CM | POA: Diagnosis not present

## 2021-09-22 DIAGNOSIS — J45909 Unspecified asthma, uncomplicated: Secondary | ICD-10-CM | POA: Insufficient documentation

## 2021-09-22 DIAGNOSIS — Z01419 Encounter for gynecological examination (general) (routine) without abnormal findings: Secondary | ICD-10-CM | POA: Insufficient documentation

## 2021-09-22 DIAGNOSIS — N9089 Other specified noninflammatory disorders of vulva and perineum: Secondary | ICD-10-CM

## 2021-09-22 DIAGNOSIS — A630 Anogenital (venereal) warts: Secondary | ICD-10-CM | POA: Insufficient documentation

## 2021-09-22 DIAGNOSIS — G893 Neoplasm related pain (acute) (chronic): Secondary | ICD-10-CM | POA: Diagnosis not present

## 2021-09-22 DIAGNOSIS — N898 Other specified noninflammatory disorders of vagina: Secondary | ICD-10-CM | POA: Insufficient documentation

## 2021-09-22 DIAGNOSIS — N950 Postmenopausal bleeding: Secondary | ICD-10-CM | POA: Insufficient documentation

## 2021-09-22 DIAGNOSIS — Z1151 Encounter for screening for human papillomavirus (HPV): Secondary | ICD-10-CM | POA: Insufficient documentation

## 2021-09-22 DIAGNOSIS — Z0000 Encounter for general adult medical examination without abnormal findings: Secondary | ICD-10-CM

## 2021-09-22 NOTE — Progress Notes (Signed)
GYNECOLOGIC ONCOLOGY NEW PATIENT CONSULTATION  ? ?Patient Name: Betty Hurst  ?Patient Age: 66 y.o. ?Date of Service: 09/22/2021 ?Referring Provider: Eyvonne Mechanic, MD ? ?Primary Care Provider: [name], NP ?Consulting Provider: Jeral Pinch, MD  ? 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 ? 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  Based on her exam today, she has a 5 cm unilateral tumor that does not cross the midline but comes within less than 2 cm of it.  The tumor itself is not fixed and is amenable to surgical excision.  Given distance from urethra and anus, I think a good surgical margin can be achieved without disruption of these adjacent organs.  I also think that the clitoris can very likely be spared.  Given the size of the lesion, I recommended that we plan to proceed with bilateral inguinal lymphadenectomy.  PET scan was ordered today to assess lymph node status as well as for evidence of metastatic disease. ? ?Given HPV related vulvar cancer, Pap test was performed today.  This was likely from the vaginal cuff although was a little challenging to discern today if the patient has a small amount of residual cervix or prominent apex of her vaginal cuff. ? 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  She voiced significant concerns about the recovery.  After surgery.  She voiced needing to wait initially until after early-mid May, so that her sister-in-law can be present to help (her sister-in-law's daughter is getting married on May 12).  She then voiced preferring to wait until the summer.  I am concerned that given the unknowns related to her husband's health, we may begin a pattern of waiting and not proceeding with treatment.  While it is not the standard of care for a tumor that is amenable to  resection, if surgical excision is not a valid treatment option at this time for the patient given her life circumstances, we could consider primary radiation.  I would still recommend that even if we proceed with primary radiation of her vulvar lesion, that we perform a surgical lymph node assessment.  The patient, though, seems most concerned about this portion of the procedure including having drains in place postoperatively. ? ?Recommended that we get HPV testing. ? ?The patient would like some time to think about treatment options.  We will discuss again after her PET scan later this month.  She was tentatively scheduled for preoperative appointment on 5/16. ? ?We discussed that plan would be for partial radical vulvectomy with bilateral inguinofemoral lymphadenectomy, and any other indicated procedures.  We discussed that risks of surgery include but are not limited to bleeding, need for blood transfusion, infection, wound dehiscence, injury to surrounding structures requiring repair, anesthesia complications, VTE, and rarely death.  We also discussed short and long-term complications related to lymph node dissection included development of lymphocele or lymphocyst as well as lower extremity lymphedema.  Patient's questions were answered about the surgery.  She will return in may for peri-operative teaching. ? ?A copy of this note was sent to the patient's referring provider.  ? ?65 minutes of total time was spent for this patient encounter, including preparation, face-to-face counseling with the patient and coordination of care, and documentation of the encounter. ? ? ?Jeral Pinch, MD  ?Division of Gynecologic Oncology  ?Department of  Obstetrics and Gynecology  ?[facility]  ?___________________________________________  ?Chief Complaint: ?Chief Complaint  ?Patient presents with  ? Vulvar mass  ? ? ?History of Present Illness:  ?Betty Hurst is a 66 y.o. y.o. female who is  seen in consultation at the request of Dr. Mardelle Matte for an evaluation of vulvar cancer. ? ?The patient recently presented with a painful left vulvar lesion and associated discharge and bleeding.  This has increased in symptoms over the last 6 months.  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  Per her report, she was followed after and had laser treatment of what was likely precancer again.  Prior notes show that she was taken to the operating room on 07/04/2013 in the setting of VIN 3 for CO2 laser ablation.  She was then lost to follow-up after 2016. ? ?She notes that about 6 months ago, she began to appreciate an area on her vulva that she thought initially was scar tissue.  Over the last 6 months, it has increased in size.  In January, she began to notice discharge as well as intermittent small amount of bleeding.  She also began to feel like there was a tear or ulcer at the top of the lesion which caused her pain.  She wears a panty liner or pad now all the time because of her discharge, which has caused some irritation to her vulva as well as groin area.  She waited to seek care given her husband's terminal cancer diagnosis, stating that she has put her own health needs on the back burner for the last couple of years. ? 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  Squamous epithelium is strong diffuse p16 positivity on IHC. ? ?She endorses a good appetite without nausea or emesis.  She has lost about 3 pounds over the last few months and is not trying to lose weight.  After surgery for a partial small bowel obstruction in 2006, she has chronic intermittent diarrhea.  She denies any change to bowel function recently.  She has a little bit of burning on her vulva when she urinates, otherwise denies any urinary symptoms. ? ?Patient underwent hysterectomy several decades  ago. ? ?PAST MEDICAL HISTORY:  ?Past Medical History:  ?Diagnosis Date  ? Acute non-recurrent sinusitis 10/13/2018  ? Asthma   ? simbicort, no rescue inhaler use  ? Basal cell carcinoma of skin   ? on forehead and temple, and left side of nose  ? Frequent sinus infections   ? Headache   ? Migraine   ? Small bowel obstruction ([place])   ? Surgery in 2006  ?  ? ?PAST SURGICAL HISTORY:  ?Past Surgical History:  ?Procedure Laterality Date  ? ABDOMINAL HYSTERECTOMY  1989  ? thinks had BSO  ? HEMORROIDECTOMY    ? NASAL SINUS SURGERY    ? 14 surgeries  ? OTHER SURGICAL HISTORY    ? Bladder sling  ? SKIN BIOPSY    ? basal cell on face  ? SMALL INTESTINE SURGERY    ? Small bowel obstruction in 2006  ? TONSILLECTOMY    ? vulvectomy    ? ? ?OB/GYN HISTORY:  ?OB History  ?Gravida Para Term Preterm AB Living  ?2 2          ?SAB IAB Ectopic Multiple Live Births  ?           ?  ?#  Outcome Date GA Lbr Len/2nd Weight Sex Delivery Anes PTL Lv  ?2 Para           ?1 Para           ? ? ?No LMP recorded. Patient has had a hysterectomy. ? ?Age at menarche: 73 ?Age at menopause: 75 ?Hx of HRT: Denies ?Hx of STDs: Denies ?Last pap: Thinks 2015, this is not in our records ?History of abnormal pap smears: Denies ? ?SCREENING STUDIES:  ?Last mammogram: 01/2021  ?Last colonoscopy: 2013 ? ?MEDICATIONS: ?Outpatient Encounter Medications as of 09/22/2021  ?Medication Sig  ? budesonide-formoterol (SYMBICORT) 160-4.5 MCG/ACT inhaler INHALE 2 INHALATIONS BY  MOUTH TWICE DAILY  ? fluorouracil (EFUDEX) 5 % cream Apply topically 2 (two) times daily. As needed  ? albuterol (VENTOLIN HFA) 108 (90 Base) MCG/ACT inhaler INHALE 2 PUFFS INTO THE LUNGS EVERY 4 (FOUR) HOURS AS NEEDED FOR WHEEZING OR SHORTNESS OF BREATH. (Patient not taking: Reported on 09/22/2021)  ? [DISCONTINUED] sertraline (ZOLOFT) 25 MG tablet TAKE 1 TABLET (25 MG TOTAL) BY MOUTH DAILY. FOR ANXIETY.  ? ?No facility-administered encounter medications on file as of 09/22/2021.  ? ? ?ALLERGIES:   ?Allergies  ?Allergen Reactions  ? Amitriptyline   ? Influenza Vaccines Hives  ? Morphine Hives and Swelling  ? Cefuroxime Axetil Rash  ?  ? ?FAMILY HISTORY:  ?Family History  ?Problem Relation Age of Onset  ? Stroke Father

## 2021-09-22 NOTE — Patient Instructions (Signed)
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 Berline Lopes will contact you with the results.  ? ?Preparing for your Surgery ? ?Plan for surgery on Nov 11, 2021 with Dr. Jeral Pinch at [place] will be scheduled for a radical vulvectomy (removing the mass and surrounding tissue on the vulva), bilateral inguinal lymph node dissection (removing the lymph node in both groins to test for cancer spread).  ? ?You can expect to have an overnight stay in the hospital. You will be discharged home with two drains in place (left, right groin). These will need to be emptied on a regular basis during the day and output recorded. ? ?Pre-operative Testing ?-You will receive a phone call from presurgical testing at [facility] to arrange for a pre-operative appointment and lab work. ? ?-Bring your insurance card, copy of an advanced directive if applicable, medication list ? ?-At that visit, you will be asked to sign a consent for a possible blood transfusion in case a transfusion becomes necessary during surgery.  The need for a blood transfusion is rare but having consent is a necessary part of your care.    ? ?-You should not be taking blood thinners or aspirin at least ten days prior to surgery unless instructed by your surgeon. ? ?-Do not take supplements such as fish oil (omega 3), red yeast rice, turmeric before your surgery. You want to avoid medications with aspirin in them including headache powders such as BC or Goody's), Excedrin migraine. ? ?Day Before Surgery at Home ?-You will be advised you can have clear liquids up until 3 hours before your surgery.   ? ?Your role in recovery ?Your role is to become active as soon as directed by your doctor, while still giving yourself time to heal.  Rest when you feel tired. You will be asked to do the following in order to speed your recovery: ? ?- Cough and breathe deeply. This helps to clear and expand your lungs and can prevent  pneumonia after surgery.  ?- STAY ACTIVE WHEN YOU GET HOME. Do mild physical activity. Walking or moving your legs help your circulation and body functions return to normal. Do not try to get up or walk alone the first time after surgery.   ?-If you develop swelling on one leg or the other, pain in the back of your leg, redness/warmth in one of your legs, please call the office or go to the Emergency Room to have a doppler to rule out a blood clot. For shortness of breath, chest pain-seek care in the Emergency Room as soon as possible. ?- Actively manage your pain. Managing your pain lets you move in comfort. We will ask you to rate your pain on a scale of zero to 10. It is your responsibility to tell your doctor or nurse where and how much you hurt so your pain can be treated. ? ?Special Considerations ?-If you are diabetic, you may be placed on insulin after surgery to have closer control over your blood sugars to promote healing and recovery.  This does not mean that you will be discharged on insulin.  If applicable, your oral antidiabetics will be resumed when you are tolerating a solid diet. ? ?-Your final pathology results from surgery should be available around one week after surgery and the results will be relayed to you when available. ? ?-Dr. Lahoma Crocker is the surgeon that assists your GYN Oncologist with surgery.  If you end up staying the night, the next day after your surgery you will either see Dr. Berline Lopes or Dr. Lahoma Crocker. ? ?-FMLA forms can be faxed to [phone] and please allow 5-7 business days for completion. ? ?Pain Management After Surgery ?-You will be prescribed your pain medication and bowel regimen medications before surgery and closer to the surgery date so that you can have these available when you are discharged from the hospital. The pain medication is for use ONLY AFTER surgery and a new prescription will not be given.  ? ?-Make sure that you have Tylenol and  Ibuprofen at home to use on a regular basis after surgery for pain control. We recommend alternating the medications every hour to six hours since they work differently and are processed in the body differently for pain relief. ? ?-Review the attached handout on narcotic use and their risks and side effects.  ? ?Bowel Regimen ?-You will be prescribed Sennakot-S to take nightly to prevent constipation especially if you are taking the narcotic pain medication intermittently.  It is important to prevent constipation and drink adequate amounts of liquids. You can stop taking this medication when you are not taking pain medication and you are back on your normal bowel routine. ? ?Risks of Surgery ?Risks of surgery are low but include bleeding, infection, damage to surrounding structures, re-operation, blood clots, and very rarely death. ? ? ?Blood Transfusion Information (For the consent to be signed before surgery) ? ?We will be checking your blood type before surgery so in case of emergencies, we will know what type of blood you would need. ? ?                                          WHAT IS A BLOOD TRANSFUSION? ? ?A transfusion is the replacement of blood or some of its parts. Blood is made up of multiple cells which provide different functions. ?Red blood cells carry oxygen and are used for blood loss replacement. ?White blood cells fight against infection. ?Platelets control bleeding. ?Plasma helps clot blood. ?Other blood products are available for specialized needs, such as hemophilia or other clotting disorders. ?BEFORE THE TRANSFUSION  ?Who gives blood for transfusions?  ?You may be able to donate blood to be used at a later date on yourself (autologous donation). ?Relatives can be asked to donate blood. This is generally not any safer than if you have received blood from a stranger. The same precautions are taken to ensure safety when a relative's blood is donated. ?Healthy volunteers who are fully evaluated  to make sure their blood is safe. This is blood bank blood. ?Transfusion therapy is the safest it has ever been in the practice of medicine. Before blood is taken from a donor, a complete history is taken to make sure that person has no history of diseases nor engages in risky social behavior (examples are intravenous drug use or sexual activity with multiple partners). The donor's travel history is screened to minimize risk of transmitting infections, such as malaria. The donated blood is tested for signs of infectious diseases, such as HIV and hepatitis. The blood is then tested to be sure it is compatible with you in order to minimize the chance of a transfusion reaction. If you or a relative donates blood, this is often done in anticipation of surgery and is not appropriate for emergency  situations. It takes many days to process the donated blood. ?RISKS AND COMPLICATIONS ?Although transfusion therapy is very safe and saves many lives, the main dangers of transfusion include:  ?Getting an infectious disease. ?Developing a transfusion reaction. This is an allergic reaction to something in the blood you were given. Every precaution is taken to prevent this. ?The decision to have a blood transfusion has been considered carefully by your caregiver before blood is given. Blood is not given unless the benefits outweigh the risks. ? ?AFTER SURGERY INSTRUCTIONS ? ?We recommend purchasing several bags of frozen green peas and dividing them into ziploc bags. You will want to keep these in the freezer and have them ready to use as ice packs to the vulvar incision. Once the ice pack is no longer cold, you can get another from the freezer. The frozen peas mold to your body better than a regular ice pack.  ? ?You will be discharged home with two drains in place (left, right groin). These will need to be emptied on a regular basis during the day and output recorded. ? ?Return to work: 4-6 weeks if applicable ? ?Activity: ?1.  Be up and out of the bed during the day.  Take a nap if needed.  You may walk up steps but be careful and use the hand rail.  Stair climbing will tire you more than you think, you may need to stop part way and res

## 2021-09-22 NOTE — Progress Notes (Signed)
Patient here for new patient consultation with Dr. Jeral Pinch and for a pre-operative discussion prior to her scheduled surgery on Nov 11, 2021. She is scheduled for radical vulvectomy, bilateral inguinal lymph node dissection. The patient elected to review instructions today but does state she will have a hard time remembering all the information so a telephone visit was made one week before surgery to re-review. 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 The surgery was discussed in detail.  See after visit summary for additional details. Visual aids used to discuss items related to surgery.   ?  ?Discussed post-op pain management in detail including the aspects of the enhanced recovery pathway.  Advised her that a new prescription would be sent in closer to the surgery date and it is only to be used for after her upcoming surgery.  We discussed the use of tylenol post-op and to monitor for a maximum of 4,000 mg in a 24 hour period.  Discussed bowel regimen in detail and patient states she does not have issues with constipation after having bowel surgery.   ?  ?Discussed the use of SCDs and measures to take at home to prevent DVT including frequent mobility.  Reportable signs and symptoms of DVT discussed. Post-operative instructions discussed and expectations for after surgery. Incisional care discussed as well including reportable signs and symptoms including erythema, drainage, wound separation.  ?   ?5 minutes spent with the patient.  Verbalizing understanding of material discussed. No needs or concerns voiced at the end of the visit.   Advised patient to call for any needs. Reviewed PET scan instructions but patient feels this will most likely have to be adjusted. ? ?This appointment is included in the global surgical bundle as pre-operative teaching and has no charge.     ?

## 2021-09-22 NOTE — Patient Instructions (Signed)
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 Berline Hurst will contact you with the results.  ?  ?Preparing for your Surgery ?  ?Plan for surgery on Nov 11, 2021 with Dr. Jeral Hurst at [place] will be scheduled for a radical vulvectomy (removing the mass and surrounding tissue on the vulva), bilateral inguinal lymph node dissection (removing the lymph node in both groins to test for cancer spread).  ?  ?You can expect to have an overnight stay in the hospital. You will be discharged home with two drains in place (left, right groin). These will need to be emptied on a regular basis during the day and output recorded. ?  ?Pre-operative Testing ?-You will receive a phone call from presurgical testing at [facility] to arrange for a pre-operative appointment and lab work. ?  ?-Bring your insurance card, copy of an advanced directive if applicable, medication list ?  ?-At that visit, you will be asked to sign a consent for a possible blood transfusion in case a transfusion becomes necessary during surgery.  The need for a blood transfusion is rare but having consent is a necessary part of your care.    ?  ?-You should not be taking blood thinners or aspirin at least ten days prior to surgery unless instructed by your surgeon. ?  ?-Do not take supplements such as fish oil (omega 3), red yeast rice, turmeric before your surgery. You want to avoid medications with aspirin in them including headache powders such as BC or Goody's), Excedrin migraine. ?  ?Day Before Surgery at Home ?-You will be advised you can have clear liquids up until 3 hours before your surgery.   ?  ?Your role in recovery ?Your role is to become active as soon as directed by your doctor, while still giving yourself time to heal.  Rest when you feel tired. You will be asked to do the following in order to speed your recovery: ?  ?- Cough and breathe deeply. This helps to clear and expand your lungs and can  prevent pneumonia after surgery.  ?- STAY ACTIVE WHEN YOU GET HOME. Do mild physical activity. Walking or moving your legs help your circulation and body functions return to normal. Do not try to get up or walk alone the first time after surgery.   ?-If you develop swelling on one leg or the other, pain in the back of your leg, redness/warmth in one of your legs, please call the office or go to the Emergency Room to have a doppler to rule out a blood clot. For shortness of breath, chest pain-seek care in the Emergency Room as soon as possible. ?- Actively manage your pain. Managing your pain lets you move in comfort. We will ask you to rate your pain on a scale of zero to 10. It is your responsibility to tell your doctor or nurse where and how much you hurt so your pain can be treated. ?  ?Special Considerations ?-If you are diabetic, you may be placed on insulin after surgery to have closer control over your blood sugars to promote healing and recovery.  This does not mean that you will be discharged on insulin.  If applicable, your oral antidiabetics will be resumed when you are tolerating a solid diet. ?  ?-Your final pathology results from surgery should be available around one week after surgery and the results will be relayed to you when available. ?  ?-  Dr. Lahoma Hurst is the surgeon that assists your GYN Oncologist with surgery.  If you end up staying the night, the next day after your surgery you will either see Dr. Berline Hurst or Dr. Lahoma Hurst. ?  ?-FMLA forms can be faxed to [phone] and please allow 5-7 business days for completion. ?  ?Pain Management After Surgery ?-You will be prescribed your pain medication and bowel regimen medications before surgery and closer to the surgery date so that you can have these available when you are discharged from the hospital. The pain medication is for use ONLY AFTER surgery and a new prescription will not be given.  ?  ?-Make sure that you have  Tylenol and Ibuprofen at home to use on a regular basis after surgery for pain control. We recommend alternating the medications every hour to six hours since they work differently and are processed in the body differently for pain relief. ?  ?-Review the attached handout on narcotic use and their risks and side effects.  ?  ?Bowel Regimen ?-You will be prescribed Sennakot-S to take nightly to prevent constipation especially if you are taking the narcotic pain medication intermittently.  It is important to prevent constipation and drink adequate amounts of liquids. You can stop taking this medication when you are not taking pain medication and you are back on your normal bowel routine. ?  ?Risks of Surgery ?Risks of surgery are low but include bleeding, infection, damage to surrounding structures, re-operation, blood clots, and very rarely death. ?  ?  ?Blood Transfusion Information (For the consent to be signed before surgery) ?  ?We will be checking your blood type before surgery so in case of emergencies, we will know what type of blood you would need. ?  ?                                          WHAT IS A BLOOD TRANSFUSION? ?  ?A transfusion is the replacement of blood or some of its parts. Blood is made up of multiple cells which provide different functions. ?Red blood cells carry oxygen and are used for blood loss replacement. ?White blood cells fight against infection. ?Platelets control bleeding. ?Plasma helps clot blood. ?Other blood products are available for specialized needs, such as hemophilia or other clotting disorders. ?BEFORE THE TRANSFUSION  ?Who gives blood for transfusions?  ?You may be able to donate blood to be used at a later date on yourself (autologous donation). ?Relatives can be asked to donate blood. This is generally not any safer than if you have received blood from a stranger. The same precautions are taken to ensure safety when a relative's blood is donated. ?Healthy volunteers who  are fully evaluated to make sure their blood is safe. This is blood bank blood. ?Transfusion therapy is the safest it has ever been in the practice of medicine. Before blood is taken from a donor, a complete history is taken to make sure that person has no history of diseases nor engages in risky social behavior (examples are intravenous drug use or sexual activity with multiple partners). The donor's travel history is screened to minimize risk of transmitting infections, such as malaria. The donated blood is tested for signs of infectious diseases, such as HIV and hepatitis. The blood is then tested to be sure it is compatible with you in order to minimize the chance  of a transfusion reaction. If you or a relative donates blood, this is often done in anticipation of surgery and is not appropriate for emergency situations. It takes many days to process the donated blood. ?RISKS AND COMPLICATIONS ?Although transfusion therapy is very safe and saves many lives, the main dangers of transfusion include:  ?Getting an infectious disease. ?Developing a transfusion reaction. This is an allergic reaction to something in the blood you were given. Every precaution is taken to prevent this. ?The decision to have a blood transfusion has been considered carefully by your caregiver before blood is given. Blood is not given unless the benefits outweigh the risks. ?  ?AFTER SURGERY INSTRUCTIONS ?  ?We recommend purchasing several bags of frozen green peas and dividing them into ziploc bags. You will want to keep these in the freezer and have them ready to use as ice packs to the vulvar incision. Once the ice pack is no longer cold, you can get another from the freezer. The frozen peas mold to your body better than a regular ice pack.  ?  ?You will be discharged home with two drains in place (left, right groin). These will need to be emptied on a regular basis during the day and output recorded. ?  ?Return to work: 4-6 weeks if  applicable ?  ?Activity: ?1. Be up and out of the bed during the day.  Take a nap if needed.  You may walk up steps but be careful and use the hand rail.  Stair climbing will tire you more than you think, you may

## 2021-09-24 ENCOUNTER — Encounter: Payer: Self-pay | Admitting: Gynecologic Oncology

## 2021-09-24 LAB — CYTOLOGY - PAP
Comment: NEGATIVE
High risk HPV: POSITIVE — AB

## 2021-09-26 ENCOUNTER — Telehealth: Payer: Self-pay | Admitting: *Deleted

## 2021-09-26 NOTE — Telephone Encounter (Signed)
Per patient request rescheduled PET scan from 4/19 to 4/27.  ?

## 2021-09-26 NOTE — Telephone Encounter (Signed)
Spoke with pt to confirm OR date of May 23. Pt stated that date is good with her. She wants to get the colposcopy with possible biopsies during the surgery.  Informed her of her new PET scan date of 4/27 @ 9am. Pt verbalized understanding and agreement.  ?

## 2021-09-26 NOTE — Telephone Encounter (Signed)
Unable to reach pt this afternoon. Lvm for return call.  ?

## 2021-09-29 ENCOUNTER — Encounter: Payer: Self-pay | Admitting: Gynecologic Oncology

## 2021-10-08 ENCOUNTER — Ambulatory Visit (HOSPITAL_COMMUNITY): Payer: Medicare Other

## 2021-10-09 ENCOUNTER — Telehealth: Payer: Self-pay | Admitting: *Deleted

## 2021-10-09 NOTE — Telephone Encounter (Signed)
Attempted to reach pt to see if the surgery date of 11/11/21 still worked for her. Unable to reach pt. LVM for return call.  ?

## 2021-10-10 ENCOUNTER — Telehealth: Payer: Self-pay | Admitting: *Deleted

## 2021-10-10 NOTE — Telephone Encounter (Signed)
Attempted to speak with pt this morning to see if the surgery date will works for her. Unable to reach pt. LVM for return call.  ?

## 2021-10-10 NOTE — Telephone Encounter (Signed)
Spoke with pt this afternoon to confirm surgery date of 11/11/21. Pt stated that date still works for her. [name], NP notified.  ?

## 2021-10-10 NOTE — Telephone Encounter (Signed)
Attempted to return pt phone call this afternoon. Unable to reach her. LVM for return call ? ?

## 2021-10-13 NOTE — Telephone Encounter (Signed)
error 

## 2021-10-16 ENCOUNTER — Ambulatory Visit (HOSPITAL_COMMUNITY)
Admission: RE | Admit: 2021-10-16 | Discharge: 2021-10-16 | Disposition: A | Discharge status: Home / Self Care | Payer: Medicare Other | Source: Ambulatory Visit | Attending: Gynecologic Oncology | Admitting: Gynecologic Oncology

## 2021-10-16 ENCOUNTER — Encounter: Payer: Self-pay | Admitting: Gynecologic Oncology

## 2021-10-16 DIAGNOSIS — C519 Malignant neoplasm of vulva, unspecified: Secondary | ICD-10-CM | POA: Diagnosis present

## 2021-10-16 DIAGNOSIS — R918 Other nonspecific abnormal finding of lung field: Secondary | ICD-10-CM | POA: Diagnosis not present

## 2021-10-16 DIAGNOSIS — J341 Cyst and mucocele of nose and nasal sinus: Secondary | ICD-10-CM | POA: Diagnosis not present

## 2021-10-16 DIAGNOSIS — I700 Atherosclerosis of aorta: Secondary | ICD-10-CM | POA: Diagnosis not present

## 2021-10-16 LAB — GLUCOSE, CAPILLARY: Glucose-Capillary: 110 mg/dL — ABNORMAL HIGH (ref 70–99)

## 2021-10-16 MED ORDER — FLUDEOXYGLUCOSE F - 18 (FDG) INJECTION
5.5000 | Freq: Once | INTRAVENOUS | Status: AC | PRN
  Administered 2021-10-16: 5.5 via INTRAVENOUS

## 2021-11-03 ENCOUNTER — Telehealth: Payer: Self-pay | Admitting: Primary Care

## 2021-11-03 NOTE — Telephone Encounter (Signed)
Left message for patient to call back and schedule Medicare Annual Wellness Visit (AWV) either virtually or phone ? ?Last AWV 11/26/14 ? please schedule at anytime with health coach ? ?I left my direct # [phone] ?

## 2021-11-04 ENCOUNTER — Inpatient Hospital Stay: Payer: Medicare Other | Attending: Gynecologic Oncology | Admitting: Gynecologic Oncology

## 2021-11-04 ENCOUNTER — Encounter: Payer: Self-pay | Admitting: Gynecologic Oncology

## 2021-11-04 DIAGNOSIS — C519 Malignant neoplasm of vulva, unspecified: Secondary | ICD-10-CM

## 2021-11-04 NOTE — Patient Instructions (Signed)
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 Berline Lopes will contact you with the results.  ?  ?Preparing for your Surgery ?  ?Plan for surgery on Nov 11, 2021 with Dr. Jeral Pinch at [facility] will be scheduled for a radical vulvectomy (removing the mass and surrounding tissue on the vulva), bilateral inguinal lymph node dissection (removing the lymph node in both groins to test for cancer spread).  ?  ?You can expect to have an overnight stay in the hospital. You will be discharged home with two drains in place (left, right groin). These will need to be emptied on a regular basis during the day and output recorded. ?  ?Pre-operative Testing ?-You will receive a phone call from presurgical testing at [facility] to arrange for a pre-operative appointment and lab work. ?  ?-Bring your insurance card, copy of an advanced directive if applicable, medication list ?  ?-At that visit, you will be asked to sign a consent for a possible blood transfusion in case a transfusion becomes necessary during surgery.  The need for a blood transfusion is rare but having consent is a necessary part of your care.    ?  ?-You should not be taking blood thinners or aspirin at least ten days prior to surgery unless instructed by your surgeon. ?  ?-Do not take supplements such as fish oil (omega 3), red yeast rice, turmeric before your surgery. You want to avoid medications with aspirin in them including headache powders such as BC or Goody's), Excedrin migraine. ?  ?Day Before Surgery at Home ?-You will be advised you can have clear liquids up until 3 hours before your surgery.   ?  ?Your role in recovery ?Your role is to become active as soon as directed by your doctor, while still giving yourself time to heal.  Rest when you feel tired. You will be asked to do the following in order to speed your recovery: ?  ?- Cough and breathe deeply. This helps to clear and expand your lungs and can  prevent pneumonia after surgery.  ?- STAY ACTIVE WHEN YOU GET HOME. Do mild physical activity. Walking or moving your legs help your circulation and body functions return to normal. Do not try to get up or walk alone the first time after surgery.   ?-If you develop swelling on one leg or the other, pain in the back of your leg, redness/warmth in one of your legs, please call the office or go to the Emergency Room to have a doppler to rule out a blood clot. For shortness of breath, chest pain-seek care in the Emergency Room as soon as possible. ?- Actively manage your pain. Managing your pain lets you move in comfort. We will ask you to rate your pain on a scale of zero to 10. It is your responsibility to tell your doctor or nurse where and how much you hurt so your pain can be treated. ?  ?Special Considerations ?-If you are diabetic, you may be placed on insulin after surgery to have closer control over your blood sugars to promote healing and recovery.  This does not mean that you will be discharged on insulin.  If applicable, your oral antidiabetics will be resumed when you are tolerating a solid diet. ?  ?-Your final pathology results from surgery should be available around one week after surgery and the results will be relayed to you when available. ?  ?-  Dr. Lahoma Crocker is the surgeon that assists your GYN Oncologist with surgery.  If you end up staying the night, the next day after your surgery you will either see Dr. Berline Lopes or Dr. Lahoma Crocker. ?  ?-FMLA forms can be faxed to [phone] and please allow 5-7 business days for completion. ?  ?Pain Management After Surgery ?-You will be prescribed your pain medication and bowel regimen medications before surgery and closer to the surgery date so that you can have these available when you are discharged from the hospital. The pain medication is for use ONLY AFTER surgery and a new prescription will not be given.  ?  ?-Make sure that you have  Tylenol and Ibuprofen at home to use on a regular basis after surgery for pain control. We recommend alternating the medications every hour to six hours since they work differently and are processed in the body differently for pain relief. ?  ?-Review the attached handout on narcotic use and their risks and side effects.  ?  ?Bowel Regimen ?-You will be prescribed Sennakot-S to take nightly to prevent constipation especially if you are taking the narcotic pain medication intermittently.  It is important to prevent constipation and drink adequate amounts of liquids. You can stop taking this medication when you are not taking pain medication and you are back on your normal bowel routine. ?  ?Risks of Surgery ?Risks of surgery are low but include bleeding, infection, damage to surrounding structures, re-operation, blood clots, and very rarely death. ?  ?  ?Blood Transfusion Information (For the consent to be signed before surgery) ?  ?We will be checking your blood type before surgery so in case of emergencies, we will know what type of blood you would need. ?  ?                                          WHAT IS A BLOOD TRANSFUSION? ?  ?A transfusion is the replacement of blood or some of its parts. Blood is made up of multiple cells which provide different functions. ?Red blood cells carry oxygen and are used for blood loss replacement. ?White blood cells fight against infection. ?Platelets control bleeding. ?Plasma helps clot blood. ?Other blood products are available for specialized needs, such as hemophilia or other clotting disorders. ?BEFORE THE TRANSFUSION  ?Who gives blood for transfusions?  ?You may be able to donate blood to be used at a later date on yourself (autologous donation). ?Relatives can be asked to donate blood. This is generally not any safer than if you have received blood from a stranger. The same precautions are taken to ensure safety when a relative's blood is donated. ?Healthy volunteers who  are fully evaluated to make sure their blood is safe. This is blood bank blood. ?Transfusion therapy is the safest it has ever been in the practice of medicine. Before blood is taken from a donor, a complete history is taken to make sure that person has no history of diseases nor engages in risky social behavior (examples are intravenous drug use or sexual activity with multiple partners). The donor's travel history is screened to minimize risk of transmitting infections, such as malaria. The donated blood is tested for signs of infectious diseases, such as HIV and hepatitis. The blood is then tested to be sure it is compatible with you in order to minimize the chance  of a transfusion reaction. If you or a relative donates blood, this is often done in anticipation of surgery and is not appropriate for emergency situations. It takes many days to process the donated blood. ?RISKS AND COMPLICATIONS ?Although transfusion therapy is very safe and saves many lives, the main dangers of transfusion include:  ?Getting an infectious disease. ?Developing a transfusion reaction. This is an allergic reaction to something in the blood you were given. Every precaution is taken to prevent this. ?The decision to have a blood transfusion has been considered carefully by your caregiver before blood is given. Blood is not given unless the benefits outweigh the risks. ?  ?AFTER SURGERY INSTRUCTIONS ?  ?We recommend purchasing several bags of frozen green peas and dividing them into ziploc bags. You will want to keep these in the freezer and have them ready to use as ice packs to the vulvar incision. Once the ice pack is no longer cold, you can get another from the freezer. The frozen peas mold to your body better than a regular ice pack.  ?  ?You will be discharged home with two drains in place (left, right groin). These will need to be emptied on a regular basis during the day and output recorded. ?  ?Return to work: 4-6 weeks if  applicable ?  ?Activity: ?1. Be up and out of the bed during the day.  Take a nap if needed.  You may walk up steps but be careful and use the hand rail.  Stair climbing will tire you more than you think, you may

## 2021-11-04 NOTE — Progress Notes (Signed)
Called patient to discuss pre-operative instructions prior to her scheduled surgery on Nov 11, 2021. She is scheduled for radical vulvectomy, bilateral inguinal lymph node dissection. See after visit summary for additional details. Her husband passed away on 11-15-2021 and she is having the funeral this weekend. She is unsure where the information from her last visit is currently and I will plan to send a mychart message with the information. At her last visit, JP drains were discussed along with preop and post-op instructions. The only medications she is currently taking is alka seltzer cough and chest congestion and benadryl at bedtime. She has had some bleeding from the vulvar mass and states it lasts around 15-20 seconds then stops. Denies symptoms related to this such as weakness.    ?   ?5 minutes spent with the patient on the phone  Verbalizing understanding of material discussed. No needs or concerns voiced. Advised patient to call for any needs. Patient advised she will receive another phone call from our RN beginning of next week to review information. ? ?This appointment is included in the global surgical bundle as pre-operative teaching and has no charge.     ?

## 2021-11-05 NOTE — Patient Instructions (Signed)
DUE TO COVID-19 ONLY TWO VISITORS  (aged 64 and older)  ARE ALLOWED TO COME WITH YOU AND STAY IN THE WAITING ROOM ONLY DURING PRE OP AND PROCEDURE.   ?**NO VISITORS ARE ALLOWED IN THE SHORT STAY AREA OR RECOVERY ROOM!!** ? ?IF YOU WILL BE ADMITTED INTO THE HOSPITAL YOU ARE ALLOWED ONLY FOUR SUPPORT PEOPLE DURING VISITATION HOURS ONLY (7 AM -8PM)   ?The support person(s) must pass our screening, gel in and out, and wear a mask at all times, including in the patient?s room. ?Patients must also wear a mask when staff or their support person are in the room. ?Visitors GUEST BADGE MUST BE WORN VISIBLY  ?One adult visitor may remain with you overnight and MUST be in the room by 8 P.M. ?  ? ? Your procedure is scheduled on: 11/11/21 ? ? Report to [facility] Main Entrance ? ?  Report to admitting at : 12:00 PM ? ? Call this number if you have problems the morning of surgery [phone] ? ? Do not eat food :After Midnight. ? ? After Midnight you may have the following liquids until : 11:00 AM/ DAY OF SURGERY ? ?Water ?Black Coffee (sugar ok, NO MILK/CREAM OR CREAMERS)  ?Tea (sugar ok, NO MILK/CREAM OR CREAMERS) regular and decaf                             ?Plain Jell-O (NO RED)                                           ?Fruit ices (not with fruit pulp, NO RED)                                     ?Popsicles (NO RED)                                                                  ?Juice: apple, WHITE grape, WHITE cranberry ?Sports drinks like Gatorade (NO RED) ?Clear broth(vegetable,chicken,beef) ? ?FOLLOW BOWEL PREP AND ANY ADDITIONAL PRE OP INSTRUCTIONS YOU RECEIVED FROM YOUR SURGEON'S OFFICE!!! ?  ?Oral Hygiene is also important to reduce your risk of infection.                                    ?Remember - BRUSH YOUR TEETH THE MORNING OF SURGERY WITH YOUR REGULAR TOOTHPASTE ? ? Do NOT smoke after Midnight ? ? Take these medicines the morning of surgery with A SIP OF WATER: N/A. Use inhalers as usual. ? ?DO NOT  TAKE ANY ORAL DIABETIC MEDICATIONS DAY OF YOUR SURGERY ? ?Bring CPAP mask and tubing day of surgery. ?                  ?           You may not have any metal on your body including hair pins, jewelry, and body piercing ? ?  Do not wear make-up, lotions, powders, perfumes/cologne, or deodorant ? ?Do not wear nail polish including gel and S&S, artificial/acrylic nails, or any other type of covering on natural nails including finger and toenails. If you have artificial nails, gel coating, etc. that needs to be removed by a nail salon please have this removed prior to surgery or surgery may need to be canceled/ delayed if the surgeon/ anesthesia feels like they are unable to be safely monitored.  ? ?Do not shave  48 hours prior to surgery.  ? ? Do not bring valuables to the hospital. [facility] NOT ?            RESPONSIBLE   FOR VALUABLES. ? ? Contacts, dentures or bridgework may not be worn into surgery. ? ? Bring small overnight bag day of surgery. ?  ? Patients discharged on the day of surgery will not be allowed to drive home.  Someone NEEDS to stay with you for the first 24 hours after anesthesia. ? ? Special Instructions: Bring a copy of your healthcare power of attorney and living will documents         the day of surgery if you haven't scanned them before. ? ?            Please read over the following fact sheets you were given: IF [place] [phone] ? ?   [place] - Preparing for Surgery ?Before surgery, you can play an important role.  Because skin is not sterile, your skin needs to be as free of germs as possible.  You can reduce the number of germs on your skin by washing with CHG (chlorahexidine gluconate) soap before surgery.  CHG is an antiseptic cleaner which kills germs and bonds with the skin to continue killing germs even after washing. ?Please DO NOT use if you have an allergy to CHG or antibacterial soaps.  If your skin becomes  reddened/irritated stop using the CHG and inform your nurse when you arrive at Short Stay. ?Do not shave (including legs and underarms) for at least 48 hours prior to the first CHG shower.  You may shave your face/neck. ?Please follow these instructions carefully: ? 1.  Shower with CHG Soap the night before surgery and the  morning of Surgery. ? 2.  If you choose to wash your hair, wash your hair first as usual with your  normal  shampoo. ? 3.  After you shampoo, rinse your hair and body thoroughly to remove the  shampoo.                           4.  Use CHG as you would any other liquid soap.  You can apply chg directly  to the skin and wash  ?                     Gently with a scrungie or clean washcloth. ? 5.  Apply the CHG Soap to your body ONLY FROM THE NECK DOWN.   Do not use on face/ open      ?                     Wound or open sores. Avoid contact with eyes, ears mouth and genitals (private parts).  ?  Wash face,  Genitals (private parts) with your normal soap. ?            6.  Wash thoroughly, paying special attention to the area where your surgery  will be performed. ? 7.  Thoroughly rinse your body with warm water from the neck down. ? 8.  DO NOT shower/wash with your normal soap after using and rinsing off  the CHG Soap. ?               9.  Pat yourself dry with a clean towel. ?           10.  Wear clean pajamas. ?           11.  Place clean sheets on your bed the night of your first shower and do not  sleep with pets. ?Day of Surgery : ?Do not apply any lotions/deodorants the morning of surgery.  Please wear clean clothes to the hospital/surgery center. ? ?FAILURE TO FOLLOW THESE INSTRUCTIONS MAY RESULT IN THE CANCELLATION OF YOUR SURGERY ?PATIENT SIGNATURE_________________________________ ? ?NURSE SIGNATURE__________________________________ ? ?________________________________________________________________________  ?

## 2021-11-06 ENCOUNTER — Telehealth: Payer: Self-pay

## 2021-11-06 ENCOUNTER — Encounter (HOSPITAL_COMMUNITY): Payer: Self-pay

## 2021-11-06 ENCOUNTER — Encounter (HOSPITAL_COMMUNITY)
Admission: RE | Admit: 2021-11-06 | Discharge: 2021-11-06 | Disposition: A | Discharge status: Home / Self Care | Payer: Medicare Other | Source: Ambulatory Visit | Attending: Gynecologic Oncology | Admitting: Gynecologic Oncology

## 2021-11-06 DIAGNOSIS — Z01812 Encounter for preprocedural laboratory examination: Secondary | ICD-10-CM | POA: Diagnosis not present

## 2021-11-06 DIAGNOSIS — C519 Malignant neoplasm of vulva, unspecified: Secondary | ICD-10-CM | POA: Insufficient documentation

## 2021-11-06 HISTORY — DX: Anemia, unspecified: D64.9

## 2021-11-06 HISTORY — DX: Other complications of anesthesia, initial encounter: T88.59XA

## 2021-11-06 HISTORY — DX: Nausea with vomiting, unspecified: R11.2

## 2021-11-06 HISTORY — DX: Other specified postprocedural states: Z98.890

## 2021-11-06 LAB — CBC
HCT: 39.1 % (ref 36.0–46.0)
Hemoglobin: 13.8 g/dL (ref 12.0–15.0)
MCH: 40.7 pg — ABNORMAL HIGH (ref 26.0–34.0)
MCHC: 35.3 g/dL (ref 30.0–36.0)
MCV: 115.3 fL — ABNORMAL HIGH (ref 80.0–100.0)
Platelets: 142 10*3/uL — ABNORMAL LOW (ref 150–400)
RBC: 3.39 MIL/uL — ABNORMAL LOW (ref 3.87–5.11)
RDW: 11.3 % — ABNORMAL LOW (ref 11.5–15.5)
WBC: 5.7 10*3/uL (ref 4.0–10.5)
nRBC: 0 % (ref 0.0–0.2)

## 2021-11-06 LAB — COMPREHENSIVE METABOLIC PANEL
ALT: 12 U/L (ref 0–44)
AST: 21 U/L (ref 15–41)
Albumin: 4.2 g/dL (ref 3.5–5.0)
Alkaline Phosphatase: 49 U/L (ref 38–126)
Anion gap: 7 (ref 5–15)
BUN: 11 mg/dL (ref 8–23)
CO2: 24 mmol/L (ref 22–32)
Calcium: 9.5 mg/dL (ref 8.9–10.3)
Chloride: 98 mmol/L (ref 98–111)
Creatinine, Ser: 0.72 mg/dL (ref 0.44–1.00)
GFR, Estimated: >60 mL/min (ref >60)
Glucose, Bld: 100 mg/dL — ABNORMAL HIGH (ref 70–99)
Potassium: 4.6 mmol/L (ref 3.5–5.1)
Sodium: 129 mmol/L — ABNORMAL LOW (ref 135–145)
Total Bilirubin: 0.5 mg/dL (ref 0.3–1.2)
Total Protein: 7.6 g/dL (ref 6.5–8.1)

## 2021-11-06 NOTE — Progress Notes (Signed)
For Short Stay: [place] appointment date: Date of COVID positive in last 53 days:  Bowel Prep reminder:   For Anesthesia: PCP - Alma Friendly: NP Cardiologist -   Chest x-ray -  EKG -  Stress Test -  ECHO -  Cardiac Cath -  Pacemaker/ICD device last checked: Pacemaker orders received: Device Rep notified:  Spinal Cord Stimulator:  Sleep Study -  CPAP -   Fasting Blood Sugar -  Checks Blood Sugar _____ times a day Date and result of last Hgb A1c-  Blood Thinner Instructions: Aspirin Instructions: Last Dose:  Activity level: Can go up a flight of stairs and activities of daily living without stopping and without chest pain and/or shortness of breath   Able to exercise without chest pain and/or shortness of breath   Unable to go up a flight of stairs without chest pain and/or shortness of breath     Anesthesia review: Hx: Smoker  Patient denies shortness of breath, fever, cough and chest pain at PAT appointment   Patient verbalized understanding of instructions that were given to them at the PAT appointment. Patient was also instructed that they will need to review over the PAT instructions again at home before surgery.

## 2021-11-06 NOTE — Telephone Encounter (Signed)
Pre-op lab results from today reviewed with patient. Pt aware sodium is low, Per [name] NP, do not restrict sodium. Pt states her husband just passed away and she hasn't been eating well. Pt understood dietary need for sodium at this time. KJ CMA

## 2021-11-06 NOTE — Progress Notes (Signed)
Lab. Results: Sodium: 129

## 2021-11-10 ENCOUNTER — Encounter (HOSPITAL_COMMUNITY): Admission: RE | Admit: 2021-11-10 | Payer: Medicare Other | Source: Ambulatory Visit

## 2021-11-10 ENCOUNTER — Telehealth: Payer: Self-pay | Admitting: *Deleted

## 2021-11-10 NOTE — Telephone Encounter (Signed)
Telephone call to check on pre-operative status.  Patient compliant with pre-operative instructions.  Reinforced nothing to eat after midnight. Clear liquids until 11am. Patient to arrive at 1200.  No questions or concerns voiced.  Instructed to call for any needs.

## 2021-11-11 ENCOUNTER — Encounter (HOSPITAL_COMMUNITY): Payer: Self-pay | Admitting: Gynecologic Oncology

## 2021-11-11 ENCOUNTER — Inpatient Hospital Stay (HOSPITAL_COMMUNITY): Payer: Medicare Other | Admitting: Certified Registered"

## 2021-11-11 ENCOUNTER — Other Ambulatory Visit: Payer: Self-pay

## 2021-11-11 ENCOUNTER — Encounter (HOSPITAL_COMMUNITY)
Admission: RE | Disposition: A | Discharge status: Home / Self Care | Payer: Self-pay | Source: Home / Self Care | Attending: Gynecologic Oncology

## 2021-11-11 ENCOUNTER — Inpatient Hospital Stay (HOSPITAL_COMMUNITY)
Admission: RE | Admit: 2021-11-11 | Discharge: 2021-11-12 | DRG: 746 | Disposition: A | Discharge status: Home / Self Care | Payer: Medicare Other | Attending: Gynecologic Oncology | Admitting: Gynecologic Oncology

## 2021-11-11 DIAGNOSIS — Z90710 Acquired absence of both cervix and uterus: Secondary | ICD-10-CM

## 2021-11-11 DIAGNOSIS — J449 Chronic obstructive pulmonary disease, unspecified: Secondary | ICD-10-CM

## 2021-11-11 DIAGNOSIS — Z8249 Family history of ischemic heart disease and other diseases of the circulatory system: Secondary | ICD-10-CM

## 2021-11-11 DIAGNOSIS — F17210 Nicotine dependence, cigarettes, uncomplicated: Secondary | ICD-10-CM | POA: Diagnosis not present

## 2021-11-11 DIAGNOSIS — Z85828 Personal history of other malignant neoplasm of skin: Secondary | ICD-10-CM

## 2021-11-11 DIAGNOSIS — Z800 Family history of malignant neoplasm of digestive organs: Secondary | ICD-10-CM

## 2021-11-11 DIAGNOSIS — E871 Hypo-osmolality and hyponatremia: Secondary | ICD-10-CM | POA: Diagnosis present

## 2021-11-11 DIAGNOSIS — C519 Malignant neoplasm of vulva, unspecified: Principal | ICD-10-CM | POA: Diagnosis present

## 2021-11-11 DIAGNOSIS — Z803 Family history of malignant neoplasm of breast: Secondary | ICD-10-CM | POA: Diagnosis not present

## 2021-11-11 HISTORY — PX: INGUINAL LYMPHADENECTOMY: SHX6587

## 2021-11-11 HISTORY — PX: RADICAL VULVECTOMY: SHX6584

## 2021-11-11 HISTORY — PX: COLPOSCOPY: SHX161

## 2021-11-11 HISTORY — DX: Malignant neoplasm of vulva, unspecified: C51.9

## 2021-11-11 LAB — BASIC METABOLIC PANEL
Anion gap: 10 (ref 5–15)
BUN: 9 mg/dL (ref 8–23)
CO2: 25 mmol/L (ref 22–32)
Calcium: 9.5 mg/dL (ref 8.9–10.3)
Chloride: 97 mmol/L — ABNORMAL LOW (ref 98–111)
Creatinine, Ser: 0.66 mg/dL (ref 0.44–1.00)
GFR, Estimated: >60 mL/min (ref >60)
Glucose, Bld: 100 mg/dL — ABNORMAL HIGH (ref 70–99)
Potassium: 3.7 mmol/L (ref 3.5–5.1)
Sodium: 132 mmol/L — ABNORMAL LOW (ref 135–145)

## 2021-11-11 LAB — TYPE AND SCREEN
ABO/RH(D): A POS
Antibody Screen: NEGATIVE

## 2021-11-11 LAB — HIV ANTIBODY (ROUTINE TESTING W REFLEX): HIV Screen 4th Generation wRfx: NONREACTIVE

## 2021-11-11 LAB — ABO/RH: ABO/RH(D): A POS

## 2021-11-11 SURGERY — VULVECTOMY, RADICAL
Anesthesia: General | Site: Vagina

## 2021-11-11 MED ORDER — MIDAZOLAM HCL 2 MG/2ML IJ SOLN
INTRAMUSCULAR | Status: AC
  Filled 2021-11-11: qty 2

## 2021-11-11 MED ORDER — HYDROMORPHONE HCL 1 MG/ML IJ SOLN
INTRAMUSCULAR | Status: AC
  Filled 2021-11-11: qty 1

## 2021-11-11 MED ORDER — BUPIVACAINE LIPOSOME 1.3 % IJ SUSP
INTRAMUSCULAR | Status: DC | PRN
  Administered 2021-11-11: 20 mL

## 2021-11-11 MED ORDER — TRAMADOL HCL 50 MG PO TABS: 50.0000 mg | ORAL_TABLET | Freq: Four times a day (QID) | ORAL | Status: DC | PRN

## 2021-11-11 MED ORDER — HEPARIN SODIUM (PORCINE) 5000 UNIT/ML IJ SOLN
5000.0000 [IU] | INTRAMUSCULAR | Status: AC
  Administered 2021-11-11: 5000 [IU] via SUBCUTANEOUS
  Filled 2021-11-11: qty 1

## 2021-11-11 MED ORDER — CIPROFLOXACIN IN D5W 400 MG/200ML IV SOLN
400.0000 mg | INTRAVENOUS | Status: AC
  Administered 2021-11-11: 400 mg via INTRAVENOUS
  Filled 2021-11-11: qty 200

## 2021-11-11 MED ORDER — IODINE STRONG (LUGOLS) 5 % PO SOLN
ORAL | Status: DC | PRN
  Administered 2021-11-11: 14 mL

## 2021-11-11 MED ORDER — KCL IN DEXTROSE-NACL 20-5-0.45 MEQ/L-%-% IV SOLN
INTRAVENOUS | Status: DC
  Filled 2021-11-11: qty 1000

## 2021-11-11 MED ORDER — HYDROMORPHONE HCL 1 MG/ML IJ SOLN
INTRAMUSCULAR | Status: DC | PRN
  Administered 2021-11-11 (×2): .4 mg via INTRAVENOUS

## 2021-11-11 MED ORDER — FERRIC SUBSULFATE 259 MG/GM EX SOLN
CUTANEOUS | Status: AC
  Filled 2021-11-11: qty 8

## 2021-11-11 MED ORDER — OXYCODONE HCL 5 MG/5ML PO SOLN: 5.0000 mg | Freq: Once | ORAL | Status: DC | PRN

## 2021-11-11 MED ORDER — PROMETHAZINE HCL 25 MG/ML IJ SOLN
INTRAMUSCULAR | Status: AC
  Filled 2021-11-11: qty 1

## 2021-11-11 MED ORDER — LIDOCAINE 2% (20 MG/ML) 5 ML SYRINGE
INTRAMUSCULAR | Status: DC | PRN
Start: 2021-11-11 — End: 2021-11-11
  Administered 2021-11-11: 40 mg via INTRAVENOUS
  Administered 2021-11-11: 1.5 mg/kg/h via INTRAVENOUS

## 2021-11-11 MED ORDER — BUPIVACAINE LIPOSOME 1.3 % IJ SUSP
INTRAMUSCULAR | Status: AC
  Filled 2021-11-11: qty 20

## 2021-11-11 MED ORDER — VANCOMYCIN HCL IN DEXTROSE 1-5 GM/200ML-% IV SOLN
1000.0000 mg | Freq: Once | INTRAVENOUS | Status: AC
  Administered 2021-11-11: 1000 mg via INTRAVENOUS
  Filled 2021-11-11: qty 200

## 2021-11-11 MED ORDER — ACETAMINOPHEN 500 MG PO TABS
1000.0000 mg | ORAL_TABLET | Freq: Four times a day (QID) | ORAL | Status: DC
  Administered 2021-11-11 – 2021-11-12 (×3): 1000 mg via ORAL
  Filled 2021-11-11 (×3): qty 2

## 2021-11-11 MED ORDER — CHLORHEXIDINE GLUCONATE 0.12 % MT SOLN: 15.0000 mL | Freq: Once | OROMUCOSAL | Status: DC

## 2021-11-11 MED ORDER — SUCCINYLCHOLINE CHLORIDE 200 MG/10ML IV SOSY
PREFILLED_SYRINGE | INTRAVENOUS | Status: AC
  Filled 2021-11-11: qty 10

## 2021-11-11 MED ORDER — IODINE STRONG (LUGOLS) 5 % PO SOLN
ORAL | Status: AC
  Filled 2021-11-11: qty 1

## 2021-11-11 MED ORDER — ENOXAPARIN SODIUM 40 MG/0.4ML IJ SOSY
40.0000 mg | PREFILLED_SYRINGE | INTRAMUSCULAR | Status: DC
  Administered 2021-11-12: 40 mg via SUBCUTANEOUS
  Filled 2021-11-11: qty 0.4

## 2021-11-11 MED ORDER — ACETAMINOPHEN 500 MG PO TABS
1000.0000 mg | ORAL_TABLET | ORAL | Status: AC
  Administered 2021-11-11: 1000 mg via ORAL
  Filled 2021-11-11: qty 2

## 2021-11-11 MED ORDER — ROCURONIUM BROMIDE 10 MG/ML (PF) SYRINGE
PREFILLED_SYRINGE | INTRAVENOUS | Status: AC
  Filled 2021-11-11: qty 10

## 2021-11-11 MED ORDER — FENTANYL CITRATE (PF) 100 MCG/2ML IJ SOLN
INTRAMUSCULAR | Status: AC
Start: 2021-11-11 — End: ?
  Filled 2021-11-11: qty 2

## 2021-11-11 MED ORDER — DEXAMETHASONE SODIUM PHOSPHATE 10 MG/ML IJ SOLN
INTRAMUSCULAR | Status: AC
  Filled 2021-11-11: qty 1

## 2021-11-11 MED ORDER — MOMETASONE FURO-FORMOTEROL FUM 200-5 MCG/ACT IN AERO
2.0000 | INHALATION_SPRAY | Freq: Two times a day (BID) | RESPIRATORY_TRACT | Status: DC
  Administered 2021-11-12: 2 via RESPIRATORY_TRACT
  Filled 2021-11-11: qty 8.8

## 2021-11-11 MED ORDER — ESMOLOL HCL 100 MG/10ML IV SOLN
INTRAVENOUS | Status: AC
  Filled 2021-11-11: qty 20

## 2021-11-11 MED ORDER — LACTATED RINGERS IV SOLN: INTRAVENOUS | Status: DC

## 2021-11-11 MED ORDER — ONDANSETRON HCL 4 MG/2ML IJ SOLN
INTRAMUSCULAR | Status: AC
  Filled 2021-11-11: qty 2

## 2021-11-11 MED ORDER — FENTANYL CITRATE (PF) 100 MCG/2ML IJ SOLN
INTRAMUSCULAR | Status: DC | PRN
  Administered 2021-11-11 (×2): 25 ug via INTRAVENOUS
  Administered 2021-11-11: 50 ug via INTRAVENOUS
  Administered 2021-11-11: 100 ug via INTRAVENOUS

## 2021-11-11 MED ORDER — PROPOFOL 10 MG/ML IV BOLUS
INTRAVENOUS | Status: DC | PRN
  Administered 2021-11-11: 150 mg via INTRAVENOUS

## 2021-11-11 MED ORDER — PROMETHAZINE HCL 25 MG/ML IJ SOLN
6.2500 mg | INTRAMUSCULAR | Status: DC | PRN
  Administered 2021-11-11: 6.25 mg via INTRAVENOUS

## 2021-11-11 MED ORDER — SCOPOLAMINE 1 MG/3DAYS TD PT72: 1.0000 | MEDICATED_PATCH | Freq: Once | TRANSDERMAL | Status: DC

## 2021-11-11 MED ORDER — ROCURONIUM BROMIDE 10 MG/ML (PF) SYRINGE
PREFILLED_SYRINGE | INTRAVENOUS | Status: DC | PRN
  Administered 2021-11-11: 70 mg via INTRAVENOUS

## 2021-11-11 MED ORDER — 0.9 % SODIUM CHLORIDE (POUR BTL) OPTIME
TOPICAL | Status: DC | PRN
  Administered 2021-11-11: 1000 mL

## 2021-11-11 MED ORDER — BUPIVACAINE HCL 0.25 % IJ SOLN
INTRAMUSCULAR | Status: AC
  Filled 2021-11-11: qty 1

## 2021-11-11 MED ORDER — DROPERIDOL 2.5 MG/ML IJ SOLN
INTRAMUSCULAR | Status: DC | PRN
  Administered 2021-11-11: .625 mg via INTRAVENOUS

## 2021-11-11 MED ORDER — HYDROMORPHONE HCL 1 MG/ML IJ SOLN
0.2000 mg | INTRAMUSCULAR | Status: DC | PRN
  Administered 2021-11-12: 0.5 mg via INTRAVENOUS
  Filled 2021-11-11: qty 1

## 2021-11-11 MED ORDER — BUPIVACAINE HCL 0.25 % IJ SOLN
INTRAMUSCULAR | Status: DC | PRN
  Administered 2021-11-11: 50 mL

## 2021-11-11 MED ORDER — SENNOSIDES-DOCUSATE SODIUM 8.6-50 MG PO TABS
2.0000 | ORAL_TABLET | Freq: Every day | ORAL | Status: DC
  Administered 2021-11-11: 2 via ORAL
  Filled 2021-11-11: qty 2

## 2021-11-11 MED ORDER — DEXAMETHASONE SODIUM PHOSPHATE 4 MG/ML IJ SOLN
4.0000 mg | INTRAMUSCULAR | Status: AC
  Administered 2021-11-11: 8 mg via INTRAVENOUS

## 2021-11-11 MED ORDER — SUGAMMADEX SODIUM 200 MG/2ML IV SOLN
INTRAVENOUS | Status: DC | PRN
  Administered 2021-11-11: 110 mg via INTRAVENOUS

## 2021-11-11 MED ORDER — ACETIC ACID 5 % SOLN
Status: AC
  Filled 2021-11-11: qty 50

## 2021-11-11 MED ORDER — ORAL CARE MOUTH RINSE: 15.0000 mL | Freq: Once | OROMUCOSAL | Status: DC

## 2021-11-11 MED ORDER — HYDROMORPHONE HCL 1 MG/ML IJ SOLN
0.2500 mg | INTRAMUSCULAR | Status: DC | PRN
  Administered 2021-11-11: 0.25 mg via INTRAVENOUS
  Administered 2021-11-11: 0.5 mg via INTRAVENOUS

## 2021-11-11 MED ORDER — ONDANSETRON HCL 4 MG/2ML IJ SOLN: 4.0000 mg | Freq: Four times a day (QID) | INTRAMUSCULAR | Status: DC | PRN

## 2021-11-11 MED ORDER — DEXAMETHASONE SODIUM PHOSPHATE 4 MG/ML IJ SOLN: 4.0000 mg | INTRAMUSCULAR | Status: DC

## 2021-11-11 MED ORDER — ONDANSETRON HCL 4 MG/2ML IJ SOLN
INTRAMUSCULAR | Status: DC | PRN
  Administered 2021-11-11: 4 mg via INTRAVENOUS

## 2021-11-11 MED ORDER — ONDANSETRON HCL 4 MG/2ML IJ SOLN: 4.0000 mg | Freq: Once | INTRAMUSCULAR | Status: DC | PRN

## 2021-11-11 MED ORDER — OXYCODONE HCL 5 MG PO TABS: 5.0000 mg | ORAL_TABLET | Freq: Once | ORAL | Status: DC | PRN

## 2021-11-11 MED ORDER — HYDROMORPHONE HCL 2 MG/ML IJ SOLN
INTRAMUSCULAR | Status: AC
  Filled 2021-11-11: qty 1

## 2021-11-11 MED ORDER — FLUOROURACIL 5 % EX CREA: TOPICAL_CREAM | Freq: Two times a day (BID) | CUTANEOUS | Status: DC | PRN

## 2021-11-11 MED ORDER — MIDAZOLAM HCL 5 MG/5ML IJ SOLN
INTRAMUSCULAR | Status: DC | PRN
  Administered 2021-11-11 (×2): 1 mg via INTRAVENOUS

## 2021-11-11 MED ORDER — ONDANSETRON HCL 4 MG PO TABS: 4.0000 mg | ORAL_TABLET | Freq: Four times a day (QID) | ORAL | Status: DC | PRN

## 2021-11-11 MED ORDER — PHENYLEPHRINE HCL-NACL 20-0.9 MG/250ML-% IV SOLN
INTRAVENOUS | Status: AC
  Filled 2021-11-11: qty 250

## 2021-11-11 MED ORDER — LIDOCAINE HCL (PF) 1 % IJ SOLN
INTRAMUSCULAR | Status: AC
  Filled 2021-11-11: qty 30

## 2021-11-11 MED ORDER — IBUPROFEN 400 MG PO TABS
600.0000 mg | ORAL_TABLET | Freq: Three times a day (TID) | ORAL | Status: DC
  Administered 2021-11-11 – 2021-11-12 (×3): 600 mg via ORAL
  Filled 2021-11-11 (×3): qty 1

## 2021-11-11 MED ORDER — FENTANYL CITRATE (PF) 100 MCG/2ML IJ SOLN
INTRAMUSCULAR | Status: AC
  Filled 2021-11-11: qty 2

## 2021-11-11 MED ORDER — ACETAMINOPHEN 10 MG/ML IV SOLN: 1000.0000 mg | Freq: Once | INTRAVENOUS | Status: DC | PRN

## 2021-11-11 MED ORDER — KETAMINE HCL 50 MG/5ML IJ SOSY
PREFILLED_SYRINGE | INTRAMUSCULAR | Status: AC
  Filled 2021-11-11: qty 5

## 2021-11-11 MED ORDER — OXYCODONE HCL 5 MG PO TABS
5.0000 mg | ORAL_TABLET | ORAL | Status: DC | PRN
  Administered 2021-11-12 (×2): 10 mg via ORAL
  Filled 2021-11-11 (×2): qty 2

## 2021-11-11 MED ORDER — SCOPOLAMINE 1 MG/3DAYS TD PT72
MEDICATED_PATCH | TRANSDERMAL | Status: AC
  Administered 2021-11-11: 1.5 mg via TRANSDERMAL
  Filled 2021-11-11: qty 1

## 2021-11-11 SURGICAL SUPPLY — 86 items
APPLICATOR COTTON TIP 6 STRL (MISCELLANEOUS) IMPLANT
APPLICATOR COTTON TIP 6IN STRL (MISCELLANEOUS)
BACTOSHIELD CHG 4% 4OZ (MISCELLANEOUS)
BAG COUNTER SPONGE SURGICOUNT (BAG) ×1 IMPLANT
BLADE SURG 15 STRL LF DISP TIS (BLADE) ×2 IMPLANT
BLADE SURG 15 STRL SS (BLADE) ×1
BNDG GAUZE ELAST 4 BULKY (GAUZE/BANDAGES/DRESSINGS) ×2 IMPLANT
CHLORAPREP W/TINT 26 (MISCELLANEOUS) ×4 IMPLANT
CLIP TI LARGE 6 (CLIP) IMPLANT
CLIP TI MEDIUM 6 (CLIP) ×2 IMPLANT
CLIP TI MEDIUM LARGE 6 (CLIP) IMPLANT
CLOTH BEACON ORANGE TIMEOUT ST (SAFETY) ×3 IMPLANT
CONTAINER PREFILL 10% NBF 15ML (MISCELLANEOUS) ×4 IMPLANT
COVER SURGICAL LIGHT HANDLE (MISCELLANEOUS) ×3 IMPLANT
DERMABOND ADVANCED (GAUZE/BANDAGES/DRESSINGS) ×2
DERMABOND ADVANCED .7 DNX12 (GAUZE/BANDAGES/DRESSINGS) ×2 IMPLANT
DRAIN CHANNEL 10F 3/8 F FF (DRAIN) ×6 IMPLANT
DRAIN CHANNEL 19F RND (DRAIN) IMPLANT
DRAPE HYSTEROSCOPY (MISCELLANEOUS) ×3 IMPLANT
DRAPE SHEET LG 3/4 BI-LAMINATE (DRAPES) ×10 IMPLANT
DRAPE UTILITY XL STRL (DRAPES) ×3 IMPLANT
DRSG TEGADERM 4X4.75 (GAUZE/BANDAGES/DRESSINGS) ×10 IMPLANT
DRSG TELFA 3X8 NADH (GAUZE/BANDAGES/DRESSINGS) IMPLANT
ELECT REM PT RETURN 15FT ADLT (MISCELLANEOUS) ×3 IMPLANT
EVACUATOR SILICONE 100CC (DRAIN) ×6 IMPLANT
GAUZE 4X4 16PLY ~~LOC~~+RFID DBL (SPONGE) ×3 IMPLANT
GAUZE SPONGE 4X4 12PLY STRL (GAUZE/BANDAGES/DRESSINGS) ×3 IMPLANT
GLOVE BIO SURGEON STRL SZ 6 (GLOVE) ×4 IMPLANT
GLOVE BIO SURGEON STRL SZ 6.5 (GLOVE) ×4 IMPLANT
GLOVE BIOGEL PI IND STRL 6 (GLOVE) IMPLANT
GLOVE BIOGEL PI IND STRL 6.5 (GLOVE) IMPLANT
GLOVE BIOGEL PI INDICATOR 6 (GLOVE) ×7
GLOVE BIOGEL PI INDICATOR 6.5 (GLOVE) ×4
GOWN STRL REUS W/ TWL LRG LVL3 (GOWN DISPOSABLE) ×4 IMPLANT
GOWN STRL REUS W/TWL LRG LVL3 (GOWN DISPOSABLE) ×4
KIT BASIN OR (CUSTOM PROCEDURE TRAY) ×2 IMPLANT
KIT TURNOVER KIT A (KITS) ×1 IMPLANT
LEGGING LITHOTOMY PAIR STRL (DRAPES) ×3 IMPLANT
NDL HYPO 21X1.5 SAFETY (NEEDLE) ×2 IMPLANT
NDL SPNL 22GX7 QUINCKE BK (NEEDLE) IMPLANT
NEEDLE HYPO 21X1.5 SAFETY (NEEDLE) IMPLANT
NEEDLE HYPO 22GX1.5 SAFETY (NEEDLE) ×3 IMPLANT
NEEDLE SPNL 22GX7 QUINCKE BK (NEEDLE) IMPLANT
NS IRRIG 1000ML POUR BTL (IV SOLUTION) ×2 IMPLANT
PACK GENERAL/GYN (CUSTOM PROCEDURE TRAY) ×3 IMPLANT
PACK LITHOTOMY IV (CUSTOM PROCEDURE TRAY) ×3 IMPLANT
PACK VAGINAL MINOR WOMEN LF (CUSTOM PROCEDURE TRAY) ×3 IMPLANT
PAD DRESSING TELFA 3X8 NADH (GAUZE/BANDAGES/DRESSINGS) ×2 IMPLANT
PENCIL SMOKE EVACUATOR (MISCELLANEOUS) ×2 IMPLANT
SCOPETTES 8  STERILE (MISCELLANEOUS)
SCOPETTES 8 STERILE (MISCELLANEOUS) IMPLANT
SCRUB CHG 4% DYNA-HEX 4OZ (MISCELLANEOUS) ×2 IMPLANT
SCRUB TECHNI CARE 4 OZ NO DYE (MISCELLANEOUS) ×2 IMPLANT
SHEARS HARMONIC 9CM CVD (BLADE) ×3 IMPLANT
SOL PREP POV-IOD 4OZ 10% (MISCELLANEOUS) ×3 IMPLANT
SPIKE FLUID TRANSFER (MISCELLANEOUS) ×2 IMPLANT
SPONGE DRAIN TRACH 4X4 STRL 2S (GAUZE/BANDAGES/DRESSINGS) ×6 IMPLANT
SPONGE T-LAP 18X18 ~~LOC~~+RFID (SPONGE) ×1 IMPLANT
SURGILUBE 2OZ TUBE FLIPTOP (MISCELLANEOUS) ×2 IMPLANT
SUT ETHILON 3 0 PS 1 (SUTURE) ×6 IMPLANT
SUT MNCRL AB 4-0 PS2 18 (SUTURE) ×6 IMPLANT
SUT NYLON 3 0 (SUTURE) ×4 IMPLANT
SUT SILK 2 0 (SUTURE)
SUT SILK 2-0 18XBRD TIE 12 (SUTURE) ×2 IMPLANT
SUT SILK 3 0 (SUTURE)
SUT SILK 3-0 18XBRD TIE 12 (SUTURE) IMPLANT
SUT VIC AB 0 CT1 27 (SUTURE)
SUT VIC AB 0 CT1 27XBRD ANTBC (SUTURE) IMPLANT
SUT VIC AB 2-0 CT1 27 (SUTURE) ×4
SUT VIC AB 2-0 CT1 TAPERPNT 27 (SUTURE) IMPLANT
SUT VIC AB 2-0 SH 27 (SUTURE) ×2
SUT VIC AB 2-0 SH 27X BRD (SUTURE) IMPLANT
SUT VIC AB 3-0 CT1 36 (SUTURE) IMPLANT
SUT VIC AB 3-0 SH 18 (SUTURE) IMPLANT
SUT VIC AB 3-0 SH 27 (SUTURE) ×3
SUT VIC AB 3-0 SH 27X BRD (SUTURE) IMPLANT
SUT VIC AB 3-0 SH 27XBRD (SUTURE) IMPLANT
SUT VIC AB 4-0 PS2 27 (SUTURE) ×4 IMPLANT
SYR BULB IRRIG 60ML STRL (SYRINGE) ×2 IMPLANT
SYR CONTROL 10ML LL (SYRINGE) ×3 IMPLANT
TOWEL OR 17X26 10 PK STRL BLUE (TOWEL DISPOSABLE) ×6 IMPLANT
TOWEL OR NON WOVEN STRL DISP B (DISPOSABLE) ×3 IMPLANT
TRAY FOLEY MTR SLVR 16FR STAT (SET/KITS/TRAYS/PACK) ×1 IMPLANT
UNDERPAD 30X36 HEAVY ABSORB (UNDERPADS AND DIAPERS) ×4 IMPLANT
WATER STERILE IRR 1000ML POUR (IV SOLUTION) ×2 IMPLANT
YANKAUER SUCT BULB TIP 10FT TU (MISCELLANEOUS) ×1 IMPLANT

## 2021-11-11 NOTE — H&P (Signed)
Gynecologic Oncology History and Physical  11/11/21  Treatment History: The patient recently presented with a painful left vulvar lesion and associated discharge and bleeding.  This has increased in symptoms over the last 6 months.  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  Per her report, she was followed after and had laser treatment of what was likely precancer again.  Prior notes show that she was taken to the operating room on 07/04/2013 in the setting of VIN 3 for CO2 laser ablation.  She was then lost to follow-up after 2016.   She notes that about 6 months ago, she began to appreciate an area on her vulva that she thought initially was scar tissue.  Over the last 6 months, it has increased in size.  In January, she began to notice discharge as well as intermittent small amount of bleeding.  She also began to feel like there was a tear or ulcer at the top of the lesion which caused her pain.  She wears a panty liner or pad now all the time because of her discharge, which has caused some irritation to her vulva as well as groin area.  She waited to seek care given her husband's terminal cancer diagnosis, stating that she has put her own health needs on the back burner for the last couple of years.   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  Squamous epithelium is strong diffuse p16 positivity on IHC.   She endorses a good appetite without nausea or emesis.  She has lost about 3 pounds over the last few months and is not trying to lose weight.  After surgery for a partial small bowel obstruction in 2006, she has chronic intermittent diarrhea.  She denies any change to bowel function recently.  She has a little bit of burning on her vulva when she urinates, otherwise denies any urinary symptoms.   Patient underwent hysterectomy several decades ago.  Interval  History: Doing well. Has had some bleeding from her vulvar mass, thinks related to being up and moving more around funeral and preparations.  Past Medical/Surgical History: Past Medical History:  Diagnosis Date   Acute non-recurrent sinusitis 10/13/2018   Anemia    Asthma    simbicort, no rescue inhaler use   Basal cell carcinoma of skin    on forehead and temple, and left side of nose   Complication of anesthesia    Frequent sinus infections    Headache    Migraine    PONV (postoperative nausea and vomiting)    Small bowel obstruction ([place])    Surgery in 2006   Vulvar cancer [facility])     Past Surgical History:  Procedure Laterality Date   ABDOMINAL HYSTERECTOMY  1989   thinks had BSO   HEMORROIDECTOMY     NASAL SINUS SURGERY     14 surgeries   OTHER SURGICAL HISTORY     Bladder sling   SKIN BIOPSY     basal cell on face   SMALL INTESTINE SURGERY     Small bowel obstruction in 2006   TONSILLECTOMY     vulvectomy      Family History  Problem Relation Age of Onset   Stroke Father        Deceased   Colon cancer Father    Kidney disease Maternal Grandfather    Stroke  Paternal Grandmother    Renal Disease Paternal Grandfather    Heart failure Paternal Grandfather    Heart attack Paternal Grandfather    Pancreatic cancer Maternal Uncle    Breast cancer Niece    Migraines Neg Hx     Social History   Socioeconomic History   Marital status: Widowed    Spouse name: Not on file   Number of children: 2   Years of education: 2 years college   Highest education level: Not on file  Occupational History   Not on file  Tobacco Use   Smoking status: Every Day    Packs/day: 0.25    Types: Cigarettes   Smokeless tobacco: Never   Tobacco comments:    Patient has cut back "a lot" since her husband has been sick.  Vaping Use   Vaping Use: Never used  Substance and Sexual Activity   Alcohol use: Yes    Comment: daily   Drug use: No   Sexual activity: Yes     Partners: Male  Other Topics Concern   Not on file  Social History Narrative   Married.   Lives in [place] with her husband.   2 daughters, 4 grandchildren.   On disability from work.   Enjoys being outside, gardening, volunteers at [employer].   Caffeine: diet coke 16-24 oz per day   Social Determinants of Health   Financial Resource Strain: Not on file  Food Insecurity: Not on file  Transportation Needs: Not on file  Physical Activity: Not on file  Stress: Not on file  Social Connections: Not on file    Current Medications:  Current Facility-Administered Medications:    chlorhexidine (PERIDEX) 0.12 % solution 15 mL, 15 mL, Mouth/Throat, Once **OR** MEDLINE mouth rinse, 15 mL, Mouth Rinse, Once, Josephine Igo, MD   vancomycin (VANCOCIN) IVPB 1000 mg/200 mL premix, 1,000 mg, Intravenous, Once, Last Rate: 200 mL/hr at 11/11/21 1310, 1,000 mg at 11/11/21 1310 **AND** ciprofloxacin (CIPRO) IVPB 400 mg, 400 mg, Intravenous, On Call to OR, Cross, Melissa D, NP   dexamethasone (DECADRON) injection 4 mg, 4 mg, Intravenous, On Call to OR, Suzzanne Cloud, RPH   lactated ringers infusion, , Intravenous, Continuous, Josephine Igo, MD, Last Rate: 10 mL/hr at 11/11/21 1309, New Bag at 11/11/21 1309   scopolamine (TRANSDERM-SCOP) 1 MG/3DAYS 1.5 mg, 1 patch, Transdermal, Once, Myrtie Soman, MD, 1.5 mg at 11/11/21 1319  Review of Systems: + Bleeding, pelvic pain, vaginal discharge. Denies appetite changes, fevers, chills, fatigue, unexplained weight changes. Denies hearing loss, neck lumps or masses, mouth sores, ringing in ears or voice changes. Denies cough or wheezing.  Denies shortness of breath. Denies chest pain or palpitations. Denies leg swelling. Denies abdominal distention, pain, blood in stools, constipation, diarrhea, nausea, vomiting, or early satiety. Denies pain with intercourse, dysuria, frequency, or incontinence. Denies hot flashes.   Denies joint pain,  back pain or muscle pain/cramps. Denies itching, rash, or wounds. Denies dizziness, headaches, numbness or seizures. Denies swollen lymph nodes or glands, denies easy bruising or bleeding. Denies anxiety, depression, confusion, or decreased concentration.  Physical Exam: BP (!) 162/73   Pulse 77   Temp 98.5 F (36.9 C) (Oral)   Resp 16   Ht 5' (1.524 m)   Wt 110 lb (49.9 kg)   SpO2 100%   BMI 21.48 kg/m  Body mass index is 21.72 kg/m. General: Alert, oriented, no acute distress.  HEENT: Normocephalic, atraumatic. Sclera anicteric.  Chest: Clear to auscultation bilaterally. No wheezes,  rhonchi, or rales. Cardiovascular: Regular rate and rhythm, no murmurs, rubs, or gallops.  Abdomen: Normoactive bowel sounds. Soft, nondistended, nontender to palpation. No masses or hepatosplenomegaly appreciated. No palpable fluid wave.  Extremities: Grossly normal range of motion. Warm, well perfused. No edema bilaterally.  Skin: No rashes or lesions.  Lymphatics: No cervical, supraclavicular, or inguinal adenopathy.   Laboratory & Radiologic Studies: PET 10/16/21: IMPRESSION: 1. Hypermetabolic irregular soft tissue vulvar lesion corresponding with patient's given history of vulvar neoplasm.   2. No hypermetabolic metastatic disease in the neck, chest, abdomen or pelvis.   3. Tiny bilateral pulmonary nodules measure up to 4 mm, not hypermetabolic but below the resolution of PET-CT. Favored to reflect an infectious or inflammatory etiology. However, metastatic disease is not entirely excluded recommend short-term interval follow-up chest CT to ensure stability.  Assessment & Plan: Betty Hurst is a 66 y.o. 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  Plan for partial radical vulvectomy and bilateral inguinal LND given size of tumor.   Given LSIL, HR HPV positive pap, will plan for colposcopy and possible biopsies.   Jeral Pinch, MD   Division of Gynecologic Oncology  Department of Obstetrics and Gynecology  [facility]

## 2021-11-11 NOTE — Anesthesia Postprocedure Evaluation (Signed)
Anesthesia Post Note  Patient: Betty Hurst  Procedure(s) Performed: PARTIAL RADICAL VULVECTOMY (Vagina ) INGUINAL LYMPHADENECTOMY (Bilateral: Abdomen) COLPOSCOPY OF CERVIX WITH POSSIBLE BIOPSIES (Vagina )     Patient location during evaluation: PACU Anesthesia Type: General Level of consciousness: awake and alert Pain management: pain level controlled Vital Signs Assessment: post-procedure vital signs reviewed and stable Respiratory status: spontaneous breathing, nonlabored ventilation, respiratory function stable and patient connected to nasal cannula oxygen Cardiovascular status: blood pressure returned to baseline and stable Postop Assessment: no apparent nausea or vomiting Anesthetic complications: no   No notable events documented.  Last Vitals:  Vitals:   11/11/21 1845 11/11/21 1925  BP: (!) 175/81 (!) 175/77  Pulse: 92 99  Resp: 13 16  Temp: 36.4 C 36.9 C  SpO2: 98% 99%    Last Pain:  Vitals:   11/11/21 1925  TempSrc: Oral  PainSc:                  [name]

## 2021-11-11 NOTE — Anesthesia Preprocedure Evaluation (Signed)
Anesthesia Evaluation  Patient identified by MRN, date of birth, ID band Patient awake    Reviewed: Allergy & Precautions, NPO status , Patient's Chart, lab work & pertinent test results  History of Anesthesia Complications (+) PONV and history of anesthetic complications  Airway Mallampati: II  TM Distance: >3 FB Neck ROM: Full    Dental no notable dental hx.    Pulmonary COPD, Current Smoker,    Pulmonary exam normal breath sounds clear to auscultation       Cardiovascular negative cardio ROS Normal cardiovascular exam Rhythm:Regular Rate:Normal     Neuro/Psych negative neurological ROS  negative psych ROS   GI/Hepatic negative GI ROS, Neg liver ROS,   Endo/Other  negative endocrine ROS  Renal/GU negative Renal ROS  negative genitourinary   Musculoskeletal negative musculoskeletal ROS (+)   Abdominal   Peds negative pediatric ROS (+)  Hematology negative hematology ROS (+)   Anesthesia Other Findings   Reproductive/Obstetrics negative OB ROS                             Anesthesia Physical Anesthesia Plan  ASA: 3  Anesthesia Plan: General   Post-op Pain Management: Lidocaine infusion*   Induction: Intravenous  PONV Risk Score and Plan: 4 or greater and Ondansetron, Dexamethasone, Droperidol, Midazolam, Scopolamine patch - Pre-op and Treatment may vary due to age or medical condition  Airway Management Planned: Oral ETT  Additional Equipment:   Intra-op Plan:   Post-operative Plan: Extubation in OR  Informed Consent: I have reviewed the patients History and Physical, chart, labs and discussed the procedure including the risks, benefits and alternatives for the proposed anesthesia with the patient or authorized representative who has indicated his/her understanding and acceptance.     Dental advisory given  Plan Discussed with: CRNA and Surgeon  Anesthesia Plan  Comments:         Anesthesia Quick Evaluation

## 2021-11-11 NOTE — Transfer of Care (Signed)
Immediate Anesthesia Transfer of Care Note  Patient: Betty Hurst  Procedure(s) Performed: PARTIAL RADICAL VULVECTOMY (Vagina ) INGUINAL LYMPHADENECTOMY (Bilateral: Abdomen) COLPOSCOPY OF CERVIX WITH POSSIBLE BIOPSIES (Vagina )  Patient Location: PACU  Anesthesia Type:General  Level of Consciousness: awake, alert , oriented and patient cooperative  Airway & Oxygen Therapy: Patient Spontanous Breathing and Patient connected to face mask oxygen  Post-op Assessment: Report given to RN and Post -op Vital signs reviewed and stable  Post vital signs: Reviewed and stable  Last Vitals:  Vitals Value Taken Time  BP 161/94 11/11/21 1754  Temp 36.6 C 11/11/21 1754  Pulse 100 11/11/21 1801  Resp 20 11/11/21 1801  SpO2 100 % 11/11/21 1801  Vitals shown include unvalidated device data.  Last Pain:  Vitals:   11/11/21 1754  TempSrc:   PainSc: Asleep      Patients Stated Pain Goal: 4 (61/95/09 3267)  Complications: No notable events documented.

## 2021-11-11 NOTE — Op Note (Signed)
Operative Note  PATIENT: Betty Hurst DATE: 11/11/2021  Preop Diagnosis: At least stage Ib squamous cell carcinoma of the vulva  Postoperative Diagnosis: Same as above  Surgery: Bilateral inguinal lymph node dissection, partial radical vulvectomy  Surgeons: Jeral Pinch MD  Assistant: [name] NP  Anesthesia: General   Estimated blood loss: 400 ml  IVF: See I&O flowsheet  Urine output: 127 ml   Complications: None apparent   Pathology: Bilateral inguinal lymph nodes, left vulva with marking stitch at 12 o'clock  Operative findings: No palpable inguinal adenopathy.  6.5 x 5.5 cm fungating tumor replacing most of the left labia.  The tumor is approximately 2 cm from the urethra and just over 2 cm from the clitoris along the apical margin.  The tumor bed itself is friable and mostly exophytic.  There is approximately 1 cm of tumor that appears to extend below the skin, but the tumor itself is mobile.  During the inferior dissection for removal of the tumor, it was noted that the base of the tumor came in close proximity to the rectum.  The decision was made to take what is likely a very close but grossly negative margin along this aspect of the posterior resection to spare the rectum.  On inspection of the vagina, after application of Lugol's, there was mild decreased uptake along the mid to right portion of the vaginal cuff.  This was biopsied.  No other vaginal lesions or decreased uptake was noted.  Procedure: The patient was identified in the preoperative holding area. Informed consent was signed on the chart. Patient was seen history was reviewed and exam was performed.   The patient was then taken to the operating room and placed in the supine position with SCD hose on. General anesthesia was then induced without difficulty. She was then placed in the dorsolithotomy position. The perineum was prepped with Betadine. The vagina was prepped with Betadine. The  patient was then draped after the prep was dried. A Foley catheter was inserted into the bladder under sterile conditions.  Timeout was performed the patient, procedure, antibiotic, allergy, and length of procedure.   At this point the right inguino-femoral lymphadenectomy was performed. The bony landmarks of the pubic tubercle and the ASIS were identified and the palpable location of the femoral artery. A 8cm incision was made 1 fingerbreadth below the inguinal ligament on the right anterior thigh/groin parallel to the inguinal ligament. The camper's fascia was scored and the inferior and superior skin flaps were created with elevation of skin hooks and use of the bovie for dissection. The inguino-femoral lymph nodes were circumscribed in the operative bed. Lymph node tissue was grasped and using bovie dissection, the entire inguinofemoral bed was resected from its attachments to the level of the cribiform facia overlying the femoral vessels.  During this dissection, some perforating vessels were isolated, suture ligated with silk, and transected.  The boundaries of this dissection was the sartorius muscle laterally, the adductor longus tendon medially, the inguinal ligament superiorally, the cribiform fascia posteriorally. The great saphenous vein was identified and skeletonized during this dissection and spared. A stab wound was made with an 11 blade scalpel lateral and superior to the incision and a 68F JP drain was placed in the bed of the groin bed. It was secured with nylon. The camper's fascia was closed with running 3-0 vicryl overlying the drain.  The subcutaneous tissue was reapproximated with 4-0 Vicryl.  The skin was closed with running 4-0 monocryl  and dermabond.  A duplicate dissection was performed on the left and drain left. Closure was the same for the left.  Drain sponges were placed.  The lesion was identified and the marking pen was used to circumscribe the area with appropriate surgical  margins. The 15 blade scalpel was used to make an incision through the skin circumferentially as marked.  The combination of monopolar electrocautery and bipolar electrocautery using the hand-held harmonic was then used to dissect down to the endopelvic fascia.  Along the inferior aspect of the dissection, the tumor itself was noted to be in closer proximity to the rectovaginal septum and rectum.  Given this, the decision was made to take a closer margin along this aspect of the tumor so as not to injure the rectum.  Much of this part of the dissection was performed with 1 finger in the rectum.  Surgeons gloves were always changed after rectal exam was performed.  After the specimen had been completely resected, it was oriented and marked at 12 o'clock with a 0-vicryl suture. The bovie was used to obtain hemostasis at the surgical bed. The subcutaneous tissues were irrigated and made hemostatic.  There was some bleeding along the medial aspect within the bed of the dissection.  Several figure-of-eight stitches with 2-0 Vicryl was used to achieve hemostasis.  The bed was irrigated again.  The deep dermal layer was approximated with 2-0 vicryl mattress sutures to bring the skin edges into approximation and off tension.  The more superficial subcutaneous layer was reapproximated with 3-0 Vicryl to further take the skin edges off of tension.  The wound was closed following langher's lines. The cutaneous layer was closed with interrupted 4-0 vicryl stitches and mattress sutures to ensure a tension free and hemostatic closure. The perineum was again irrigated.  Approximately 50 cc of Exparel mixed with quarter percent Marcaine was then injected around the incision.  Vaginoscopy then performed with findings noted above.  Tischler forceps were used to take a biopsy of the vaginal cuff.  All instrument, suture, laparotomy, Ray-Tec, and needle counts were correct x2. The patient tolerated the procedure well and was  taken recovery room in stable condition.   Jeral Pinch MD Gynecologic Oncology

## 2021-11-11 NOTE — Anesthesia Procedure Notes (Signed)
Procedure Name: Intubation Date/Time: 11/11/2021 2:28 PM Performed by: Cleda Daub, CRNA Pre-anesthesia Checklist: Patient identified, Emergency Drugs available, Suction available and Patient being monitored Patient Re-evaluated:Patient Re-evaluated prior to induction Oxygen Delivery Method: Circle system utilized Preoxygenation: Pre-oxygenation with 100% oxygen Induction Type: IV induction Ventilation: Mask ventilation without difficulty Laryngoscope Size: Mac and 3 Grade View: Grade I Tube type: Oral Tube size: 7.0 mm Number of attempts: 1 Airway Equipment and Method: Stylet and Oral airway Placement Confirmation: ETT inserted through vocal cords under direct vision, positive ETCO2 and breath sounds checked- equal and bilateral Secured at: 21 cm Tube secured with: Tape Dental Injury: Teeth and Oropharynx as per pre-operative assessment

## 2021-11-11 NOTE — Discharge Instructions (Signed)
AFTER SURGERY INSTRUCTIONS   We recommend purchasing several bags of frozen green peas and dividing them into ziploc bags. You will want to keep these in the freezer and have them ready to use as ice packs to the vulvar incision. Once the ice pack is no longer cold, you can get another from the freezer. The frozen peas mold to your body better than a regular ice pack.    You will be discharged home with two drains in place (left, right groin). These will need to be emptied on a regular basis during the day and output recorded.   Return to work: 4-6 weeks if applicable   Activity: 1. Be up and out of the bed during the day.  Take a nap if needed.  You may walk up steps but be careful and use the hand rail.  Stair climbing will tire you more than you think, you may need to stop part way and rest.    2. No lifting or straining for 6 weeks over 10 pounds. No pushing, pulling, straining for 6 weeks.   3. No driving for around 1 week(s).  Do not drive if you are taking narcotic pain medicine and make sure that your reaction time has returned.    4. You can shower as soon as the next day after surgery. Shower daily.  Use your regular soap and water (not directly on the incision) and pat your incision(s) dry afterwards; don't rub.  No tub baths or submerging your body in water until cleared by your surgeon. If you have the soap that was given to you by pre-surgical testing that was used before surgery, you do not need to use it afterwards because this can irritate your incisions.    5. No sexual activity and nothing in the vagina for 6 weeks.   6. You may experience a small amount of clear drainage from your incisions, which is normal.  If the drainage persists, increases, or changes color please call the office.   7. Do not use creams, lotions, or ointments such as neosporin on your incisions after surgery until advised by your surgeon because they can cause removal of the dermabond glue on your  incisions.     8. You may experience vulvar spotting after surgery.  The spotting is normal but if you experience heavy bleeding, call our office.   9. Take Tylenol or ibuprofen first for pain if you are able to take these medications and only use narcotic pain medication for severe pain not relieved by the Tylenol or Ibuprofen.  Monitor your Tylenol intake to a max of 4,000 mg in a 24 hour period. You can alternate these medications after surgery.   Diet: 1. Low sodium Heart Healthy Diet is recommended but you are cleared to resume your normal (before surgery) diet after your procedure.   2. It is safe to use a laxative, such as Miralax or Colace, if you have difficulty moving your bowels. You have been prescribed Sennakot-S to take at bedtime every evening after surgery to keep bowel movements regular and to prevent constipation.     Wound Care: 1. Keep clean and dry.  Shower daily.   Reasons to call the Doctor: Fever - Oral temperature greater than 100.4 degrees Fahrenheit Foul-smelling vaginal discharge Difficulty urinating Nausea and vomiting Increased pain at the site of the incision that is unrelieved with pain medicine. Difficulty breathing with or without chest pain New calf pain especially if only on one side  Sudden, continuing increased vaginal bleeding with or without clots.   Contacts: For questions or concerns you should contact:   Dr. Jeral Pinch at [phone]   [name], NP at [phone]   After Hours: call [phone] and have the GYN Oncologist paged/contacted (after 5 pm or on the weekends).   Messages sent via mychart are for non-urgent matters and are not responded to after hours so for urgent needs, please call the after hours number.

## 2021-11-12 ENCOUNTER — Encounter (HOSPITAL_COMMUNITY): Payer: Self-pay | Admitting: Gynecologic Oncology

## 2021-11-12 ENCOUNTER — Other Ambulatory Visit: Payer: Self-pay

## 2021-11-12 LAB — CBC
HCT: 32.3 % — ABNORMAL LOW (ref 36.0–46.0)
Hemoglobin: 11 g/dL — ABNORMAL LOW (ref 12.0–15.0)
MCH: 40 pg — ABNORMAL HIGH (ref 26.0–34.0)
MCHC: 34.1 g/dL (ref 30.0–36.0)
MCV: 117.5 fL — ABNORMAL HIGH (ref 80.0–100.0)
Platelets: 134 10*3/uL — ABNORMAL LOW (ref 150–400)
RBC: 2.75 MIL/uL — ABNORMAL LOW (ref 3.87–5.11)
RDW: 11 % — ABNORMAL LOW (ref 11.5–15.5)
WBC: 7.8 10*3/uL (ref 4.0–10.5)
nRBC: 0 % (ref 0.0–0.2)

## 2021-11-12 LAB — BASIC METABOLIC PANEL
Anion gap: 6 (ref 5–15)
Anion gap: 8 (ref 5–15)
BUN: 12 mg/dL (ref 8–23)
BUN: 10 mg/dL (ref 8–23)
CO2: 27 mmol/L (ref 22–32)
CO2: 28 mmol/L (ref 22–32)
Calcium: 8.7 mg/dL — ABNORMAL LOW (ref 8.9–10.3)
Calcium: 8.5 mg/dL — ABNORMAL LOW (ref 8.9–10.3)
Chloride: 94 mmol/L — ABNORMAL LOW (ref 98–111)
Chloride: 96 mmol/L — ABNORMAL LOW (ref 98–111)
Creatinine, Ser: 0.72 mg/dL (ref 0.44–1.00)
Creatinine, Ser: 0.7 mg/dL (ref 0.44–1.00)
GFR, Estimated: >60 mL/min (ref >60)
GFR, Estimated: >60 mL/min (ref >60)
Glucose, Bld: 192 mg/dL — ABNORMAL HIGH (ref 70–99)
Glucose, Bld: 101 mg/dL — ABNORMAL HIGH (ref 70–99)
Potassium: 4.7 mmol/L (ref 3.5–5.1)
Potassium: 4.4 mmol/L (ref 3.5–5.1)
Sodium: 127 mmol/L — ABNORMAL LOW (ref 135–145)
Sodium: 132 mmol/L — ABNORMAL LOW (ref 135–145)

## 2021-11-12 MED ORDER — SENNOSIDES-DOCUSATE SODIUM 8.6-50 MG PO TABS: 2.0000 | ORAL_TABLET | Freq: Every day | ORAL | 1 refills | Status: DC

## 2021-11-12 MED ORDER — KETOROLAC TROMETHAMINE 0.5 % OP SOLN
1.0000 [drp] | Freq: Four times a day (QID) | OPHTHALMIC | Status: DC
  Administered 2021-11-12: 1 [drp] via OPHTHALMIC
  Filled 2021-11-12: qty 5

## 2021-11-12 MED ORDER — BSS IO SOLN
15.0000 mL | Freq: Once | INTRAOCULAR | Status: AC
  Administered 2021-11-12: 15 mL
  Filled 2021-11-12: qty 15

## 2021-11-12 MED ORDER — IBUPROFEN 800 MG PO TABS: 800.0000 mg | ORAL_TABLET | Freq: Three times a day (TID) | ORAL | 0 refills | Status: DC | PRN

## 2021-11-12 MED ORDER — DIPHENHYDRAMINE HCL 25 MG PO CAPS
25.0000 mg | ORAL_CAPSULE | Freq: Four times a day (QID) | ORAL | Status: DC | PRN
  Administered 2021-11-12: 25 mg via ORAL
  Filled 2021-11-12: qty 1

## 2021-11-12 MED ORDER — OXYCODONE HCL 5 MG PO TABS: 5.0000 mg | ORAL_TABLET | ORAL | 0 refills | Status: DC | PRN

## 2021-11-12 MED ORDER — SODIUM CHLORIDE 0.9 % IV SOLN: INTRAVENOUS | Status: DC

## 2021-11-12 NOTE — Progress Notes (Signed)
Transition of Care [facility]) Screening Note  Patient Details  Name: Betty Hurst Date of Birth: 02-20-1956  Transition of Care [facility]) CM/SW Contact:    Sherie Don, LCSW Phone Number: 11/12/2021, 10:24 AM  Transition of Care Department [facility]) has reviewed patient and no TOC needs have been identified at this time. We will continue to monitor patient advancement through interdisciplinary progression rounds. If new patient transition needs arise, please place a TOC consult.

## 2021-11-12 NOTE — Progress Notes (Signed)
1 Day Post-Op Procedure(s) (LRB): PARTIAL RADICAL VULVECTOMY (N/A) INGUINAL LYMPHADENECTOMY (Bilateral) COLPOSCOPY OF CERVIX WITH POSSIBLE BIOPSIES (N/A)  Subjective: Patient reports appropriate incisional soreness, more near the drains. She is tolerating her diet with no nausea or emesis reported. Passing flatus. No significant vaginal bleeding reported. Sister in law at the bedside. No concerns voiced.    Objective: Vital signs in last 24 hours: Temp:  [97.6 F (36.4 C)-98.7 F (37.1 C)] 98.4 F (36.9 C) (05/24 0905) Pulse Rate:  [77-108] 78 (05/24 0905) Resp:  [13-18] 18 (05/24 0905) BP: (116-197)/(54-94) 116/60 (05/24 0905) SpO2:  [96 %-100 %] 97 % (05/24 0905) Weight:  [110 lb (49.9 kg)] 110 lb (49.9 kg) (05/23 1235) Last BM Date : 11/10/21  Intake/Output from previous day: 05/23 0701 - 05/24 0700 In: 2499.3 [P.O.:480; I.V.:1819.3; IV Piggyback:200] Out: 3220 [Urine:1100; Drains:55; Blood:400]  Physical Examination: General: alert, cooperative, and no distress Resp: clear to auscultation bilaterally Cardio: regular rate and rhythm, S1, S2 normal, no murmur, click, rub or gallop GI: soft, non-tender; bowel sounds normal; no masses,  no organomegaly Extremities: extremities normal, atraumatic, no cyanosis or edema Bilateral inguinal incisions intact with no drainage. Drains stripped with serosanguinous output in both. Dressings around the drains intact. Peri pad over vulvar incision without significant bleeding noted.  Labs: WBC/Hgb/Hct/Plts:  7.8/11.0/32.3/134 (05/24 2542) BUN/Cr/glu/ALT/AST/amyl/lip:  12/0.72/--/--/--/--/-- (05/24 0415)  Assessment: 66 y.o. s/p Procedure(s): PARTIAL RADICAL VULVECTOMY, INGUINAL LYMPHADENECTOMY, COLPOSCOPY OF CERVIX WITH POSSIBLE BIOPSIES: stable Pain:  Pain is well-controlled on PRN medications.  Heme: Hgb 11.0 and Hct 32.3 this am. Appropriate compared with preop values and surgical losses.  CV: BP and HR stable. Continue to monitor  with ordered vital signs.  GI:  Tolerating po: Yes. Antiemetics ordered if needed.  GU: Foley in place with clear, yellow urine. Creatinine 0.72 this am. Plan for removal this am.    FEN: Na+ level at 127, mildly hyponatremic preop as well. Plan for IVF change and repeat Bmet later this am.  Prophylaxis: SCDs on, lovenox ordered.  Plan: IVF to NS Plan for repeat Bmet later today Foley removal If sodium improved, pain controlled, able to void, plan for discharge later today The patient is to be discharged to home when ready.   LOS: 1 day    Betty Hurst 11/12/2021, 9:31 AM

## 2021-11-12 NOTE — Discharge Summary (Signed)
Physician Discharge Summary  Patient ID: Betty Hurst MRN: [id number] DOB/AGE: 64-Feb-1957 64 y.o.  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    Hospital Course: On 11/11/2021, the patient underwent the following: Procedure(s): PARTIAL RADICAL VULVECTOMY, INGUINAL LYMPHADENECTOMY, COLPOSCOPY OF CERVIX WITH POSSIBLE BIOPSIES. The postoperative course was uneventful.  She was discharged to home on postoperative day 1 tolerating a regular diet, voiding, pain controlled, ambulating, passing flatus, instructed on JP drain care.   Consults: None  Significant Diagnostic Studies: Am labs  Treatments: surgery: see above  Discharge Exam: Blood pressure (!) 112/54, pulse 86, temperature 98 F (36.7 C), resp. rate 16, height 5' (1.524 m), weight 110 lb (49.9 kg), SpO2 99 %. General appearance: alert, cooperative, and no distress Resp: clear to auscultation bilaterally Cardio: regular rate and rhythm, S1, S2 normal, no murmur, click, rub or gallop GI: soft, non-tender; bowel sounds normal; no masses,  no organomegaly Extremities: extremities normal, atraumatic, no cyanosis or edema Incision/Wound: Bilateral inguinal groin incisions intact with dermabond, vulvar incision intact with peri pad-no significant drainage or bleeding noted. JP drains charged with dry dressings surrounding.   Disposition: Discharge disposition: 01-Home or Self Care       Discharge Instructions     Call MD for:  difficulty breathing, headache or visual disturbances   Complete by: As directed    Call MD for:  extreme fatigue   Complete by: As directed    Call MD for:  hives   Complete by: As directed    Call MD for:  persistant dizziness or light-headedness   Complete by: As directed    Call MD for:  persistant  nausea and vomiting   Complete by: As directed    Call MD for:  redness, tenderness, or signs of infection (pain, swelling, redness, odor or green/yellow discharge around incision site)   Complete by: As directed    Call MD for:  severe uncontrolled pain   Complete by: As directed    Call MD for:  temperature >100.4   Complete by: As directed    Diet - low sodium heart healthy   Complete by: As directed    Discharge wound care:   Complete by: As directed    Recharge the drains on a regular basis and keep them compressed so suction is being created. Keep a log of the output. Plan to change the dressings around the drains when soiled or wet, usually at least once a day   Driving Restrictions   Complete by: As directed    No driving for around 1 week(s).  Do not take narcotics and drive. You need to make sure your reaction time has returned.   Increase activity slowly   Complete by: As directed    Lifting restrictions   Complete by: As directed    No lifting greater than 10 lbs, pushing, pulling, straining for 4-6 weeks.   Sexual Activity Restrictions   Complete by: As directed    No sexual activity, nothing in the vagina, for 4 weeks.      Allergies as of 11/12/2021       Reactions   Influenza Vaccines Hives   Morphine Hives, Swelling   Amitriptyline Rash   Cefuroxime Axetil Rash        Medication List     TAKE  these medications    acetaminophen 500 MG tablet Commonly known as: TYLENOL Take 1,000 mg by mouth every 6 (six) hours as needed for headache.   Alka-Seltzer Plus Cold & Cough 10-21-08-325 MG Caps Generic drug: Phenyleph-CPM-DM-APAP Take 2 tablets by mouth every 8 (eight) hours as needed (cold/ allergies).   diphenhydramine-acetaminophen 25-500 MG Tabs tablet Commonly known as: TYLENOL PM Take 1 tablet by mouth at bedtime as needed.   fluorouracil 5 % cream Commonly known as: EFUDEX Apply topically 2 (two) times daily as needed (cancer  cell patches, dry  skin).   ibuprofen 800 MG tablet Commonly known as: ADVIL Take 1 tablet (800 mg total) by mouth every 8 (eight) hours as needed for moderate pain. For AFTER surgery   oxyCODONE 5 MG immediate release tablet Commonly known as: Oxy IR/ROXICODONE Take 1-2 tablets (5-10 mg total) by mouth every 4 (four) hours as needed for moderate pain or severe pain. For AFTER surgery, do not take and drive   senna-docusate 8.6-50 MG tablet Commonly known as: Senokot-S Take 2 tablets by mouth at bedtime. For AFTER surgery, do not take if having diarrhea   Symbicort 160-4.5 MCG/ACT inhaler Generic drug: budesonide-formoterol INHALE 2 INHALATIONS BY  MOUTH TWICE DAILY What changed: See the new instructions.               Discharge Care Instructions  (From admission, onward)           Start     Ordered   11/12/21 0000  Discharge wound care:       Comments: Recharge the drains on a regular basis and keep them compressed so suction is being created. Keep a log of the output. Plan to change the dressings around the drains when soiled or wet, usually at least once a day   11/12/21 1357            Follow-up Information     Lafonda Mosses, MD Follow up on 11/19/2021.   Specialty: Gynecologic Oncology Why: at 4pm will be a PHONE visit with Dr. Berline Lopes to check in and discuss pathology. IN PERSON visit will be on 12/11/21 at 2pm at the [facility]. Contact information: [place] [address] [phone]         [name] D, NP Follow up on 11/27/2021.   Specialty: Gynecologic Oncology Why: at 2pm at the [facility] for drain check and incision check. Contact information: [address] [place] [address] [phone]                 Greater than thirty minutes were spend for face to face discharge instructions and discharge orders/summary in EPIC.   Signed: Dorothyann Gibbs 11/14/2021, 5:04 PM

## 2021-11-12 NOTE — Addendum Note (Signed)
Addendum  created 11/12/21 0926 by Lyn Hollingshead, MD   Order list changed, Pharmacy for encounter modified

## 2021-11-13 ENCOUNTER — Telehealth: Payer: Self-pay

## 2021-11-13 NOTE — Telephone Encounter (Signed)
Attempted to reach patient to check in with her post-operatively. Unable to reach patient. Left message requesting return call.   

## 2021-11-13 NOTE — Telephone Encounter (Signed)
Spoke with [name] this afternoon. She states she is eating, drinking and urinating well. She had a BM last night that was on the loose side so she is holding the senokot. She reports having some chills last night, she checked her temp which was normal. Incisions are dry and intact. She has some vulvar spotting. She was wearing a disposable diaper yesterday and pad today with only spots on it. She is going to try to wear her regular underwear. Advised that she may still have some spotting and this could get on her underwear. Instructed to avoid tight clothing and wear something loose fitting.  She rates her pain 7/10. Her pain is controlled with Ibuprofen Q6H and oxycodone as needed. Advised she can alternate taking tylenol and ibuprofen being careful not to exceed 4,'000mg'$  of tylenol in a 24 hour period. She has been applying ice packs to the vulva as needed.   She has been stripping and emptying her drains throughout the day and keeping a log of all outputs.   Instructed to call office with any fever, chills, purulent drainage, uncontrolled pain or any other questions or concerns. Patient verbalizes understanding.   Pt aware of post op appointments as well as the office number [phone] and after hours number [phone] to call if she has any questions or concerns

## 2021-11-14 LAB — SURGICAL PATHOLOGY

## 2021-11-19 ENCOUNTER — Inpatient Hospital Stay (HOSPITAL_BASED_OUTPATIENT_CLINIC_OR_DEPARTMENT_OTHER): Payer: Medicare Other | Admitting: Gynecologic Oncology

## 2021-11-19 DIAGNOSIS — Z7189 Other specified counseling: Secondary | ICD-10-CM

## 2021-11-19 DIAGNOSIS — Z9079 Acquired absence of other genital organ(s): Secondary | ICD-10-CM

## 2021-11-19 DIAGNOSIS — C519 Malignant neoplasm of vulva, unspecified: Secondary | ICD-10-CM

## 2021-11-19 NOTE — Progress Notes (Unsigned)
Gynecologic Oncology Telehealth Note: Gyn-Onc  I connected with Betty Hurst on 11/20/21 at  4:00 PM EDT by telephone and verified that I am speaking with the correct person using two identifiers.  I discussed the limitations, risks, security and privacy concerns of performing an evaluation and management service by telemedicine and the availability of in-person appointments. I also discussed with the patient that there may be a patient responsible charge related to this service. The patient expressed understanding and agreed to proceed.  Other persons participating in the visit and their role in the encounter: none.  Patient's location: home Provider's location: [facility]  Reason for Visit: Follow-up after surgery, treatment discussion  Treatment History: 11/11/21: Bilateral inguinal lymph node dissection, partial radical vulvectomy  Interval History: Having some trouble sleeping at night. Drains putting out less, 10cc each today. Vulva sore, improving daily. Still wearing a pad due to discharge (light brown, no pus), no blood.  Having a little swelling in legs, comes and goes. Bowel function back to baseline. Urinating without difficulty.   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[facility])     Past Surgical History:  Procedure Laterality Date   ABDOMINAL HYSTERECTOMY  1989   thinks had BSO   COLPOSCOPY N/A 11/11/2021   Procedure: COLPOSCOPY OF CERVIX WITH POSSIBLE BIOPSIES;  Surgeon: Lafonda Mosses, MD;  Location: [facility];  Service: Gynecology;  Laterality: N/A;   HEMORROIDECTOMY      INGUINAL LYMPHADENECTOMY Bilateral 11/11/2021   Procedure: INGUINAL LYMPHADENECTOMY;  Surgeon: Lafonda Mosses, MD;  Location: [facility];  Service: Gynecology;  Laterality: Bilateral;   NASAL SINUS SURGERY     14 surgeries   OTHER SURGICAL HISTORY     Bladder sling   RADICAL VULVECTOMY N/A 11/11/2021   Procedure: PARTIAL RADICAL VULVECTOMY;  Surgeon: Lafonda Mosses, MD;  Location: [facility];  Service: Gynecology;  Laterality: N/A;   SKIN BIOPSY     basal cell on face   SMALL INTESTINE SURGERY     Small bowel obstruction in 2006   TONSILLECTOMY     vulvectomy      Family History  Problem Relation Age of Onset   Stroke Father        Deceased   Colon cancer Father    Kidney disease Maternal Grandfather    Stroke Paternal Grandmother    Renal Disease Paternal Grandfather    Heart failure Paternal Grandfather    Heart attack Paternal Grandfather    Pancreatic cancer Maternal Uncle    Breast cancer Niece    Migraines Neg Hx     Social History   Socioeconomic History   Marital status: Widowed    Spouse name: Not on file   Number of children: 2   Years of education: 2 years college   Highest education level: Not on file  Occupational History   Not on file  Tobacco Use   Smoking status: Every Day    Packs/day: 0.25    Types: Cigarettes  Smokeless tobacco: Never   Tobacco comments:    Patient has cut back "a lot" since her husband has been sick.  Vaping Use   Vaping Use: Never used  Substance and Sexual Activity   Alcohol use: Yes    Comment: daily   Drug use: No   Sexual activity: Yes    Partners: Male  Other Topics Concern   Not on file  Social History Narrative   Married.   Lives in [place] with her husband.   2 daughters, 4 grandchildren.   On disability from work.   Enjoys being outside, gardening, volunteers at [employer].   Caffeine: diet coke 16-24 oz per day   Social Determinants of Health   Financial Resource Strain: Not on  file  Food Insecurity: Not on file  Transportation Needs: Not on file  Physical Activity: Not on file  Stress: Not on file  Social Connections: Not on file    Current Medications:  Current Outpatient Medications:    acetaminophen (TYLENOL) 500 MG tablet, Take 1,000 mg by mouth every 6 (six) hours as needed for headache., Disp: , Rfl:    budesonide-formoterol (SYMBICORT) 160-4.5 MCG/ACT inhaler, INHALE 2 INHALATIONS BY  MOUTH TWICE DAILY (Patient taking differently: Inhale 2 puffs into the lungs 2 (two) times daily.), Disp: 30.6 g, Rfl: 1   diphenhydramine-acetaminophen (TYLENOL PM) 25-500 MG TABS tablet, Take 1 tablet by mouth at bedtime as needed., Disp: , Rfl:    fluorouracil (EFUDEX) 5 % cream, Apply topically 2 (two) times daily as needed (cancer  cell patches, dry skin)., Disp: , Rfl:    ibuprofen (ADVIL) 800 MG tablet, Take 1 tablet (800 mg total) by mouth every 8 (eight) hours as needed for moderate pain. For AFTER surgery, Disp: 30 tablet, Rfl: 0   oxyCODONE (OXY IR/ROXICODONE) 5 MG immediate release tablet, Take 1-2 tablets (5-10 mg total) by mouth every 4 (four) hours as needed for moderate pain or severe pain. For AFTER surgery, do not take and drive, Disp: 15 tablet, Rfl: 0   Phenyleph-CPM-DM-APAP (ALKA-SELTZER PLUS COLD & COUGH) 10-21-08-325 MG CAPS, Take 2 tablets by mouth every 8 (eight) hours as needed (cold/ allergies)., Disp: , Rfl:    senna-docusate (SENOKOT-S) 8.6-50 MG tablet, Take 2 tablets by mouth at bedtime. For AFTER surgery, do not take if having diarrhea, Disp: 30 tablet, Rfl: 1  Review of Symptoms: Pertinent positives as per HPI.  Physical Exam: There were no vitals taken for this visit. Deferred given limitations of televisit.  Laboratory & Radiologic Studies: A. LYMPH NODE, LEFT INGUINAL, EXCISION:  Seven benign lymph nodes, negative for carcinoma (0/7)   B. LYMPH NODE, RIGHT INGUINAL, EXCISION:  Four benign lymph nodes, negative for carcinoma (0/4)   C.  LEFT VULVA, VULVECTOMY:  Invasive well to moderately differentiated squamous cell carcinoma  Tumor measures 6.0 x 5.0 x 2.0 cm with a depth of invasion of 14 mm  (pT1b)  Margins free (closest margins: 3 mm from 7:00 margin and 1.9 mm from  deep margin)   D. VAGINA, CUFF, BIOPSY:  Mild squamous dysplasia (LSIL, VAIN 1)   Assessment & Plan: Betty Hurst is a 66 y.o. 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  Patient appears to be doing well after surgery, meeting postoperative milestones.  Discussed continued close monitoring of her drains output.  Discussed precautions in terms of her incisions.  Reviewed her final pathology from surgery in detail.  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  I am somewhat surprised that resection margins were negative, but several are very close.  Given this as well as the size of her tumor, we will discuss the benefits of postoperative radiation given her risk factors at our next tumor board.  I discussed the assessment and treatment plan with the patient. The patient was provided with an opportunity to ask questions and all were answered. The patient agreed with the plan and demonstrated an understanding of the instructions.   The patient was advised to call back or see an in-person evaluation if the symptoms worsen or if the condition fails to improve as anticipated.   14 minutes of total time was spent for this patient encounter, including preparation, phone counseling with the patient and coordination of care, and documentation of the encounter.   Jeral Pinch, MD  Division of Gynecologic Oncology  Department of Obstetrics and Gynecology  [facility]

## 2021-11-20 ENCOUNTER — Encounter: Payer: Self-pay | Admitting: Gynecologic Oncology

## 2021-11-21 ENCOUNTER — Telehealth: Payer: Self-pay | Admitting: *Deleted

## 2021-11-21 ENCOUNTER — Encounter: Payer: Self-pay | Admitting: Gynecologic Oncology

## 2021-11-21 NOTE — Telephone Encounter (Signed)
Attempted to call pt this morning to check in on her and see how her drains are draining. Unable to reach pt and unable to leave a voice message. Also unable to reach pt's daughter. LVM on daughter's phone for a return call or for pt to call us back.

## 2021-11-21 NOTE — Telephone Encounter (Signed)
Spoke with pt this afternoon who stated that her drains are doing well. She stated during the day it drains about 5-10 cc, and yesterday from 8pm-12am it drained 20cc then from 12am-1030 it drained 30 cc from each drains. She stated it drains the most at night. The drainage is clear in color and is odorless. She says it is improving. She endorses some spotty light brown discharges. She still experiences a pain of 7/10 which she takes tylenol and oxy for as needed. She denies any fevers or chills.  [name], NP aware.

## 2021-11-23 ENCOUNTER — Emergency Department (HOSPITAL_COMMUNITY)
Admission: EM | Admit: 2021-11-23 | Discharge: 2021-11-24 | Disposition: A | Discharge status: Home / Self Care | Payer: Medicare Other | Attending: Emergency Medicine | Admitting: Emergency Medicine

## 2021-11-23 ENCOUNTER — Other Ambulatory Visit: Payer: Self-pay

## 2021-11-23 ENCOUNTER — Encounter (HOSPITAL_COMMUNITY): Payer: Self-pay

## 2021-11-23 DIAGNOSIS — E871 Hypo-osmolality and hyponatremia: Secondary | ICD-10-CM | POA: Diagnosis not present

## 2021-11-23 DIAGNOSIS — T888XXA Other specified complications of surgical and medical care, not elsewhere classified, initial encounter: Secondary | ICD-10-CM | POA: Diagnosis not present

## 2021-11-23 DIAGNOSIS — D72829 Elevated white blood cell count, unspecified: Secondary | ICD-10-CM | POA: Diagnosis not present

## 2021-11-23 DIAGNOSIS — R110 Nausea: Secondary | ICD-10-CM | POA: Insufficient documentation

## 2021-11-23 DIAGNOSIS — K3189 Other diseases of stomach and duodenum: Secondary | ICD-10-CM | POA: Diagnosis not present

## 2021-11-23 DIAGNOSIS — R509 Fever, unspecified: Secondary | ICD-10-CM | POA: Diagnosis not present

## 2021-11-23 DIAGNOSIS — Z9889 Other specified postprocedural states: Secondary | ICD-10-CM | POA: Diagnosis not present

## 2021-11-23 LAB — URINALYSIS, ROUTINE W REFLEX MICROSCOPIC
Bacteria, UA: NONE SEEN
Bacteria, UA: NONE SEEN
Bilirubin Urine: NEGATIVE
Bilirubin Urine: NEGATIVE
Glucose, UA: NEGATIVE mg/dL
Glucose, UA: NEGATIVE mg/dL
Hgb urine dipstick: NEGATIVE
Ketones, ur: NEGATIVE mg/dL
Ketones, ur: NEGATIVE mg/dL
Nitrite: NEGATIVE
Nitrite: NEGATIVE
Protein, ur: >=300 mg/dL — AB
Protein, ur: NEGATIVE mg/dL
Specific Gravity, Urine: 1.022 (ref 1.005–1.030)
Specific Gravity, Urine: 1.014 (ref 1.005–1.030)
pH: 7 (ref 5.0–8.0)
pH: 5 (ref 5.0–8.0)

## 2021-11-23 LAB — CBC WITH DIFFERENTIAL/PLATELET
Abs Immature Granulocytes: 0.07 10*3/uL (ref 0.00–0.07)
Basophils Absolute: 0.1 10*3/uL (ref 0.0–0.1)
Basophils Relative: 0 %
Eosinophils Absolute: 0.1 10*3/uL (ref 0.0–0.5)
Eosinophils Relative: 1 %
HCT: 36.3 % (ref 36.0–46.0)
Hemoglobin: 12.3 g/dL (ref 12.0–15.0)
Immature Granulocytes: 1 %
Lymphocytes Relative: 5 %
Lymphs Abs: 0.7 10*3/uL (ref 0.7–4.0)
MCH: 39.3 pg — ABNORMAL HIGH (ref 26.0–34.0)
MCHC: 33.9 g/dL (ref 30.0–36.0)
MCV: 116 fL — ABNORMAL HIGH (ref 80.0–100.0)
Monocytes Absolute: 0.8 10*3/uL (ref 0.1–1.0)
Monocytes Relative: 5 %
Neutro Abs: 13.1 10*3/uL — ABNORMAL HIGH (ref 1.7–7.7)
Neutrophils Relative %: 88 %
Platelets: 187 10*3/uL (ref 150–400)
RBC: 3.13 MIL/uL — ABNORMAL LOW (ref 3.87–5.11)
RDW: 11.4 % — ABNORMAL LOW (ref 11.5–15.5)
WBC: 14.7 10*3/uL — ABNORMAL HIGH (ref 4.0–10.5)
nRBC: 0 % (ref 0.0–0.2)

## 2021-11-23 LAB — COMPREHENSIVE METABOLIC PANEL
ALT: 13 U/L (ref 0–44)
AST: 17 U/L (ref 15–41)
Albumin: 3.5 g/dL (ref 3.5–5.0)
Alkaline Phosphatase: 46 U/L (ref 38–126)
Anion gap: 8 (ref 5–15)
BUN: 14 mg/dL (ref 8–23)
CO2: 23 mmol/L (ref 22–32)
Calcium: 8.9 mg/dL (ref 8.9–10.3)
Chloride: 100 mmol/L (ref 98–111)
Creatinine, Ser: 0.78 mg/dL (ref 0.44–1.00)
GFR, Estimated: >60 mL/min (ref >60)
Glucose, Bld: 115 mg/dL — ABNORMAL HIGH (ref 70–99)
Potassium: 4.9 mmol/L (ref 3.5–5.1)
Sodium: 131 mmol/L — ABNORMAL LOW (ref 135–145)
Total Bilirubin: 0.4 mg/dL (ref 0.3–1.2)
Total Protein: 6.8 g/dL (ref 6.5–8.1)

## 2021-11-23 LAB — LACTIC ACID, PLASMA: Lactic Acid, Venous: 0.8 mmol/L (ref 0.5–1.9)

## 2021-11-23 NOTE — ED Notes (Signed)
Urine has been collected and sent to lab.

## 2021-11-23 NOTE — ED Triage Notes (Signed)
Pt presents to ED from home with c/o fever, chills, nausea, and not sleeping well. Pt had procedure on 05/23, and states today her draining became red tinged like it was after surgery. Pt took tylenol around aprox 1930.

## 2021-11-24 ENCOUNTER — Emergency Department (HOSPITAL_BASED_OUTPATIENT_CLINIC_OR_DEPARTMENT_OTHER): Payer: Medicare Other

## 2021-11-24 ENCOUNTER — Emergency Department (HOSPITAL_COMMUNITY): Payer: Medicare Other

## 2021-11-24 DIAGNOSIS — R509 Fever, unspecified: Secondary | ICD-10-CM

## 2021-11-24 DIAGNOSIS — K3189 Other diseases of stomach and duodenum: Secondary | ICD-10-CM | POA: Diagnosis not present

## 2021-11-24 DIAGNOSIS — Z9889 Other specified postprocedural states: Secondary | ICD-10-CM | POA: Diagnosis not present

## 2021-11-24 MED ORDER — DOXYCYCLINE HYCLATE 100 MG PO CAPS: 100.0000 mg | ORAL_CAPSULE | Freq: Two times a day (BID) | ORAL | 0 refills | Status: DC

## 2021-11-24 MED ORDER — ACETAMINOPHEN 325 MG PO TABS
650.0000 mg | ORAL_TABLET | Freq: Once | ORAL | Status: AC
  Administered 2021-11-24: 650 mg via ORAL
  Filled 2021-11-24: qty 2

## 2021-11-24 MED ORDER — IOHEXOL 300 MG/ML  SOLN
100.0000 mL | Freq: Once | INTRAMUSCULAR | Status: AC | PRN
  Administered 2021-11-24: 100 mL via INTRAVENOUS

## 2021-11-24 NOTE — ED Provider Notes (Signed)
Vascular tech notify the results of venous Doppler study of bilateral lower extremities negative for acute DVT.  At this time patient is stable for discharge.  Review and interpreted imaging and agrees with radiologist interpretation.  BP 136/72   Pulse 84   Temp 98.7 F (37.1 C) (Oral)   Resp 17   Ht 5' (1.524 m)   Wt 49.9 kg   SpO2 97%   BMI 21.48 kg/m   Results for orders placed or performed during the hospital encounter of 11/23/21  Lactic acid, plasma  Result Value Ref Range   Lactic Acid, Venous 0.8 0.5 - 1.9 mmol/L  Comprehensive metabolic panel  Result Value Ref Range   Sodium 131 (L) 135 - 145 mmol/L   Potassium 4.9 3.5 - 5.1 mmol/L   Chloride 100 98 - 111 mmol/L   CO2 23 22 - 32 mmol/L   Glucose, Bld 115 (H) 70 - 99 mg/dL   BUN 14 8 - 23 mg/dL   Creatinine, Ser 0.78 0.44 - 1.00 mg/dL   Calcium 8.9 8.9 - 10.3 mg/dL   Total Protein 6.8 6.5 - 8.1 g/dL   Albumin 3.5 3.5 - 5.0 g/dL   AST 17 15 - 41 U/L   ALT 13 0 - 44 U/L   Alkaline Phosphatase 46 38 - 126 U/L   Total Bilirubin 0.4 0.3 - 1.2 mg/dL   GFR, Estimated >60 >60 mL/min   Anion gap 8 5 - 15  CBC with Differential  Result Value Ref Range   WBC 14.7 (H) 4.0 - 10.5 K/uL   RBC 3.13 (L) 3.87 - 5.11 MIL/uL   Hemoglobin 12.3 12.0 - 15.0 g/dL   HCT 36.3 36.0 - 46.0 %   MCV 116.0 (H) 80.0 - 100.0 fL   MCH 39.3 (H) 26.0 - 34.0 pg   MCHC 33.9 30.0 - 36.0 g/dL   RDW 11.4 (L) 11.5 - 15.5 %   Platelets 187 150 - 400 K/uL   nRBC 0.0 0.0 - 0.2 %   Neutrophils Relative % 88 %   Neutro Abs 13.1 (H) 1.7 - 7.7 K/uL   Lymphocytes Relative 5 %   Lymphs Abs 0.7 0.7 - 4.0 K/uL   Monocytes Relative 5 %   Monocytes Absolute 0.8 0.1 - 1.0 K/uL   Eosinophils Relative 1 %   Eosinophils Absolute 0.1 0.0 - 0.5 K/uL   Basophils Relative 0 %   Basophils Absolute 0.1 0.0 - 0.1 K/uL   Immature Granulocytes 1 %   Abs Immature Granulocytes 0.07 0.00 - 0.07 K/uL  Urinalysis, Routine w reflex microscopic Urine, Clean Catch  Result  Value Ref Range   Color, Urine YELLOW YELLOW   APPearance TURBID (A) CLEAR   Specific Gravity, Urine 1.022 1.005 - 1.030   pH 7.0 5.0 - 8.0   Glucose, UA NEGATIVE NEGATIVE mg/dL   Hgb urine dipstick MODERATE (A) NEGATIVE   Bilirubin Urine NEGATIVE NEGATIVE   Ketones, ur NEGATIVE NEGATIVE mg/dL   Protein, ur >=300 (A) NEGATIVE mg/dL   Nitrite NEGATIVE NEGATIVE   Leukocytes,Ua MODERATE (A) NEGATIVE   RBC / HPF 21-50 0 - 5 RBC/hpf   WBC, UA 21-50 0 - 5 WBC/hpf   Bacteria, UA NONE SEEN NONE SEEN   Squamous Epithelial / LPF 0-5 0 - 5   WBC Clumps PRESENT   Urinalysis, Routine w reflex microscopic Urine, Clean Catch  Result Value Ref Range   Color, Urine STRAW (A) YELLOW   APPearance CLEAR CLEAR   Specific  Gravity, Urine 1.014 1.005 - 1.030   pH 5.0 5.0 - 8.0   Glucose, UA NEGATIVE NEGATIVE mg/dL   Hgb urine dipstick NEGATIVE NEGATIVE   Bilirubin Urine NEGATIVE NEGATIVE   Ketones, ur NEGATIVE NEGATIVE mg/dL   Protein, ur NEGATIVE NEGATIVE mg/dL   Nitrite NEGATIVE NEGATIVE   Leukocytes,Ua MODERATE (A) NEGATIVE   RBC / HPF 0-5 0 - 5 RBC/hpf   WBC, UA 6-10 0 - 5 WBC/hpf   Bacteria, UA NONE SEEN NONE SEEN   CT ABDOMEN PELVIS W CONTRAST  Result Date: 11/24/2021 CLINICAL DATA:  66 year old female with recent 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 Purulent discharge from postoperative drain. EXAM: CT ABDOMEN AND PELVIS WITH CONTRAST TECHNIQUE: Multidetector CT imaging of the abdomen and pelvis was performed using the standard protocol following bolus administration of intravenous contrast. RADIATION DOSE REDUCTION: This exam was performed according to the departmental dose-optimization program which includes automated exposure control, adjustment of the mA and/or kV according to patient size and/or use of iterative reconstruction technique. CONTRAST:  162m OMNIPAQUE IOHEXOL 300 MG/ML  SOLN COMPARISON:  [facility] FINDINGS: Lower chest: No cardiomegaly, pericardial effusion, pleural effusion. Mild lung base scarring, including in the right middle lobe, appears postinflammatory. Hepatobiliary: No suspicious liver mass. There are 2 tiny low-density areas in the liver parenchyma which are too small to characterize (e.g. Series 2, image 20) but probably benign cysts. Negative gallbladder. Pancreas: Negative. Spleen: Negative. Adrenals/Urinary Tract: Normal adrenal glands. Symmetric renal enhancement and contrast excretion. Numerous pelvic phleboliths. Unremarkable urinary bladder. Stomach/Bowel: Rectum and anal verge appear within normal limits. Retained gas and stool throughout the colon. Redundant transverse colon. Evidence of prior appendectomy or partial right colectomy. Unclear whether there is a neo terminal ileum. No large bowel inflammation identified. No dilated small bowel. Decompressed stomach and duodenum. No free air or free fluid. Vascular/Lymphatic: Aortoiliac calcified atherosclerosis. Major arterial structures in the abdomen and pelvis are patent. Bilateral inguinal region percutaneous operative drains are further detailed below. No malignant lymphadenopathy is identified. Portal venous system appears patent. Reproductive: Labia postoperative changes. On the left a small volume of soft tissue gas tracks toward the pelvic floor (series 2, image 76), but is not unexpected following surgery. No regional adverse features identified. Chronically absent uterus.  Diminutive or absent ovaries. Other: No pelvic free fluid. There are bilateral inguinal percutaneous surgical drains in place. There is regional subcutaneous inflammatory stranding, including throughout the ventral lower abdominal wall. But no soft tissue gas or fluid collection. No drainable fluid identified along the course of either drain. Musculoskeletal: Lumbar spine degeneration and scoliosis. No acute or suspicious osseous lesion  identified. IMPRESSION: 1. Postoperative changes to the labia and bilateral inguinal percutaneous surgical drains in place. But no unexpected postoperative findings. No fluid collection or other adverse features identified. 2. No superimposed acute or metastatic process identified in the abdomen or pelvis. 3. Aortic Atherosclerosis (ICD10-I70.0). Electronically Signed   By: HGenevie AnnM.D.   On: 11/24/2021 05:29       TDomenic Moras PA-C 11/24/21 [id number]   CLennice Sites DO 11/24/21 1053

## 2021-11-24 NOTE — Progress Notes (Addendum)
GYN Oncology Progress Note  Betty Hurst is a 66 year old female s/p bilateral inguinal lymph node dissection, partial radical vulvectomy with Dr. Jeral Pinch on 11/11/21. 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   Starting Sunday June 4, she states she was not feeling well. She laid down to take a nap around 3:30 pm and woke up having chills. She checked her temperature with a reading of 101.9. She also noticed dark drainage with debris coming for the left JP drain in the inguinal region which prompted her to come to the ER.    Prior to this, she states she had been doing well. She was tolerating her diet with no nausea or emesis. Frequent ambulation and movement throughout the day. Bowels and bladder functioning without difficulty. Pain controlled. Has had some drainage from the vulvar incision but no foul odor, thick drainage, increased pain. She had struggled with insomnia intermittently. She reports having a cough but this is not new and she has had this since the beginning of spring. States she has noticed intermittent left lower extremity edema and she has been wearing compression stockings at home and elevating her legs. Denies chest pain, dyspnea.   ER labs reviewed with WBC count at 14.7. A CT scan of the abdomen and pelvis was performed resulting expected post-op changes, no superimposed acute or metastatic process identified. Blood cultures in process. Lactic acid 0.8. Urinalysis complete but challenges with obtaining a true clean catch given open vulva wound. She is awaiting a doppler study to rule out DVT.  CBC    Component Value Date/Time   WBC 14.7 (H) 11/23/2021 2204   RBC 3.13 (L) 11/23/2021 2204   HGB 12.3 11/23/2021 2204   HGB 14.7 06/27/2013 1350   HCT 36.3 11/23/2021 2204   HCT 42.1 06/27/2013 1350   PLT 187 11/23/2021 2204   PLT 115 (L) 06/27/2013 1350   MCV 116.0 (H)  11/23/2021 2204   MCV 104 (H) 06/27/2013 1350   MCH 39.3 (H) 11/23/2021 2204   MCHC 33.9 11/23/2021 2204   RDW 11.4 (L) 11/23/2021 2204   RDW 12.5 06/27/2013 1350   LYMPHSABS 0.7 11/23/2021 2204   LYMPHSABS 1.2 04/26/2012 1007   MONOABS 0.8 11/23/2021 2204   MONOABS 0.6 04/26/2012 1007   EOSABS 0.1 11/23/2021 2204   EOSABS 0.2 04/26/2012 1007   BASOSABS 0.1 11/23/2021 2204   BASOSABS 0.1 04/26/2012 1007   BMET    Component Value Date/Time   NA 131 (L) 11/23/2021 2204   NA 128 (L) 06/27/2013 1350   K 4.9 11/23/2021 2204   K 4.8 06/27/2013 1350   CL 100 11/23/2021 2204   CL 97 (L) 06/27/2013 1350   CO2 23 11/23/2021 2204   CO2 27 06/27/2013 1350   GLUCOSE 115 (H) 11/23/2021 2204   GLUCOSE 91 06/27/2013 1350   BUN 14 11/23/2021 2204   BUN 8 06/27/2013 1350   CREATININE 0.78 11/23/2021 2204   CREATININE 0.88 12/20/2020 1431   CALCIUM 8.9 11/23/2021 2204   CALCIUM 9.6 06/27/2013 1350   GFRNONAA >60 11/23/2021 2204   GFRNONAA >60 06/27/2013 1350     Exam: Alert, oriented, in no acute distress. Lungs clear. Heart regular in rate and rhythm. Abdomen soft, active bowel sounds. Right inguinal incision intact with no surrounding erythema or fluctuance. JP drain dressing intact. Mild erythema around the right JP tubing insertion site without signs  of infection with serous drainage in the tubing. The mons is edematous, more on the left. The left inguinal incision is intact with firmness surrounding. Mild erythema and increased warmth surrounding and on the upper thigh. JP dressing intact with mild surrounding erythema at the insertion site, tubing with serous drainage and scattered debris/tissue. No increased tenderness reported. No significant drainage on peri pad. Vulvar incision has opened with no surrounding erythema, no increased tenderness, no fluctuance. Minimal amount of fibrinous drainage present. No lower extremity edema appreciated.    Assessment/Plan: 66 year old female  currently in the ER for evaluation of fever, nausea, JP drainage drain s/p bilateral inguinal lymph node dissection, partial radical vulvectomy with Dr. Jeral Pinch on 11/11/21 for Stage IB SCC of the vulva. CT AP with expected post-op changes. Case and exam findings have been discussed with Dr. Berline Lopes. Given WBC of 14.7 along with left inguinal/thigh mild erythema/increased warmth/mild induration/drainage changes, recommendation is for keflex (due to allergy, this has been changed to doxycycline per ER provider) per Dr. Berline Lopes for possible beginnings of left inguinal cellulitis post-op. She is advised to follow up as planned with an office appt on June 8 or sooner if needed, if not improving. Discussed use of a sitz bath with the patient and our office brought this to the ER for the patient. Our office will call her tomorrow to check in as well. She is advised to continue monitoring output from bilateral JP drains. Continue plan of care per ER.

## 2021-11-24 NOTE — ED Provider Notes (Signed)
[facility] DEPT Provider Note   CSN: [id number] Arrival date & time: 11/23/21  2028     History  Chief Complaint  Patient presents with   Fever   Post-op Problem    Betty Hurst is a 66 y.o. female who presents to the emergency department with concern for fever of 101.9 F, nausea, and chills as well as change in color of drainage from left percutaneous postoperative drain.  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 Patient took Tylenol prior to arrival with resolution of her fever.  Denies any worsening of her pain or significant change in drainage from vulvar surgical bed.  Does have open draining vulvar wound due to degree of excision, portions left to heal by secondary intention based on physical exam. She does state that the left inguinal percutaneous drain now is draining blood-tinged fluid with debris or before it was draining just yellow fluid like it is currently on the right.  She presented to the ED due to this change in color of percutaneous drain fluid and fever.  Personally reviewed this patient's medical records.    HPI     Home Medications Prior to Admission medications   Medication Sig Start Date End Date Taking? Authorizing Provider  acetaminophen (TYLENOL) 500 MG tablet Take 1,000 mg by mouth every 6 (six) hours as needed for headache.   Yes [provider]  budesonide-formoterol (SYMBICORT) 160-4.5 MCG/ACT inhaler INHALE 2 INHALATIONS BY  MOUTH TWICE DAILY Patient taking differently: Inhale 2 puffs into the lungs 2 (two) times daily. 06/02/21  Yes [name], NP  diphenhydramine-acetaminophen (TYLENOL PM) 25-500 MG TABS tablet Take 1 tablet by mouth at bedtime as needed.   Yes [provider]  ibuprofen (ADVIL) 800 MG tablet Take 1 tablet (800 mg total) by mouth every 8 (eight) hours as  needed for moderate pain. For AFTER surgery 11/12/21  Yes Cross, Melissa D, NP  oxyCODONE (OXY IR/ROXICODONE) 5 MG immediate release tablet Take 1-2 tablets (5-10 mg total) by mouth every 4 (four) hours as needed for moderate pain or severe pain. For AFTER surgery, do not take and drive 1/66/06  Yes Cross, Melissa D, NP  Phenyleph-CPM-DM-APAP (ALKA-SELTZER PLUS COLD & COUGH) 10-21-08-325 MG CAPS Take 2 tablets by mouth every 8 (eight) hours as needed (cold/ allergies).   Yes [provider]  senna-docusate (SENOKOT-S) 8.6-50 MG tablet Take 2 tablets by mouth at bedtime. For AFTER surgery, do not take if having diarrhea Patient not taking: Reported on 11/21/2021 11/12/21   [name] D, NP      Allergies    Influenza vaccines, Morphine, Amitriptyline, and Cefuroxime axetil    Review of Systems   Review of Systems  Constitutional:  Positive for appetite change, chills, fatigue and fever.  Gastrointestinal:  Positive for nausea.       Change in color of percutaneous drain fluid   Physical Exam Updated Vital Signs BP 136/67   Pulse 83   Temp 98.7 F (37.1 C) (Oral)   Resp (!) 24   Ht 5' (1.524 m)   Wt 49.9 kg   SpO2 100%   BMI 21.48 kg/m  Physical Exam Vitals and nursing note reviewed. Exam conducted with a chaperone present.  Constitutional:      Appearance: She is not toxic-appearing.  HENT:     Head: Normocephalic and atraumatic.  Mouth/Throat:     Mouth: Mucous membranes are moist.     Pharynx: No oropharyngeal exudate or posterior oropharyngeal erythema.  Eyes:     General:        Right eye: No discharge.        Left eye: No discharge.     Extraocular Movements: Extraocular movements intact.     Conjunctiva/sclera: Conjunctivae normal.     Pupils: Pupils are equal, round, and reactive to light.  Cardiovascular:     Rate and Rhythm: Normal rate and regular rhythm.     Pulses: Normal pulses.     Heart sounds: Normal heart sounds. No murmur heard. Pulmonary:      Effort: Pulmonary effort is normal. No respiratory distress.     Breath sounds: Normal breath sounds. No wheezing or rales.  Abdominal:     General: Bowel sounds are normal. There is no distension.     Palpations: Abdomen is soft.     Tenderness: There is no abdominal tenderness. There is no right CVA tenderness, left CVA tenderness, guarding or rebound.       Comments: Percutaneous postoperative drains and bilateral inguinal area.  Insertion sites without induration, purulent drainage, or tenderness to palpation.  Dressings are clean and dry.  Yellowish singular sanguinous fluid in the right percutaneous drain.  Blood-tinged clear fluid in the left percutaneous drain with some white debris.  Genitourinary:    Exam position: Supine.    Musculoskeletal:        General: No deformity.     Cervical back: Neck supple.     Right lower leg: No edema.     Left lower leg: No edema.  Skin:    General: Skin is warm and dry.     Capillary Refill: Capillary refill takes less than 2 seconds.  Neurological:     General: No focal deficit present.     Mental Status: She is alert and oriented to person, place, and time. Mental status is at baseline.  Psychiatric:        Mood and Affect: Mood normal.    ED Results / Procedures / Treatments   Labs (all labs ordered are listed, but only abnormal results are displayed) Labs Reviewed  COMPREHENSIVE METABOLIC PANEL - Abnormal; Notable for the following components:      Result Value   Sodium 131 (*)    Glucose, Bld 115 (*)    All other components within normal limits  CBC WITH DIFFERENTIAL/PLATELET - Abnormal; Notable for the following components:   WBC 14.7 (*)    RBC 3.13 (*)    MCV 116.0 (*)    MCH 39.3 (*)    RDW 11.4 (*)    Neutro Abs 13.1 (*)    All other components within normal limits  URINALYSIS, ROUTINE W REFLEX MICROSCOPIC - Abnormal; Notable for the following components:   APPearance TURBID (*)    Hgb urine dipstick MODERATE (*)     Protein, ur >=300 (*)    Leukocytes,Ua MODERATE (*)    All other components within normal limits  URINALYSIS, ROUTINE W REFLEX MICROSCOPIC - Abnormal; Notable for the following components:   Color, Urine STRAW (*)    Leukocytes,Ua MODERATE (*)    All other components within normal limits  CULTURE, BLOOD (ROUTINE X 2)  CULTURE, BLOOD (ROUTINE X 2)  LACTIC ACID, PLASMA    EKG None  Radiology CT ABDOMEN PELVIS W CONTRAST  Result Date: 11/24/2021 CLINICAL DATA:  66 year old female with recent vulvectomy  and inguinal lymph node dissection for vulvar cancer. Purulent discharge from postoperative drain. EXAM: CT ABDOMEN AND PELVIS WITH CONTRAST TECHNIQUE: Multidetector CT imaging of the abdomen and pelvis was performed using the standard protocol following bolus administration of intravenous contrast. RADIATION DOSE REDUCTION: This exam was performed according to the departmental dose-optimization program which includes automated exposure control, adjustment of the mA and/or kV according to patient size and/or use of iterative reconstruction technique. CONTRAST:  158m OMNIPAQUE IOHEXOL 300 MG/ML  SOLN COMPARISON:  [facility] FINDINGS: Lower chest: No cardiomegaly, pericardial effusion, pleural effusion. Mild lung base scarring, including in the right middle lobe, appears postinflammatory. Hepatobiliary: No suspicious liver mass. There are 2 tiny low-density areas in the liver parenchyma which are too small to characterize (e.g. Series 2, image 20) but probably benign cysts. Negative gallbladder. Pancreas: Negative. Spleen: Negative. Adrenals/Urinary Tract: Normal adrenal glands. Symmetric renal enhancement and contrast excretion. Numerous pelvic phleboliths. Unremarkable urinary bladder. Stomach/Bowel: Rectum and anal verge appear within normal limits. Retained gas and stool throughout the colon. Redundant transverse colon.  Evidence of prior appendectomy or partial right colectomy. Unclear whether there is a neo terminal ileum. No large bowel inflammation identified. No dilated small bowel. Decompressed stomach and duodenum. No free air or free fluid. Vascular/Lymphatic: Aortoiliac calcified atherosclerosis. Major arterial structures in the abdomen and pelvis are patent. Bilateral inguinal region percutaneous operative drains are further detailed below. No malignant lymphadenopathy is identified. Portal venous system appears patent. Reproductive: Labia postoperative changes. On the left a small volume of soft tissue gas tracks toward the pelvic floor (series 2, image 76), but is not unexpected following surgery. No regional adverse features identified. Chronically absent uterus.  Diminutive or absent ovaries. Other: No pelvic free fluid. There are bilateral inguinal percutaneous surgical drains in place. There is regional subcutaneous inflammatory stranding, including throughout the ventral lower abdominal wall. But no soft tissue gas or fluid collection. No drainable fluid identified along the course of either drain. Musculoskeletal: Lumbar spine degeneration and scoliosis. No acute or suspicious osseous lesion identified. IMPRESSION: 1. Postoperative changes to the labia and bilateral inguinal percutaneous surgical drains in place. But no unexpected postoperative findings. No fluid collection or other adverse features identified. 2. No superimposed acute or metastatic process identified in the abdomen or pelvis. 3. Aortic Atherosclerosis (ICD10-I70.0). Electronically Signed   By: HGenevie AnnM.D.   On: 11/24/2021 05:29    Procedures Procedures    Medications Ordered in ED Medications  acetaminophen (TYLENOL) tablet 650 mg (650 mg Oral Given 11/24/21 0212)  iohexol (OMNIPAQUE) 300 MG/ML solution 100 mL (100 mLs Intravenous Contrast Given 11/24/21 0455)    ED Course/ Medical Decision Making/ A&P Clinical Course as of 11/24/21 07124  Mon Nov 24, 2021  0551 Consults to Dr. CCarlis Abbott gynecologic oncologist to states that overall picture is reassuring.  She does state that given patient is in the immediate postoperative setting she is high risk for development of DVT and she would suggest pursuing that to rule out clot at this time.  We will plan to do vascular ultrasounds this morning and have the patient follow-up closely in the outpatient setting.  Even if no clot identified on ultrasonography, per Dr. CCarlis Abbottwill avoid antibiosis at this time given no clear source for fever.  Recommends patient follow-up closely in the outpatient setting with gynecologic oncology and return to the ER with any worsening symptoms.  I appreciate her collaboration in the  care of this patient. [RS]    Clinical Course User Index [RS] Joeline Freer, Gypsy Balsam, PA-C                           Medical Decision Making 66 year old female who is 13 days status post partial radical vulvectomy for stage Ib vulvar cancer with bilateral inguinal lymph node dissection who presents with concern for fever and change in color of discharge in left  percutaneous drain.  Afebrile throughout her stay in the emergency department without tachycardia or tachypnea.  Cardiopulmonary exam is unremarkable, abdominal exam as above with percutaneous drains with blood-tinged fluid with debris in the left percutaneous drain.  Well-healing surgical incision sites.  Surgical bed on GU exam without cellulitic changes or convincing purulent discharge.  Potential sources of postoperative fever include but not limited to cellulitis of surgical bed, intra-abdominal phlegmon versus abscess, PE versus DVT.  Patient without tachycardia, persistent tachypnea, hypoxia, or reported chest pain or shortness of breath throughout her stay in the emergency department.  Clinical concern for PE is low.  No calf pain, swelling, redness, or tenderness to palpation on physical exam either.   Amount and/or  Complexity of Data Reviewed Labs: ordered.    Details: CBC with leukocytosis of 14,000 without anemia.  CMP with mild hyponatremia of 131.  UA negative for infection on clean-catch.  Lactic acid is normal. Blood cultures are in process. Radiology: ordered.    Details: CT of the abdomen pelvis visualized this provider with expected postoperative changes but no unexpected fluid collection or other finding concerning for surgical complication. Discussion of management or test interpretation with external provider(s): Consult to gynecologic oncology as above.  We will plan to avoid antibiotics at this time given overall well appearance without clear source for patient's fever.  Risk OTC drugs. Prescription drug management.   Ashmi and her daughter Jonelle Sidle voiced understanding of her medical evaluation and treatment plan thus far.  They are amenable to remain in the emergency department for vascular ultrasound at this time.  Will plan for discharge following ultrasonography with close outpatient follow-up in the gynecologic oncology office.  Medications on discharge pending vascular ultrasound.  Care of this patient signed out to oncoming ED provider Domenic Moras, PA-C at time of shift change.  All pertinent HPI, physical exam, laboratory findings were discussed with him prior to my departure.I appreciate his collaboration in the care of this patient.   This chart was dictated using voice recognition software, Dragon. Despite the best efforts of this provider to proofread and correct errors, errors may still occur which can change documentation meaning.   Final Clinical Impression(s) / ED Diagnoses Final diagnoses:  None    Rx / DC Orders ED Discharge Orders     None         Aura Dials 94/49/67 [id number]    Delora Fuel, MD 38/46/65 [phone]

## 2021-11-24 NOTE — Discharge Instructions (Addendum)
You have been evaluated for your symptoms.  A CT scan of the abdomen and pelvis was obtained which shows expected postoperative change without any concerning complication.  Ultrasounds of your legs did not show any evidence of blood clot.  At this time, we recommend taking Doxycycline, please follow-up closely with your doctor for outpatient care.  Return if your symptoms persist, worsen or if you have any other concern.

## 2021-11-24 NOTE — ED Notes (Signed)
Daughter at bedside.

## 2021-11-24 NOTE — Progress Notes (Signed)
Bilateral lower extremity venous duplex has been completed. Preliminary results can be found in CV Proc through chart review.  Results were given to Domenic Moras PA.  11/24/21 8:40 AM Betty Hurst RVT

## 2021-11-25 ENCOUNTER — Telehealth: Payer: Self-pay | Admitting: *Deleted

## 2021-11-25 NOTE — Telephone Encounter (Signed)
Attempted to call daughter and pt's phone to follow up from the ED visit. Unable to reach both persons. LVM on daughter's phone for a call back because Ms.Bernales did not have her voice mail set up yet.

## 2021-11-25 NOTE — Telephone Encounter (Signed)
Spoke with pt today to follow up from her ED visit. She stated since then she has been taking tylenol and has been fever free. This morning she did not take any tylenol and is still fever free. She denies pain, chills, or any changes in bowel or bladder. She stated she emptied her drain at 0130 this morning and it was 20 cc dark blood tinge on the left and 10 cc output on the right. She emptied it at 1130 today and got out 20 cc on the left and 10 cc on the right. The left side is starting to clear up. She has started her doxycycline yesterday. Informed pt to reach back out to [place] if symptoms gets worse or she needs [place] for anything. Pt verbalized understanding.

## 2021-11-26 ENCOUNTER — Telehealth: Payer: Self-pay | Admitting: *Deleted

## 2021-11-26 NOTE — Telephone Encounter (Signed)
Spoke with Ms.Scripter this morning to inform her that Lenna Sciara wants to keep her appt for tomorrow at 2pm. She stated her drainage is clearing up each day. She empties her drains about 3-5 hrs daily and now gets about 10 cc for each drain. She denies pain, fever or any other complication. Pt verbalized understanding.

## 2021-11-27 ENCOUNTER — Other Ambulatory Visit: Payer: Self-pay

## 2021-11-27 ENCOUNTER — Inpatient Hospital Stay: Payer: Medicare Other | Attending: Gynecologic Oncology | Admitting: Gynecologic Oncology

## 2021-11-27 VITALS — BP 121/59 | HR 82 | Temp 98.5°F | Resp 16 | Ht 60.0 in | Wt 117.4 lb

## 2021-11-27 DIAGNOSIS — C519 Malignant neoplasm of vulva, unspecified: Secondary | ICD-10-CM

## 2021-11-27 NOTE — Patient Instructions (Addendum)
-  Plan to wear compression stockings or sock everyday due to swelling you are having.  -Continue taking the antibiotic as prescribed.  -Continue monitoring JP output. We will plan for removal on Tues of next week or sooner if the amounts decrease to less than 20-10 cc per day.  -You can do the sitz bath several times a day or use the handheld shower to cleanse the open area on the vulva and help with removing drainage from healing. -We will plan to see you in the office on Tuesday for drain removal. If you notice increased redness around the right sided JP drain, please call the office.

## 2021-11-28 NOTE — Progress Notes (Unsigned)
GYN Oncology Post Op Follow Up  Betty Hurst is a 66 year old female s/p bilateral inguinal lymph node dissection, partial radical vulvectomy with Dr. Jeral Pinch on 11/11/21. 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  Starting Sunday June 4, she states she was not feeling well. She laid down to take a nap around 3:30 pm and woke up having chills. She checked her temperature with a reading of 101.9. She also noticed dark drainage with debris coming for the left JP drain in the inguinal region which prompted her to come to the ER.     ER labs reviewed with WBC count at 14.7. A CT scan of the abdomen and pelvis was performed resulting expected post-op changes, no superimposed acute or metastatic process identified. Blood cultures in process. Lactic acid 0.8. Urinalysis complete but challenges with obtaining a true clean catch given open vulva wound. Doppler study to rule out DVT negative. She was started on doxycyline and discharged home.  Interval HPI: She presents today to the office for ER/ postop follow up.  She reports doing better since her ER visit.  She continues to take the doxycycline as ordered.  No more fevers reported since ER visit.  She is tolerating her diet with no nausea or emesis.  Bowels and bladder functioning without difficulty.  She continues to monitor the JP drains with serous drainage noted.  She continues to get around 30 to 40 cc every day from each drain. She changes the JP dressings daily and denies drainage coming from the tubing insertion site. No significant vulvar drainage reported. She has been using the hand held shower to cleanse the wound. Minimal pain reported. Reports mild left lower extremity edema. No concerns voiced.  Exam: Alert, oriented, in no acute distress. Lungs clear. Heart regular in rate and rhythm. Abdomen soft, active bowel sounds. Right inguinal  incision intact with no surrounding erythema or fluctuance. JP drain dressing intact. Mild erythema, more today compared with ER evaluation around the right JP tubing insertion site without underlying fluid/pus, serous drainage in the tubing. The mons is edematous. The left inguinal incision is intact with previous firmness improved. Mild erythema previously noted has resolved on the upper left thigh. JP dressing intact with mild surrounding erythema at the insertion site, tubing with serous drainage and minimal amount of scattered debris/tissue. No increased tenderness reported. Vulvar incision has opened with no surrounding erythema, no increased tenderness, no fluctuance. Minimal amount of fibrinous drainage present. Mild left lower extremity edema, non pitting.   Assessment/Plan: 66 year old female s/p bilateral inguinal lymph node dissection, partial radical vulvectomy with Dr. Jeral Pinch on 11/11/21 for Stage IB SCC of the vulva. She is doing well post-operatively at this time and improving after initiation of doxycycline from recent ER visit. Blood cultures still in process. Recent CT AP in ER with expected post-op changes.   Given current JP drainage amounts, it is recommended she keep the JP drains in place for one more week per Dr. Berline Lopes. She is advised to complete the full course of doxycycline and to wear compresssion stockings for the majority of the day everyday. She is advised to follow up as planned with an office appt in one week for removal or sooner if needed, if not improving. Discussed use of a sitz bath with the patient. She is advised to continue monitoring output from bilateral JP drains.

## 2021-11-29 LAB — CULTURE, BLOOD (ROUTINE X 2)
Culture: NO GROWTH
Culture: NO GROWTH

## 2021-12-01 ENCOUNTER — Other Ambulatory Visit: Payer: Self-pay | Admitting: Oncology

## 2021-12-01 NOTE — Progress Notes (Signed)
Gynecologic Oncology Multi-Disciplinary Disposition Conference Note  Date of the Conference: 12/01/2021  Patient Name: Betty Hurst  Referring Provider: Dr. Mardelle Matte Primary GYN Oncologist: Dr. 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 Disposition is for postoperative radiation.   This Multidisciplinary conference took place involving physicians from [place], Medical Oncology, Radiation Oncology, Pathology, Radiology along with the Gynecologic Oncology Nurse Practitioner and Gynecologic Oncology Nurse Navigator.  Comprehensive assessment of the patient's malignancy, staging, need for surgery, chemotherapy, radiation therapy, and need for further testing were reviewed. Supportive measures, both inpatient and following discharge were also discussed. The recommended plan of care is documented. Greater than 35 minutes were spent correlating and coordinating this patient's care.

## 2021-12-02 ENCOUNTER — Inpatient Hospital Stay (HOSPITAL_BASED_OUTPATIENT_CLINIC_OR_DEPARTMENT_OTHER): Payer: Medicare Other | Admitting: Gynecologic Oncology

## 2021-12-02 ENCOUNTER — Other Ambulatory Visit: Payer: Self-pay

## 2021-12-02 VITALS — BP 128/55 | HR 83 | Temp 98.4°F | Resp 16 | Ht 60.0 in | Wt 117.8 lb

## 2021-12-02 DIAGNOSIS — C519 Malignant neoplasm of vulva, unspecified: Secondary | ICD-10-CM

## 2021-12-02 NOTE — Progress Notes (Signed)
GYN Oncology Post Op Follow Up  Betty Hurst is a 66 year old female s/p bilateral inguinal lymph node dissection, partial radical vulvectomy with Dr. Jeral Pinch on 11/11/21. 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  Starting Sunday June 4, she states she was not feeling well. She laid down to take a nap around 3:30 pm and woke up having chills. She checked her temperature with a reading of 101.9. She also noticed dark drainage with debris coming for the left JP drain in the inguinal region which prompted her to come to the ER.     ER labs reviewed with WBC count at 14.7. A CT scan of the abdomen and pelvis was performed resulting expected post-op changes, no superimposed acute or metastatic process identified. Blood cultures in process. Lactic acid 0.8. Urinalysis complete but challenges with obtaining a true clean catch given open vulva wound. Doppler study to rule out DVT negative. She was started on doxycyline and discharged home. She was seen in the office on 11/27/21 for follow up and was advised at that time to keep the drains in place for another week due to high output.  Interval HPI: She presents today to the office for a drain check and follow up. She reports doing well at home. She has been ambulating frequently to help with the edema in her lower legs. Bowels and bladder functioning without difficulty.  She continues to monitor the JP drains with serous drainage noted.  She is emptying around 80 to 90 cc every day from each drain. She changes the JP dressings daily and denies drainage coming from the tubing insertion site but feel the sutures holding the drain are pulling. No significant vulvar drainage reported. Minimal pain reported, soreness at the drain sites. Reports continued left lower extremity edema.   Exam: Alert, oriented, in no acute distress. Right inguinal incision intact with no  surrounding erythema or fluctuance. JP drain insertion site with less erythema compared to previous visit. Serous drainage in the tubing. The mons remains edematous. The left inguinal incision is intact with previous firmness improved. Mild erythema previously noted has resolved on the upper left thigh. Left JP with mild surrounding erythema at the insertion site, tubing with serous drainage. No increased tenderness reported. Mild left lower extremity edema, non pitting.   Assessment/Plan: 66 year old female s/p bilateral inguinal lymph node dissection, partial radical vulvectomy with Dr. Jeral Pinch on 11/11/21 for Stage IB SCC of the vulva. She is doing well post-operatively at this time and improving after initiation of doxycycline from recent ER visit. Blood cultures still in process. Recent CT AP in ER with expected post-op changes.   Given current JP drainage amounts, it is recommended she keep the JP drains in place per Dr. Berline Lopes. We discussed decreasing the amount of walking/activity she is doing to see if there is a decrease in the drainage. Advised to stay moving throughout the day to assist with preventing blood clots but to avoid long walking episodes etc. She is advised to continue wearing compresssion stockings along with compressive garments. She is advised to follow up as planned with an office appt in one week for follow up or sooner if needed, if not improving. She is advised to continue monitoring output from bilateral JP drains with our office to call and check in frequently. Reportable signs and symptoms reviewed.

## 2021-12-02 NOTE — Patient Instructions (Signed)
Given the amount of drainage coming from both drains, Dr. Berline Lopes would like to keep the drains in longer. If removed, there is a risk for accumulation quickly with the need for new drains to be placed. Continue to monitor the drainage and someone from our office will call you to check in frequently next week. If the drainage decreases to around 40 cc a day or less, call our office for removal of drains.  I would recommend decreasing the amount of walking and activity to see if there is a difference in the drainage. We want you to stay moving throughout the day, but moderate activity can actually increase the amount of drainage in the drains.  Continue wearing the compression garments and elevating your legs. You can also wear compression shorts or leggings with firm support to apply pressure to the upper thigh and mons area.  Please call the office for any new symptoms such as pain, fever, chills, abnormal drainage, etc.

## 2021-12-04 ENCOUNTER — Telehealth: Payer: Self-pay

## 2021-12-04 NOTE — Telephone Encounter (Signed)
Following up with Betty Hurst this afternoon regarding her drain output. Patient states she is overall doing well.   Drain Output 12/03/21: 02:00 - 51m in each drain 13:30 - 364min each drain   Drain Output 12/04/21: 02:30 - 2039mn each drain 12:00 - 20 mL in each drain  Advised patient that the providers will be notified of drainage output. Continue to keep track of drain output. Someone from our office will continue to check in with you and review output log. Patient verbalized understanding. Instructed to call with any needs.

## 2021-12-05 ENCOUNTER — Encounter: Payer: Self-pay | Admitting: Gynecologic Oncology

## 2021-12-05 ENCOUNTER — Emergency Department (HOSPITAL_COMMUNITY)
Admission: EM | Admit: 2021-12-05 | Discharge: 2021-12-06 | Disposition: A | Discharge status: Home / Self Care | Payer: Medicare Other | Attending: Emergency Medicine | Admitting: Emergency Medicine

## 2021-12-05 ENCOUNTER — Other Ambulatory Visit: Payer: Self-pay | Admitting: Gynecologic Oncology

## 2021-12-05 ENCOUNTER — Encounter (HOSPITAL_COMMUNITY): Payer: Self-pay | Admitting: Emergency Medicine

## 2021-12-05 DIAGNOSIS — R109 Unspecified abdominal pain: Secondary | ICD-10-CM | POA: Insufficient documentation

## 2021-12-05 DIAGNOSIS — C519 Malignant neoplasm of vulva, unspecified: Secondary | ICD-10-CM

## 2021-12-05 DIAGNOSIS — G8918 Other acute postprocedural pain: Secondary | ICD-10-CM | POA: Diagnosis not present

## 2021-12-05 DIAGNOSIS — R911 Solitary pulmonary nodule: Secondary | ICD-10-CM | POA: Diagnosis not present

## 2021-12-05 DIAGNOSIS — R600 Localized edema: Secondary | ICD-10-CM | POA: Diagnosis not present

## 2021-12-05 DIAGNOSIS — M7989 Other specified soft tissue disorders: Secondary | ICD-10-CM | POA: Insufficient documentation

## 2021-12-05 NOTE — Telephone Encounter (Signed)
Following up with Betty Hurst this morning. Patient called the after hours triage line last night due to her right drain tube coming out.She denies any bleeding or discharge coming from the site. "I just have some moisture in that area." She endorses redness in the area but reports she has had this and it is not any worse.  [name], NP notified. Advised patient to place a dry dressing over the area and change it at least twice daily or more frequently if it is soiled or wet. Patient reports she saved the drain and it looks white on the end and the end is smooth like it's been cut. Instructed patient to save the drain and bring it to her next office visit. Patient also going to send a picture of the drain and send via mychart for the providers to see.  Instructed patient to limit activity but still be up and moving through out the day. Recommended when patient is resting she  apply direct pressure to that area to help prevent fluid accumulation. (Needs to be evenly distributed ex: a couple bags of frozen vegetables). Offered patient an appointment today in clinic to assist with dressing assistance, patient declined.    Drain output from Left Drain: 12/05/21 02:00 44m  Advised patient to contact our office with increasing redness, swelling, warmth, bleeding, odor, fever, chills or pain. Patient has after hours number as well as office number and instructed to call with any needs.

## 2021-12-05 NOTE — ED Triage Notes (Signed)
Patient reports R JP drain came out today and L JP drain is coming out. States placed after surgery on 5/23.

## 2021-12-05 NOTE — Telephone Encounter (Signed)
Following up with Betty Hurst regarding her [employer] with drain picture and referral for radiation. Unable to contact patient. Left message requesting return call.

## 2021-12-06 ENCOUNTER — Emergency Department (HOSPITAL_COMMUNITY): Payer: Medicare Other

## 2021-12-06 DIAGNOSIS — R109 Unspecified abdominal pain: Secondary | ICD-10-CM | POA: Diagnosis not present

## 2021-12-06 LAB — CBC WITH DIFFERENTIAL/PLATELET
Abs Immature Granulocytes: 0.03 10*3/uL (ref 0.00–0.07)
Basophils Absolute: 0 10*3/uL (ref 0.0–0.1)
Basophils Relative: 1 %
Eosinophils Absolute: 0.1 10*3/uL (ref 0.0–0.5)
Eosinophils Relative: 2 %
HCT: 33.8 % — ABNORMAL LOW (ref 36.0–46.0)
Hemoglobin: 11.5 g/dL — ABNORMAL LOW (ref 12.0–15.0)
Immature Granulocytes: 1 %
Lymphocytes Relative: 20 %
Lymphs Abs: 1.3 10*3/uL (ref 0.7–4.0)
MCH: 39.5 pg — ABNORMAL HIGH (ref 26.0–34.0)
MCHC: 34 g/dL (ref 30.0–36.0)
MCV: 116.2 fL — ABNORMAL HIGH (ref 80.0–100.0)
Monocytes Absolute: 0.6 10*3/uL (ref 0.1–1.0)
Monocytes Relative: 10 %
Neutro Abs: 4.3 10*3/uL (ref 1.7–7.7)
Neutrophils Relative %: 66 %
Platelets: 186 10*3/uL (ref 150–400)
RBC: 2.91 MIL/uL — ABNORMAL LOW (ref 3.87–5.11)
RDW: 11.1 % — ABNORMAL LOW (ref 11.5–15.5)
WBC: 6.4 10*3/uL (ref 4.0–10.5)
nRBC: 0 % (ref 0.0–0.2)

## 2021-12-06 LAB — BASIC METABOLIC PANEL
Anion gap: 7 (ref 5–15)
BUN: 9 mg/dL (ref 8–23)
CO2: 26 mmol/L (ref 22–32)
Calcium: 9.1 mg/dL (ref 8.9–10.3)
Chloride: 102 mmol/L (ref 98–111)
Creatinine, Ser: 0.61 mg/dL (ref 0.44–1.00)
GFR, Estimated: >60 mL/min (ref >60)
Glucose, Bld: 110 mg/dL — ABNORMAL HIGH (ref 70–99)
Potassium: 4.2 mmol/L (ref 3.5–5.1)
Sodium: 135 mmol/L (ref 135–145)

## 2021-12-06 LAB — LACTIC ACID, PLASMA: Lactic Acid, Venous: 0.5 mmol/L (ref 0.5–1.9)

## 2021-12-06 MED ORDER — IOHEXOL 300 MG/ML  SOLN
100.0000 mL | Freq: Once | INTRAMUSCULAR | Status: AC | PRN
  Administered 2021-12-06: 100 mL via INTRAVENOUS

## 2021-12-06 NOTE — Discharge Instructions (Addendum)
At this point it does not appear that your drain needs to be replaced.  If you develop increasing pain, or you develop redness, swelling, drainage from the drain site or a fever, return to the ER.  Otherwise Dr. Charisse March office will contact you this week for follow-up.

## 2021-12-06 NOTE — ED Provider Notes (Signed)
[facility] DEPT Provider Note   CSN: [id number] Arrival date & time: 12/05/21  2224     History  Chief Complaint  Patient presents with   Post-op Problem    Betty Hurst is a 66 y.o. female.  Patient presents to the emergency department for evaluation because her Jackson-Pratt drain that was placed during surgery on May 23 accidentally dislodged yesterday.  Patient reports that she had 1 on the right and on the left after surgery.  The right one never put out much fluid.  She was seen in the office on Tuesday and the drains were left in place because they were still draining some fluid.  Patient also reports that she has been having bilateral leg swelling since the surgery.  She has been elevating and wearing compression stockings, the swelling has gone down a great deal.  She did have vascular ultrasound 2 weeks ago that was negative for DVT.       Home Medications Prior to Admission medications   Medication Sig Start Date End Date Taking? Authorizing Provider  acetaminophen (TYLENOL) 500 MG tablet Take 1,000 mg by mouth every 6 (six) hours as needed for headache.   Yes [provider]  budesonide-formoterol (SYMBICORT) 160-4.5 MCG/ACT inhaler INHALE 2 INHALATIONS BY  MOUTH TWICE DAILY Patient taking differently: Inhale 2 puffs into the lungs 2 (two) times daily. 06/02/21  Yes [name], NP  diphenhydrAMINE HCl (BENADRYL ALLERGY PO) Take 1 tablet by mouth as needed (allergies).   Yes [provider]  diphenhydramine-acetaminophen (TYLENOL PM) 25-500 MG TABS tablet Take 1 tablet by mouth at bedtime as needed (pain).   Yes [provider]  doxycycline (VIBRAMYCIN) 100 MG capsule Take 1 capsule (100 mg total) by mouth 2 (two) times daily. One po bid x 7 days 11/24/21  Yes Domenic Moras, PA-C  ibuprofen (ADVIL) 800 MG tablet Take 1 tablet (800 mg total) by mouth every 8 (eight) hours as needed for moderate pain. For AFTER  surgery 11/12/21  Yes Cross, Melissa D, NP  Phenyleph-CPM-DM-APAP (ALKA-SELTZER PLUS COLD & COUGH) 10-21-08-325 MG CAPS Take 2 tablets by mouth every 8 (eight) hours as needed (cold/ allergies).   Yes [provider]  oxyCODONE (OXY IR/ROXICODONE) 5 MG immediate release tablet Take 1-2 tablets (5-10 mg total) by mouth every 4 (four) hours as needed for moderate pain or severe pain. For AFTER surgery, do not take and drive Patient not taking: Reported on 12/06/2021 11/12/21   [name] D, NP      Allergies    Influenza vaccines, Morphine, Amitriptyline, and Cefuroxime axetil    Review of Systems   Review of Systems  Physical Exam Updated Vital Signs BP (!) 151/89   Pulse 78   Temp 97.9 F (36.6 C) (Oral)   Resp 17   Ht '5\' 1"'$  (1.549 m)   Wt 50.8 kg   SpO2 100%   BMI 21.16 kg/m  Physical Exam Vitals and nursing note reviewed.  Constitutional:      General: She is not in acute distress.    Appearance: She is well-developed.  HENT:     Head: Normocephalic and atraumatic.     Mouth/Throat:     Mouth: Mucous membranes are moist.  Eyes:     General: Vision grossly intact. Gaze aligned appropriately.     Extraocular Movements: Extraocular movements intact.     Conjunctiva/sclera: Conjunctivae normal.  Cardiovascular:     Rate and Rhythm: Normal rate and regular  rhythm.     Pulses: Normal pulses.     Heart sounds: Normal heart sounds, S1 normal and S2 normal. No murmur heard.    No friction rub. No gallop.  Pulmonary:     Effort: Pulmonary effort is normal. No respiratory distress.     Breath sounds: Normal breath sounds.  Abdominal:     General: Bowel sounds are normal.     Palpations: Abdomen is soft.     Tenderness: There is no abdominal tenderness. There is no guarding or rebound.     Hernia: No hernia is present.  Musculoskeletal:        General: No swelling.     Cervical back: Full passive range of motion without pain, normal range of motion and neck supple.  No spinous process tenderness or muscular tenderness. Normal range of motion.     Right lower leg: No edema.     Left lower leg: No edema.  Skin:    General: Skin is warm and dry.     Capillary Refill: Capillary refill takes less than 2 seconds.     Findings: No ecchymosis or wound.     Comments: JP site identified right groin.  Very small amount of erythema, no induration or fluctuance.  Small scab at original site.  JP site in left groin intact, no erythema or drainage.  Neurological:     General: No focal deficit present.     Mental Status: She is alert and oriented to person, place, and time.     GCS: GCS eye subscore is 4. GCS verbal subscore is 5. GCS motor subscore is 6.     Cranial Nerves: Cranial nerves 2-12 are intact.     Sensory: Sensation is intact.     Motor: Motor function is intact.     Coordination: Coordination is intact.  Psychiatric:        Attention and Perception: Attention normal.        Mood and Affect: Mood normal.        Speech: Speech normal.        Behavior: Behavior normal.     ED Results / Procedures / Treatments   Labs (all labs ordered are listed, but only abnormal results are displayed) Labs Reviewed  CBC WITH DIFFERENTIAL/PLATELET - Abnormal; Notable for the following components:      Result Value   RBC 2.91 (*)    Hemoglobin 11.5 (*)    HCT 33.8 (*)    MCV 116.2 (*)    MCH 39.5 (*)    RDW 11.1 (*)    All other components within normal limits  BASIC METABOLIC PANEL - Abnormal; Notable for the following components:   Glucose, Bld 110 (*)    All other components within normal limits  LACTIC ACID, PLASMA    EKG None  Radiology CT ABDOMEN PELVIS W CONTRAST  Addendum Date: 12/06/2021   ADDENDUM REPORT: 12/06/2021 04:08 ADDENDUM: 4 mm nodule in the left lower lung. No follow-up needed if patient is low-risk.This recommendation follows the consensus statement: Guidelines for Management of Incidental Pulmonary Nodules Detected on CT Images:  From the Fleischner Society 2017; Radiology 2017; [id number]. Electronically Signed   By: Lucienne Capers M.D.   On: 12/06/2021 04:08   Result Date: 12/06/2021 CLINICAL DATA:  Abdominal pain postoperatively.  Drain fell out. EXAM: CT ABDOMEN AND PELVIS WITH CONTRAST TECHNIQUE: Multidetector CT imaging of the abdomen and pelvis was performed using the standard protocol following bolus administration of intravenous contrast. RADIATION  DOSE REDUCTION: This exam was performed according to the departmental dose-optimization program which includes automated exposure control, adjustment of the mA and/or kV according to patient size and/or use of iterative reconstruction technique. CONTRAST:  180m OMNIPAQUE IOHEXOL 300 MG/ML  SOLN COMPARISON:  11/24/2021 FINDINGS: Lower chest: Circumscribed nodule in the left lower lung measuring 4 mm diameter. Lungs are otherwise clear. Hepatobiliary: No focal liver abnormality is seen. No gallstones, gallbladder wall thickening, or biliary dilatation. Pancreas: Unremarkable. No pancreatic ductal dilatation or surrounding inflammatory changes. Spleen: Normal in size without focal abnormality. Adrenals/Urinary Tract: Adrenal glands are unremarkable. Kidneys are normal, without renal calculi, focal lesion, or hydronephrosis. Bladder is unremarkable. Stomach/Bowel: Stomach is within normal limits. Appendix is not identified. No evidence of bowel wall thickening, distention, or inflammatory changes. Vascular/Lymphatic: No significant vascular findings are present. No enlarged abdominal or pelvic lymph nodes. Reproductive: Status post hysterectomy. No adnexal masses. Other: No free air or free fluid in the abdomen. Postoperative changes again demonstrated with gas and infiltration in the left labial region. A left inguinal drain remains in place. The right inguinal drain has come out in the interval and there is recurrence of right inguinal collection measuring up to about 1.8 x 5.5 cm in  diameter. Soft tissue infiltration is likely edematous the could indicate cellulitis. Musculoskeletal: Degenerative changes in the spine. No destructive bone lesions. IMPRESSION: 1. Postoperative changes again demonstrated in the labia and groin regions. Recurrence of collection in the right inguinal region after catheter removal. 2. No free air or free fluid in the abdomen. Electronically Signed: By: WLucienne CapersM.D. On: 12/06/2021 03:47    Procedures Procedures    Medications Ordered in ED Medications  iohexol (OMNIPAQUE) 300 MG/ML solution 100 mL (100 mLs Intravenous Contrast Given 12/06/21 0323)    ED Course/ Medical Decision Making/ A&P                           Medical Decision Making Amount and/or Complexity of Data Reviewed Labs: ordered. Radiology: ordered.  Risk Prescription drug management.   Patient presents to the emergency department primarily for evaluation of dislodged JP drain.  Patient had partial radical vulvectomy on May 23 secondary to squamous cell carcinoma of the vulva.  The patient's right inguinal JP drain inadvertently came out yesterday.  Office was concerned about possible infection, sent the patient to the ED for evaluation.  The site looks clean.  No bleeding or drainage.  Very minimal amount of erythema at the prior drain site but no signs of ongoing infection.  Work-up was unremarkable.  Discussed with Dr. BColonel Bald  It was determined that the patient does not require replacement of the drain, message will be sent to Dr. TBerline Lopesto contact the patient after the weekend to see how she is doing.  She was given infectious symptoms return precautions.  Patient also complaining of bilateral leg swelling.  This improves when she elevates her legs.  At this time I do not see any significant swelling.  She did have venous duplex performed 2 weeks ago for similar symptoms and it was negative.  Although she does have risk factors I doubt that this is DVT.  She  was given the option to stay until morning to have repeat venous duplex or follow-up with Dr. TBerline Lopes  As it was felt that there is low likelihood of DVT, she decided to be discharged, was given PE return precautions.  Final Clinical Impression(s) / ED Diagnoses Final diagnoses:  Glennon Mac pratt drain site pain    Rx / DC Orders ED Discharge Orders     None         Darrelle Wiberg, Gwenyth Allegra, MD 12/06/21 [phone]

## 2021-12-08 ENCOUNTER — Telehealth: Payer: Self-pay

## 2021-12-08 NOTE — Telephone Encounter (Signed)
Following up with Betty Hurst this morning after her ED visit over the weekend. She reports her left drain is still putting out about 41m or a little more daily. She states " I feel like I'm coming down with a sinus infection. I'm light headed and have sinus pain and pressure. I might need antibiotics. I normally see Betty Hurst this." Advised patient to follow up with Betty Hurst sinus pain.   Patient reports she does not want to change her dressing for her JP drain as she can see the white part of the drain and is afraid it is going to come out. Dr. TBerline Lopesnotified.   Per Dr. TBerline Lopesschedule patient an appointment for today to have drain removed. Patient notified of Dr. TLulu Ridingrecommendation. Patient does not want to come to clinic today and prefers to rest. Patient reports bilateral leg swelling which has improved with rest. Appointment scheduled for tomorrow 12/09/21 with MJoylene John NP. Patient is in agreement of appointment. Patient instructed to call with any changing symptoms, questions or concerns. Patient verbalized understanding.

## 2021-12-09 ENCOUNTER — Inpatient Hospital Stay (HOSPITAL_BASED_OUTPATIENT_CLINIC_OR_DEPARTMENT_OTHER): Payer: Medicare Other | Admitting: Gynecologic Oncology

## 2021-12-09 ENCOUNTER — Other Ambulatory Visit: Payer: Self-pay

## 2021-12-09 VITALS — BP 160/70 | HR 75 | Temp 98.1°F | Resp 18 | Ht 61.0 in | Wt 112.8 lb

## 2021-12-09 DIAGNOSIS — C519 Malignant neoplasm of vulva, unspecified: Secondary | ICD-10-CM

## 2021-12-09 DIAGNOSIS — Z0000 Encounter for general adult medical examination without abnormal findings: Secondary | ICD-10-CM

## 2021-12-09 DIAGNOSIS — Z4803 Encounter for change or removal of drains: Secondary | ICD-10-CM

## 2021-12-09 NOTE — Patient Instructions (Signed)
Today we removed the left JP drain. Continue to monitor the area for swelling, redness, abnormal drainage. When the area scabs over, you do not need to keep a dressing over the insertion site. Your vulvar incision is healing without signs of infection. We will talk to Dr. Berline Lopes about your follow up appointment and call you with this information.  Please call for any new symptoms such as fever, chills. Try taking in protein shakes if you do not feel like eating solid meals.

## 2021-12-10 NOTE — Progress Notes (Signed)
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 Berline Lopes  Past/Anticipated interventions by medical oncology, if any: no  Weight changes, if any: no  Bowel/Bladder complaints, if any: No.,   Nausea/Vomiting, if any: no  Pain issues, if any:  no  SAFETY ISSUES: Prior radiation? no Pacemaker/ICD? no Possible current pregnancy? no Is the patient on methotrexate? no  Current Complaints / other details:  None noted.

## 2021-12-11 ENCOUNTER — Telehealth: Payer: Self-pay | Admitting: *Deleted

## 2021-12-11 ENCOUNTER — Telehealth: Payer: Self-pay | Admitting: Gynecologic Oncology

## 2021-12-11 ENCOUNTER — Encounter: Payer: Medicare Other | Admitting: Gynecologic Oncology

## 2021-12-11 NOTE — Telephone Encounter (Signed)
Called patient to check in.  She states she is doing better than she was at her recent appointment.  She states she has had no swelling in the left or right groin.  She also reports that her lower extremity edema is stable and has not worsened.  Advised her the office will call her with an appointment with Dr. Berline Lopes in the next couple weeks for a vulvar check.  She does have an appointment for new patient consultation with Dr. Sondra Come on Wednesday of next week.  No concerns or needs voiced.  Advised patient to call for any needs.

## 2021-12-11 NOTE — Telephone Encounter (Signed)
Per Melissa APP moved the patient's post o appt from today to 7/10

## 2021-12-12 ENCOUNTER — Telehealth: Payer: Self-pay | Admitting: *Deleted

## 2021-12-17 ENCOUNTER — Ambulatory Visit
Admission: RE | Admit: 2021-12-17 | Discharge: 2021-12-17 | Disposition: A | Discharge status: Home / Self Care | Payer: Medicare Other | Source: Ambulatory Visit | Attending: Radiation Oncology | Admitting: Radiation Oncology

## 2021-12-17 ENCOUNTER — Encounter: Payer: Self-pay | Admitting: Radiation Oncology

## 2021-12-17 ENCOUNTER — Other Ambulatory Visit: Payer: Self-pay

## 2021-12-17 VITALS — BP 140/68 | HR 86 | Temp 98.8°F | Wt 109.4 lb

## 2021-12-17 DIAGNOSIS — Z803 Family history of malignant neoplasm of breast: Secondary | ICD-10-CM | POA: Insufficient documentation

## 2021-12-17 DIAGNOSIS — Z800 Family history of malignant neoplasm of digestive organs: Secondary | ICD-10-CM | POA: Diagnosis not present

## 2021-12-17 DIAGNOSIS — F17210 Nicotine dependence, cigarettes, uncomplicated: Secondary | ICD-10-CM | POA: Diagnosis not present

## 2021-12-17 DIAGNOSIS — R109 Unspecified abdominal pain: Secondary | ICD-10-CM | POA: Insufficient documentation

## 2021-12-17 DIAGNOSIS — C519 Malignant neoplasm of vulva, unspecified: Secondary | ICD-10-CM | POA: Insufficient documentation

## 2021-12-17 DIAGNOSIS — R197 Diarrhea, unspecified: Secondary | ICD-10-CM | POA: Diagnosis not present

## 2021-12-17 DIAGNOSIS — M7989 Other specified soft tissue disorders: Secondary | ICD-10-CM | POA: Diagnosis not present

## 2021-12-17 DIAGNOSIS — R300 Dysuria: Secondary | ICD-10-CM | POA: Diagnosis not present

## 2021-12-17 DIAGNOSIS — Z85828 Personal history of other malignant neoplasm of skin: Secondary | ICD-10-CM | POA: Diagnosis not present

## 2021-12-17 DIAGNOSIS — Z7951 Long term (current) use of inhaled steroids: Secondary | ICD-10-CM | POA: Diagnosis not present

## 2021-12-17 NOTE — Progress Notes (Signed)
Radiation Oncology         [phone] ________________________________  Initial Outpatient Consultation  Name: Betty Hurst MRN: [id number]  Date: 12/17/2021  DOB: 1955-08-30  XB:DZHGD, Leticia Penna, NP  Lafonda Mosses, MD   REFERRING PHYSICIAN: Lafonda Mosses, MD  DIAGNOSIS: The encounter diagnosis was Vulvar cancer [facility]).  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 She underwent laser ablation for this on 07/04/2013. Per record review, she was lost to follow-up in the coming years after 2016.   The patient presented to gynecology, Dr. Lowella Dell, on 09/09/21 for evaluation of a painful vulvar lesion that had been increasing in size x 6 months. This past January, she began to notice discharge as well as an intermittent small amount of bleeding.  She also reported feeling as though there was a tear or ulcer at the top of the lesion which caused her pain. Pelvic examination performed during this visit revealed a prominent 6 cm raised lesion on the left vulva that extends into the introitus.   Accordingly, Dr. 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  Squamous epithelium noted with strong diffuse p16 positivity on IHC.   Subsequently, the patient was referred to Dr. Berline Lopes on 09/22/21 for further evaluation and management. During this visit, the patient reported an unintentional 3 pound weight loss over the last few months, chronic intermittent diarrhea since surgery for a small bowel obstruction in 2006, and vulvar burning when she  urinates. She otherwise denied any urinary symptoms. In terms of further treatment options. The patient agreed to proceed with a partial radical vulvectomy with bilateral inguinofemoral lymphadenectomy.   Pap smear collected by Dr. Berline Lopes on 09/22/21 revealed findings consistent with a low grade squamous intraepithelial lesion (LSIL); high-grade HPV positive.   PET ordered by Dr. Berline Lopes on 10/16/21 showed a hypermetabolic irregular soft tissue vulvar lesion corresponding with patient's given history of vulvar neoplasm. PET otherwise showed no evidence of hypermetabolic metastatic disease in the neck, chest, abdomen or pelvis. Of note: imaging did show several non-hypermetabolic tiny bilateral pulmonary nodules measuring up to 4 mm, likely infectious or inflammatory in etiology, though metastatic disease is not entirely excluded in regards to this finding.   The patient proceeded to undergo partial (left) radical vulvectomy, inguinal lymphadenectomy, colposcopy of the cervix, and biopsies on 11/11/21 under the care of Dr. Berline Lopes. Pathology from the procedure revealed invasive well to moderately differentiated squamous cell carcinoma of the vulva - p16 positive, measuring 6.0 x 5.0 x 2.0 cm, with a depth of invasion of 14 mm. All margins negative. Biopsy of the vaginal cuff revealed mild squamous dysplasia (LSIL). Nodal status of 7/7 left inguinal lymph nodes negative for carcinoma, and 4/4 right inguinal lymph nodes negative for carcinoma.   During a follow up visit with Dr. Berline Lopes on 11/19/21, the patient was noted to be sore from her procedure and endorsed some intermittent swelling in her legs. Given that some of her final margins on pathology were close, and the size of her tumor, Dr. Berline Lopes advised the patient to proceed  with postoperative radiation. Dr. Berline Lopes also discussed her case at the tumor board held on 12/01/21, with disposition concluded also for post-op radiation.   (LE vascular ultrasound  on 11/25/21 showed no evidence of DVT in either lower extremity).   The patient developed purulent discharge from her post-operative drain. Subsequent CT of the abdomen and pelvis on 11/24/21 showed postoperative changes to the labia and bilateral inguinal percutaneous surgical drains in place. No unexpected postoperative findings, or evidence of metastatic disease in the abdomen or pelvis were appreciated.   Unfortunately, the patients drain fell out and she developed abdominal pain. She presented to the ED for evaluation of this on 12/05/21. CT of the abdomen and pelvis on 12/06/21 again demonstrated postoperative changes in the labia and groin regions. Recurrence of collection in the right inguinal region after catheter removal was also appreciated. In the ED, the patient again endorsed  bilateral leg swelling since the surgery. Following evaluation, it was decided to not replace the drain.   Of note: the patient underwent a hysterectomy several decades ago.    PREVIOUS RADIATION THERAPY: No  PAST MEDICAL HISTORY:  Past Medical History:  Diagnosis Date   Acute non-recurrent sinusitis 10/13/2018   Anemia    Asthma    simbicort, no rescue inhaler use   Basal cell carcinoma of skin    on forehead and temple, and left side of nose   Complication of anesthesia    Frequent sinus infections    Headache    Migraine    PONV (postoperative nausea and vomiting)    Small bowel obstruction ([place])    Surgery in 2006   Vulvar cancer ([place])     PAST SURGICAL HISTORY: Past Surgical History:  Procedure Laterality Date   ABDOMINAL HYSTERECTOMY  1989   thinks had BSO   COLPOSCOPY N/A 11/11/2021   Procedure: COLPOSCOPY OF CERVIX WITH POSSIBLE BIOPSIES;  Surgeon: Lafonda Mosses, MD;  Location: [facility];  Service: Gynecology;  Laterality: N/A;   HEMORROIDECTOMY     INGUINAL LYMPHADENECTOMY Bilateral 11/11/2021   Procedure: INGUINAL LYMPHADENECTOMY;  Surgeon: Lafonda Mosses, MD;  Location: [facility]  ORS;  Service: Gynecology;  Laterality: Bilateral;   NASAL SINUS SURGERY     14 surgeries   OTHER SURGICAL HISTORY     Bladder sling   RADICAL VULVECTOMY N/A 11/11/2021   Procedure: PARTIAL RADICAL VULVECTOMY;  Surgeon: Lafonda Mosses, MD;  Location: [facility];  Service: Gynecology;  Laterality: N/A;   SKIN BIOPSY     basal cell on face   SMALL INTESTINE SURGERY     Small bowel obstruction in 2006   TONSILLECTOMY     vulvectomy      FAMILY HISTORY:  Family History  Problem Relation Age of Onset   Cancer Father    Stroke Father        Deceased   Colon cancer Father    Pancreatic cancer Maternal Uncle    Kidney disease Maternal Grandfather    Stroke Paternal Grandmother    Renal Disease Paternal Grandfather    Heart failure Paternal Grandfather    Heart attack Paternal Grandfather    Breast cancer Niece    Migraines Neg Hx     SOCIAL HISTORY: She is a recent widow with her husband dying of recurrent small cell lung cancer. Social History   Tobacco Use   Smoking status: Every Day    Packs/day: 0.25    Types: Cigarettes   Smokeless tobacco: Never   Tobacco comments:  Patient has cut back "a lot" since her husband has been sick.  Vaping Use   Vaping Use: Never used  Substance Use Topics   Alcohol use: Yes    Comment: daily   Drug use: No    ALLERGIES:  Allergies  Allergen Reactions   Influenza Vaccines Hives   Morphine Hives and Swelling   Amitriptyline Rash   Cefuroxime Axetil Rash    MEDICATIONS:  Current Outpatient Medications  Medication Sig Dispense Refill   acetaminophen (TYLENOL) 500 MG tablet Take 1,000 mg by mouth every 6 (six) hours as needed for headache.     budesonide-formoterol (SYMBICORT) 160-4.5 MCG/ACT inhaler INHALE 2 INHALATIONS BY  MOUTH TWICE DAILY (Patient taking differently: Inhale 2 puffs into the lungs 2 (two) times daily.) 30.6 g 1   diphenhydrAMINE HCl (BENADRYL ALLERGY PO) Take 1 tablet by mouth as needed (allergies).      diphenhydramine-acetaminophen (TYLENOL PM) 25-500 MG TABS tablet Take 1 tablet by mouth at bedtime as needed (pain).     Phenyleph-CPM-DM-APAP (ALKA-SELTZER PLUS COLD & COUGH) 10-21-08-325 MG CAPS Take 2 tablets by mouth every 8 (eight) hours as needed (cold/ allergies).     doxycycline (VIBRAMYCIN) 100 MG capsule Take 1 capsule (100 mg total) by mouth 2 (two) times daily. One po bid x 7 days (Patient not taking: Reported on 12/17/2021) 14 capsule 0   ibuprofen (ADVIL) 800 MG tablet Take 1 tablet (800 mg total) by mouth every 8 (eight) hours as needed for moderate pain. For AFTER surgery (Patient not taking: Reported on 12/17/2021) 30 tablet 0   No current facility-administered medications for this encounter.    REVIEW OF SYSTEMS:  A 10+ POINT REVIEW OF SYSTEMS WAS OBTAINED including neurology, dermatology, psychiatry, cardiac, respiratory, lymph, extremities, GI, GU, musculoskeletal, constitutional, reproductive, HEENT.  She denies any chills or fever at this time.  She denies any hematuria or vaginal bleeding.  She denies any issues with bowel movements.   PHYSICAL EXAM:  General: Alert and oriented, in no acute distress HEENT: Head is normocephalic. Extraocular movements are intact. Neck: Neck is supple, no palpable cervical or supraclavicular lymphadenopathy. Heart: Regular in rate and rhythm with no murmurs, rubs, or gallops. Chest: Clear to auscultation bilaterally, with no rhonchi, wheezes, or rales. Abdomen: Soft, nontender, nondistended, with no rigidity or guarding. Extremities: No cyanosis or edema. Lymphatics: see Neck Exam Skin: No concerning lesions. Musculoskeletal: symmetric strength and muscle tone throughout. Neurologic: Cranial nerves II through XII are grossly intact. No obvious focalities. Speech is fluent. Coordination is intact. Psychiatric: Judgment and insight are intact. Affect is appropriate. On pelvic examination the patient has bilateral inguinal scars healing well  without signs of drainage or infection.  Examination of the perineum reveals surgical changes to the left labia.  Some sutures remain.  Area seems to be healing appropriately.    ECOG = 1  0 - Asymptomatic (Fully active, able to carry on all predisease activities without restriction)  1 - Symptomatic but completely ambulatory (Restricted in physically strenuous activity but ambulatory and able to carry out work of a light or sedentary nature. For example, light housework, office work)  2 - Symptomatic, <50% in bed during the day (Ambulatory and capable of all self care but unable to carry out any work activities. Up and about more than 50% of waking hours)  3 - Symptomatic, >50% in bed, but not bedbound (Capable of only limited self-care, confined to bed or chair 50% or more of waking hours)  4 -  Bedbound (Completely disabled. Cannot carry on any self-care. Totally confined to bed or chair)  5 - Death   Eustace Pen MM, Creech RH, Tormey DC, et al. [phone]). "Toxicity and response criteria of the [facility]". [place] Oncol. 5 (6): 649-55  LABORATORY DATA:  Lab Results  Component Value Date   WBC 6.4 12/06/2021   HGB 11.5 (L) 12/06/2021   HCT 33.8 (L) 12/06/2021   MCV 116.2 (H) 12/06/2021   PLT 186 12/06/2021   NEUTROABS 4.3 12/06/2021   Lab Results  Component Value Date   NA 135 12/06/2021   K 4.2 12/06/2021   CL 102 12/06/2021   CO2 26 12/06/2021   GLUCOSE 110 (H) 12/06/2021   BUN 9 12/06/2021   CREATININE 0.61 12/06/2021   CALCIUM 9.1 12/06/2021      RADIOGRAPHY: CT ABDOMEN PELVIS W CONTRAST  Addendum Date: 12/06/2021   ADDENDUM REPORT: 12/06/2021 04:08 ADDENDUM: 4 mm nodule in the left lower lung. No follow-up needed if patient is low-risk.This recommendation follows the consensus statement: Guidelines for Management of Incidental Pulmonary Nodules Detected on CT Images: From the Fleischner Society 2017; Radiology 2017; 284:228-243. Electronically  Signed   By: Lucienne Capers M.D.   On: 12/06/2021 04:08   Result Date: 12/06/2021 CLINICAL DATA:  Abdominal pain postoperatively.  Drain fell out. EXAM: CT ABDOMEN AND PELVIS WITH CONTRAST TECHNIQUE: Multidetector CT imaging of the abdomen and pelvis was performed using the standard protocol following bolus administration of intravenous contrast. RADIATION DOSE REDUCTION: This exam was performed according to the departmental dose-optimization program which includes automated exposure control, adjustment of the mA and/or kV according to patient size and/or use of iterative reconstruction technique. CONTRAST:  13m OMNIPAQUE IOHEXOL 300 MG/ML  SOLN COMPARISON:  11/24/2021 FINDINGS: Lower chest: Circumscribed nodule in the left lower lung measuring 4 mm diameter. Lungs are otherwise clear. Hepatobiliary: No focal liver abnormality is seen. No gallstones, gallbladder wall thickening, or biliary dilatation. Pancreas: Unremarkable. No pancreatic ductal dilatation or surrounding inflammatory changes. Spleen: Normal in size without focal abnormality. Adrenals/Urinary Tract: Adrenal glands are unremarkable. Kidneys are normal, without renal calculi, focal lesion, or hydronephrosis. Bladder is unremarkable. Stomach/Bowel: Stomach is within normal limits. Appendix is not identified. No evidence of bowel wall thickening, distention, or inflammatory changes. Vascular/Lymphatic: No significant vascular findings are present. No enlarged abdominal or pelvic lymph nodes. Reproductive: Status post hysterectomy. No adnexal masses. Other: No free air or free fluid in the abdomen. Postoperative changes again demonstrated with gas and infiltration in the left labial region. A left inguinal drain remains in place. The right inguinal drain has come out in the interval and there is recurrence of right inguinal collection measuring up to about 1.8 x 5.5 cm in diameter. Soft tissue infiltration is likely edematous the could indicate  cellulitis. Musculoskeletal: Degenerative changes in the spine. No destructive bone lesions. IMPRESSION: 1. Postoperative changes again demonstrated in the labia and groin regions. Recurrence of collection in the right inguinal region after catheter removal. 2. No free air or free fluid in the abdomen. Electronically Signed: By: WLucienne CapersM.D. On: 12/06/2021 03:47   VAS [place]OWER EXTREMITY VENOUS (DVT) (ONLY [facility])  Result Date: 11/24/2021  Lower Venous DVT Study Patient Name:  Betty Hurst Date of Exam:   11/24/2021 Medical Rec #: [id number]      Accession #:    [id number] of Birth: [birth year]      Patient Gender: F Patient Age:  65 years Exam Location:  [facility] Procedure:      VAS [place] LOWER EXTREMITY VENOUS (DVT) Referring Phys: [facility] SPONSELLER --------------------------------------------------------------------------------  Indications: Fever.  Risk Factors: Surgery. Comparison Study: No prior studies. Performing Technologist: Oliver Hum RVT  Examination Guidelines: A complete evaluation includes B-mode imaging, spectral Doppler, color Doppler, and power Doppler as needed of all accessible portions of each vessel. Bilateral testing is considered an integral part of a complete examination. Limited examinations for reoccurring indications may be performed as noted. The reflux portion of the exam is performed with the patient in reverse Trendelenburg.  +---------+---------------+---------+-----------+----------+--------------+ RIGHT    CompressibilityPhasicitySpontaneityPropertiesThrombus Aging +---------+---------------+---------+-----------+----------+--------------+ CFV      Full           Yes      Yes                                 +---------+---------------+---------+-----------+----------+--------------+ SFJ      Full                                                        +---------+---------------+---------+-----------+----------+--------------+ FV  Prox  Full                                                        +---------+---------------+---------+-----------+----------+--------------+ FV Mid   Full                                                        +---------+---------------+---------+-----------+----------+--------------+ FV DistalFull                                                        +---------+---------------+---------+-----------+----------+--------------+ PFV      Full                                                        +---------+---------------+---------+-----------+----------+--------------+ POP      Full           Yes      Yes                                 +---------+---------------+---------+-----------+----------+--------------+ PTV      Full                                                        +---------+---------------+---------+-----------+----------+--------------+ PERO     Full                                                        +---------+---------------+---------+-----------+----------+--------------+   +---------+---------------+---------+-----------+----------+--------------+  LEFT     CompressibilityPhasicitySpontaneityPropertiesThrombus Aging +---------+---------------+---------+-----------+----------+--------------+ CFV      Full           Yes      Yes                                 +---------+---------------+---------+-----------+----------+--------------+ SFJ      Full                                                        +---------+---------------+---------+-----------+----------+--------------+ FV Prox  Full                                                        +---------+---------------+---------+-----------+----------+--------------+ FV Mid   Full                                                        +---------+---------------+---------+-----------+----------+--------------+ FV DistalFull                                                         +---------+---------------+---------+-----------+----------+--------------+ PFV      Full                                                        +---------+---------------+---------+-----------+----------+--------------+ POP      Full           Yes      Yes                                 +---------+---------------+---------+-----------+----------+--------------+ PTV      Full                                                        +---------+---------------+---------+-----------+----------+--------------+ PERO     Full                                                        +---------+---------------+---------+-----------+----------+--------------+     Summary: RIGHT: - There is no evidence of deep vein thrombosis in the lower extremity.  - No cystic structure found in the popliteal fossa.  LEFT: - There is no evidence of deep vein thrombosis in the lower extremity.  - No  cystic structure found in the popliteal fossa.  *See table(s) above for measurements and observations. Electronically signed by Orlie Pollen on 11/24/2021 at 6:59:18 PM.    Final    CT ABDOMEN PELVIS W CONTRAST  Result Date: 11/24/2021 CLINICAL DATA:  66 year old female with recent 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 Purulent discharge from postoperative drain. EXAM: CT ABDOMEN AND PELVIS WITH CONTRAST TECHNIQUE: Multidetector CT imaging of the abdomen and pelvis was performed using the standard protocol following bolus administration of intravenous contrast. RADIATION DOSE REDUCTION: This exam was performed according to the departmental dose-optimization program which includes automated exposure control, adjustment of the mA and/or kV according to patient size and/or use of iterative reconstruction technique. CONTRAST:  133m OMNIPAQUE IOHEXOL 300 MG/ML  SOLN COMPARISON:  [facility]2021[facility] FINDINGS:  Lower chest: No cardiomegaly, pericardial effusion, pleural effusion. Mild lung base scarring, including in the right middle lobe, appears postinflammatory. Hepatobiliary: No suspicious liver mass. There are 2 tiny low-density areas in the liver parenchyma which are too small to characterize (e.g. Series 2, image 20) but probably benign cysts. Negative gallbladder. Pancreas: Negative. Spleen: Negative. Adrenals/Urinary Tract: Normal adrenal glands. Symmetric renal enhancement and contrast excretion. Numerous pelvic phleboliths. Unremarkable urinary bladder. Stomach/Bowel: Rectum and anal verge appear within normal limits. Retained gas and stool throughout the colon. Redundant transverse colon. Evidence of prior appendectomy or partial right colectomy. Unclear whether there is a neo terminal ileum. No large bowel inflammation identified. No dilated small bowel. Decompressed stomach and duodenum. No free air or free fluid. Vascular/Lymphatic: Aortoiliac calcified atherosclerosis. Major arterial structures in the abdomen and pelvis are patent. Bilateral inguinal region percutaneous operative drains are further detailed below. No malignant lymphadenopathy is identified. Portal venous system appears patent. Reproductive: Labia postoperative changes. On the left a small volume of soft tissue gas tracks toward the pelvic floor (series 2, image 76), but is not unexpected following surgery. No regional adverse features identified. Chronically absent uterus.  Diminutive or absent ovaries. Other: No pelvic free fluid. There are bilateral inguinal percutaneous surgical drains in place. There is regional subcutaneous inflammatory stranding, including throughout the ventral lower abdominal wall. But no soft tissue gas or fluid collection. No drainable fluid identified along the course of either drain. Musculoskeletal: Lumbar spine degeneration and scoliosis. No acute or suspicious osseous lesion identified. IMPRESSION: 1.  Postoperative changes to the labia and bilateral inguinal percutaneous surgical drains in place. But no unexpected postoperative findings. No fluid collection or other adverse features identified. 2. No superimposed acute or metastatic process identified in the abdomen or pelvis. 3. Aortic Atherosclerosis (ICD10-I70.0). Electronically Signed   By: HGenevie AnnM.D.   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  This was resected with clear but very close deep margin at 1.9 mm.  Given the size of the lesion and close microscopic margin she would be at risk for local recurrence and would recommend postoperative radiation therapy.  Fortunately her nodal evaluation showed no evidence of metastatic spread and she does not require treatments to the inguinal or pelvis area.  Her case was presented at the multidisciplinary gynecologic oncology conference and recommendations were for postop radiation therapy as above.  Today, I talked to the patient about the findings and work-up  thus far.  We discussed the natural history of vulvar carcinoma and general treatment, highlighting the role of radiotherapy in the management.  We discussed the available radiation techniques, and focused on the details of logistics and delivery.  We reviewed the anticipated acute and late sequelae associated with radiation in this setting.  The patient was encouraged to ask questions that I answered to the best of my ability.  A patient consent form was discussed and signed.  We retained a copy for our records.  The patient would like to proceed with radiation and will be scheduled for CT simulation.  Anticipate 5-1/2 to 6 weeks of postoperative external beam radiation therapy directed at the surgical bed.  PLAN: She is scheduled to meet with Dr. Berline Lopes July 13 for  postoperative check.  Assuming the patient is healed adequately she could then proceed with postoperative radiation therapy as planned.  Patient does live approximately an hour from this radiation facility and is exploring potential treatments in [place] and at the [address] [facility].  She will discuss this issue further with Dr. Berline Lopes when they meet next month.   60 minutes of total time was spent for this patient encounter, including preparation, face-to-face counseling with the patient and coordination of care, physical exam, and documentation of the encounter.   ------------------------------------------------  Betty Promise, PhD, MD  This document serves as a record of services personally performed by Gery Pray, MD. It was created on his behalf by Roney Mans, a trained medical scribe. The creation of this record is based on the scribe's personal observations and the provider's statements to them. This document has been checked and approved by the attending provider.

## 2021-12-19 ENCOUNTER — Telehealth: Payer: Self-pay | Admitting: Primary Care

## 2021-12-19 NOTE — Telephone Encounter (Signed)
Left message for patient to call back and schedule Medicare Annual Wellness Visit (AWV) either virtually or phone   Last AWV ;11/26/14   I left my direct # [phone]

## 2021-12-22 ENCOUNTER — Encounter: Payer: Medicare Other | Admitting: Gynecologic Oncology

## 2021-12-26 ENCOUNTER — Encounter: Payer: Medicare Other | Admitting: Primary Care

## 2021-12-29 ENCOUNTER — Telehealth: Payer: Self-pay | Admitting: Primary Care

## 2021-12-29 NOTE — Telephone Encounter (Signed)
Left message for patient to call back and schedule Medicare Annual Wellness Visit (AWV) either virtually or phone   Last AWV ;11/26/14   I left my direct # [phone]

## 2021-12-30 ENCOUNTER — Encounter: Payer: Self-pay | Admitting: Gynecologic Oncology

## 2022-01-01 ENCOUNTER — Other Ambulatory Visit: Payer: Self-pay

## 2022-01-01 ENCOUNTER — Ambulatory Visit (INDEPENDENT_AMBULATORY_CARE_PROVIDER_SITE_OTHER): Payer: Medicare Other

## 2022-01-01 ENCOUNTER — Encounter: Payer: Self-pay | Admitting: Gynecologic Oncology

## 2022-01-01 ENCOUNTER — Inpatient Hospital Stay: Payer: Medicare Other | Attending: Gynecologic Oncology | Admitting: Gynecologic Oncology

## 2022-01-01 VITALS — Ht 61.0 in | Wt 112.0 lb

## 2022-01-01 VITALS — BP 124/61 | HR 85 | Temp 97.8°F | Resp 16 | Ht 61.0 in | Wt 112.5 lb

## 2022-01-01 DIAGNOSIS — C519 Malignant neoplasm of vulva, unspecified: Secondary | ICD-10-CM

## 2022-01-01 DIAGNOSIS — Z0000 Encounter for general adult medical examination without abnormal findings: Secondary | ICD-10-CM | POA: Diagnosis not present

## 2022-01-01 DIAGNOSIS — Z7189 Other specified counseling: Secondary | ICD-10-CM

## 2022-01-01 NOTE — Patient Instructions (Signed)
Betty Hurst , Thank you for taking time to come for your Medicare Wellness Visit. I appreciate your ongoing commitment to your health goals. Please review the following plan we discussed and let me know if I can assist you in the future.   Screening recommendations/referrals: Colonoscopy: completed 06/23/2011, due now Mammogram: completed 02/13/2021, due 02/14/2022 Bone Density: n/a Recommended yearly ophthalmology/optometry visit for glaucoma screening and checkup Recommended yearly dental visit for hygiene and checkup  Vaccinations: Influenza vaccine: allergy Pneumococcal vaccine: n/a Tdap vaccine: due Shingles vaccine: discussed  Covid-19:  04/29/2020, 10/23/2019, 10/02/2019  Advanced directives: Please bring a copy of your POA (Power of Attorney) and/or Living Will to your next appointment.   Conditions/risks identified: smoking  Next appointment: Follow up in one year for your annual wellness visit.   Preventive Care 40-64 Years, Female Preventive care refers to lifestyle choices and visits with your health care provider that can promote health and wellness. What does preventive care include? A yearly physical exam. This is also called an annual well check. Dental exams once or twice a year. Routine eye exams. Ask your health care provider how often you should have your eyes checked. Personal lifestyle choices, including: Daily care of your teeth and gums. Regular physical activity. Eating a healthy diet. Avoiding tobacco and drug use. Limiting alcohol use. Practicing safe sex. Taking low-dose aspirin daily starting at age 51. Taking vitamin and mineral supplements as recommended by your health care provider. What happens during an annual well check? The services and screenings done by your health care provider during your annual well check will depend on your age, overall health, lifestyle risk factors, and family history of disease. Counseling  Your health care provider may ask  you questions about your: Alcohol use. Tobacco use. Drug use. Emotional well-being. Home and relationship well-being. Sexual activity. Eating habits. Work and work Statistician. Method of birth control. Menstrual cycle. Pregnancy history. Screening  You may have the following tests or measurements: Height, weight, and BMI. Blood pressure. Lipid and cholesterol levels. These may be checked every 5 years, or more frequently if you are over 36 years old. Skin check. Lung cancer screening. You may have this screening every year starting at age 25 if you have a 30-pack-year history of smoking and currently smoke or have quit within the past 15 years. Fecal occult blood test (FOBT) of the stool. You may have this test every year starting at age 53. Flexible sigmoidoscopy or colonoscopy. You may have a sigmoidoscopy every 5 years or a colonoscopy every 10 years starting at age 68. Hepatitis C blood test. Hepatitis B blood test. Sexually transmitted disease (STD) testing. Diabetes screening. This is done by checking your blood sugar (glucose) after you have not eaten for a while (fasting). You may have this done every 1-3 years. Mammogram. This may be done every 1-2 years. Talk to your health care provider about when you should start having regular mammograms. This may depend on whether you have a family history of breast cancer. BRCA-related cancer screening. This may be done if you have a family history of breast, ovarian, tubal, or peritoneal cancers. Pelvic exam and Pap test. This may be done every 3 years starting at age 28. Starting at age 7, this may be done every 5 years if you have a Pap test in combination with an HPV test. Bone density scan. This is done to screen for osteoporosis. You may have this scan if you are at high risk for osteoporosis. Discuss  your test results, treatment options, and if necessary, the need for more tests with your health care provider. Vaccines  Your  health care provider may recommend certain vaccines, such as: Influenza vaccine. This is recommended every year. Tetanus, diphtheria, and acellular pertussis (Tdap, Td) vaccine. You may need a Td booster every 10 years. Zoster vaccine. You may need this after age 75. Pneumococcal 13-valent conjugate (PCV13) vaccine. You may need this if you have certain conditions and were not previously vaccinated. Pneumococcal polysaccharide (PPSV23) vaccine. You may need one or two doses if you smoke cigarettes or if you have certain conditions. Talk to your health care provider about which screenings and vaccines you need and how often you need them. This information is not intended to replace advice given to you by your health care provider. Make sure you discuss any questions you have with your health care provider. Document Released: 07/05/2015 Document Revised: 02/26/2016 Document Reviewed: 04/09/2015 Elsevier Interactive Patient Education  2017 [employer] Prevention in the Home Falls can cause injuries. They can happen to people of all ages. There are many things you can do to make your home safe and to help prevent falls. What can I do on the outside of my home? Regularly fix the edges of walkways and driveways and fix any cracks. Remove anything that might make you trip as you walk through a door, such as a raised step or threshold. Trim any bushes or trees on the path to your home. Use bright outdoor lighting. Clear any walking paths of anything that might make someone trip, such as rocks or tools. Regularly check to see if handrails are loose or broken. Make sure that both sides of any steps have handrails. Any raised decks and porches should have guardrails on the edges. Have any leaves, snow, or ice cleared regularly. Use sand or salt on walking paths during winter. Clean up any spills in your garage right away. This includes oil or grease spills. What can I do in the  bathroom? Use night lights. Install grab bars by the toilet and in the tub and shower. Do not use towel bars as grab bars. Use non-skid mats or decals in the tub or shower. If you need to sit down in the shower, use a plastic, non-slip stool. Keep the floor dry. Clean up any water that spills on the floor as soon as it happens. Remove soap buildup in the tub or shower regularly. Attach bath mats securely with double-sided non-slip rug tape. Do not have throw rugs and other things on the floor that can make you trip. What can I do in the bedroom? Use night lights. Make sure that you have a light by your bed that is easy to reach. Do not use any sheets or blankets that are too big for your bed. They should not hang down onto the floor. Have a firm chair that has side arms. You can use this for support while you get dressed. Do not have throw rugs and other things on the floor that can make you trip. What can I do in the kitchen? Clean up any spills right away. Avoid walking on wet floors. Keep items that you use a lot in easy-to-reach places. If you need to reach something above you, use a strong step stool that has a grab bar. Keep electrical cords out of the way. Do not use floor polish or wax that makes floors slippery. If you must use wax, use  non-skid floor wax. Do not have throw rugs and other things on the floor that can make you trip. What can I do with my stairs? Do not leave any items on the stairs. Make sure that there are handrails on both sides of the stairs and use them. Fix handrails that are broken or loose. Make sure that handrails are as long as the stairways. Check any carpeting to make sure that it is firmly attached to the stairs. Fix any carpet that is loose or worn. Avoid having throw rugs at the top or bottom of the stairs. If you do have throw rugs, attach them to the floor with carpet tape. Make sure that you have a light switch at the top of the stairs and the  bottom of the stairs. If you do not have them, ask someone to add them for you. What else can I do to help prevent falls? Wear shoes that: Do not have high heels. Have rubber bottoms. Are comfortable and fit you well. Are closed at the toe. Do not wear sandals. If you use a stepladder: Make sure that it is fully opened. Do not climb a closed stepladder. Make sure that both sides of the stepladder are locked into place. Ask someone to hold it for you, if possible. Clearly mark and make sure that you can see: Any grab bars or handrails. First and last steps. Where the edge of each step is. Use tools that help you move around (mobility aids) if they are needed. These include: Canes. Walkers. Scooters. Crutches. Turn on the lights when you go into a dark area. Replace any light bulbs as soon as they burn out. Set up your furniture so you have a clear path. Avoid moving your furniture around. If any of your floors are uneven, fix them. If there are any pets around you, be aware of where they are. Review your medicines with your doctor. Some medicines can make you feel dizzy. This can increase your chance of falling. Ask your doctor what other things that you can do to help prevent falls. This information is not intended to replace advice given to you by your health care provider. Make sure you discuss any questions you have with your health care provider. Document Released: 04/04/2009 Document Revised: 11/14/2015 Document Reviewed: 07/13/2014 Elsevier Interactive Patient Education  2017 [employer].

## 2022-01-01 NOTE — Patient Instructions (Signed)
It was good to see you today.  You are healing very well.  Please still be careful about doing any of the heavy lifting for another couple of weeks.  I will see you after you finish treatment with Dr. Sondra Come.

## 2022-01-01 NOTE — Progress Notes (Signed)
I connected with Betty Hurst today by telephone and verified that I am speaking with the correct person using two identifiers. Location patient: home Location provider: work Persons participating in the virtual visit: Yaquelin, Langelier LPN.   I discussed the limitations, risks, security and privacy concerns of performing an evaluation and management service by telephone and the availability of in person appointments. I also discussed with the patient that there may be a patient responsible charge related to this service. The patient expressed understanding and verbally consented to this telephonic visit.    Interactive audio and video telecommunications were attempted between this provider and patient, however failed, due to patient having technical difficulties OR patient did not have access to video capability.  We continued and completed visit with audio only.     Vital signs may be patient reported or missing.  Subjective:   Betty Hurst is a 66 y.o. female who presents for Medicare Annual (Subsequent) preventive examination.  Review of Systems     Cardiac Risk Factors include: smoking/ tobacco exposure     Objective:    Today's Vitals   01/01/22 1227  Weight: 112 lb (50.8 kg)  Height: '5\' 1"'$  (1.549 m)   Body mass index is 21.16 kg/m.     01/01/2022   12:31 PM 12/17/2021    2:55 PM 11/23/2021    8:59 PM 11/11/2021   12:45 PM 11/06/2021    8:05 AM 01/08/2015   12:18 PM  Advanced Directives  Does Patient Have a Medical Advance Directive? Yes Yes No No No No  Type of Paramedic of [place];Living will       Copy of [place] in Chart? No - copy requested       Would patient like information on creating a medical advance directive?   No - Patient declined No - Patient declined  No - patient declined information    Current Medications (verified) Outpatient Encounter Medications as of 01/01/2022  Medication Sig    acetaminophen (TYLENOL) 500 MG tablet Take 1,000 mg by mouth every 6 (six) hours as needed for headache.   budesonide-formoterol (SYMBICORT) 160-4.5 MCG/ACT inhaler INHALE 2 INHALATIONS BY  MOUTH TWICE DAILY (Patient taking differently: Inhale 2 puffs into the lungs 2 (two) times daily.)   diphenhydrAMINE HCl (BENADRYL ALLERGY PO) Take 1 tablet by mouth as needed (allergies).   diphenhydramine-acetaminophen (TYLENOL PM) 25-500 MG TABS tablet Take 1 tablet by mouth at bedtime as needed (pain).   Phenyleph-CPM-DM-APAP (ALKA-SELTZER PLUS COLD & COUGH) 10-21-08-325 MG CAPS Take 2 tablets by mouth every 8 (eight) hours as needed (cold/ allergies).   No facility-administered encounter medications on file as of 01/01/2022.    Allergies (verified) Influenza vaccines, Morphine, Amitriptyline, and Cefuroxime axetil   History: Past Medical History:  Diagnosis Date   Acute non-recurrent sinusitis 10/13/2018   Anemia    Asthma    simbicort, no rescue inhaler use   Basal cell carcinoma of skin    on forehead and temple, and left side of nose   Complication of anesthesia    Frequent sinus infections    Headache    Migraine    PONV (postoperative nausea and vomiting)    Small bowel obstruction ([facility])    Surgery in 2006   Vulvar cancer [facility])    Past Surgical History:  Procedure Laterality Date   ABDOMINAL HYSTERECTOMY  1989   thinks had BSO   COLPOSCOPY N/A 11/11/2021   Procedure: COLPOSCOPY OF  CERVIX WITH POSSIBLE BIOPSIES;  Surgeon: Lafonda Mosses, MD;  Location: [facility];  Service: Gynecology;  Laterality: N/A;   HEMORROIDECTOMY     INGUINAL LYMPHADENECTOMY Bilateral 11/11/2021   Procedure: INGUINAL LYMPHADENECTOMY;  Surgeon: Lafonda Mosses, MD;  Location: [facility];  Service: Gynecology;  Laterality: Bilateral;   NASAL SINUS SURGERY     14 surgeries   OTHER SURGICAL HISTORY     Bladder sling   RADICAL VULVECTOMY N/A 11/11/2021   Procedure: PARTIAL RADICAL VULVECTOMY;  Surgeon: Lafonda Mosses, MD;  Location: [facility];  Service: Gynecology;  Laterality: N/A;   SKIN BIOPSY     basal cell on face   SMALL INTESTINE SURGERY     Small bowel obstruction in 2006   TONSILLECTOMY     vulvectomy     Family History  Problem Relation Age of Onset   Cancer Father    Stroke Father        Deceased   Colon cancer Father    Pancreatic cancer Maternal Uncle    Kidney disease Maternal Grandfather    Stroke Paternal Grandmother    Renal Disease Paternal Grandfather    Heart failure Paternal Grandfather    Heart attack Paternal Grandfather    Breast cancer Niece    Migraines Neg Hx    Social History   Socioeconomic History   Marital status: Widowed    Spouse name: Not on file   Number of children: 2   Years of education: 2 years college   Highest education level: Not on file  Occupational History   Not on file  Tobacco Use   Smoking status: Every Day    Packs/day: 0.25    Types: Cigarettes   Smokeless tobacco: Never   Tobacco comments:    Patient has cut back "a lot" since her husband has been sick.  Vaping Use   Vaping Use: Never used  Substance and Sexual Activity   Alcohol use: Not Currently    Comment: daily   Drug use: No   Sexual activity: Yes    Partners: Male  Other Topics Concern   Not on file  Social History Narrative   Married.   Lives in [place] with her husband.   2 daughters, 4 grandchildren.   On disability from work.   Enjoys being outside, gardening, volunteers at [employer].   Caffeine: diet coke 16-24 oz per day   Social Determinants of Health   Financial Resource Strain: Low Risk  (01/01/2022)   Overall Financial Resource Strain (CARDIA)    Difficulty of Paying Living Expenses: Not hard at all  Food Insecurity: No Food Insecurity (01/01/2022)   Hunger Vital Sign    Worried About Running Out of Food in the Last Year: Never true    Ran Out of Food in the Last Year: Never true  Transportation Needs: No Transportation  Needs (01/01/2022)   PRAPARE - Hydrologist (Medical): No    Lack of Transportation (Non-Medical): No  Physical Activity: Inactive (01/01/2022)   Exercise Vital Sign    Days of Exercise per Week: 0 days    Minutes of Exercise per Session: 0 min  Stress: Stress Concern Present (01/01/2022)   [place]    Feeling of Stress : To some extent  Social Connections: Not on file    Tobacco Counseling Ready to quit: Yes Counseling given: Not Answered Tobacco comments: Patient has cut back "a  lot" since her husband has been sick.   Clinical Intake:  Pre-visit preparation completed: Yes  Pain : No/denies pain     Nutritional Status: BMI of 19-24  Normal Nutritional Risks: None Diabetes: No  How often do you need to have someone help you when you read instructions, pamphlets, or other written materials from your doctor or pharmacy?: 1 - Never What is the last grade level you completed in school?: 76yr college  Diabetic? no  Interpreter Needed?: No  Information entered by :: NAllen LPN   Activities of Daily Living    01/01/2022   12:32 PM 11/11/2021    7:39 PM  In your present state of health, do you have any difficulty performing the following activities:  Hearing? 0 0  Vision? 0 0  Difficulty concentrating or making decisions? 0 0  Walking or climbing stairs? 0 0  Dressing or bathing? 0 0  Doing errands, shopping? 0 0  Preparing Food and eating ? N   Using the Toilet? N   In the past six months, have you accidently leaked urine? N   Do you have problems with loss of bowel control? N   Managing your Medications? N   Managing your Finances? N   Housekeeping or managing your Housekeeping? N     Patient Care Team: CPleas Koch NP as PCP - General (Nurse Practitioner)  Indicate any recent Medical Services you may have received from other than [facility] providers in the past  year (date may be approximate).     Assessment:   This is a routine wellness examination for LPiedad  Hearing/Vision screen Vision Screening - Comments:: Regular eye exams, Dr. WEllin Mayhew Dietary issues and exercise activities discussed: Current Exercise Habits: The patient does not participate in regular exercise at present   Goals Addressed             This Visit's Progress    Patient Stated       01/01/2022, no goals       Depression Screen    01/01/2022   12:31 PM 11/26/2014    4:03 PM  PHQ 2/9 Scores  PHQ - 2 Score 0 0    Fall Risk    01/01/2022   12:31 PM 03/27/2015    2:18 PM 11/26/2014    4:03 PM  [place]oughke[place]in the past year? 0 No No  Number falls in past yr: 0    Injury with Fall? 0    Risk for fall due to : No Fall Risks    Follow up Falls evaluation completed;Education provided;Falls prevention discussed      FALL RISK PREVENTION PERTAINING TO THE HOME:  Any stairs in or around the home? Yes  If so, are there any without handrails? No  Home free of loose throw rugs in walkways, pet beds, electrical cords, etc? Yes  Adequate lighting in your home to reduce risk of falls? Yes   ASSISTIVE DEVICES UTILIZED TO PREVENT FALLS:  Life alert? No  Use of a cane, walker or w/c? No  Grab bars in the bathroom? Yes  Shower chair or bench in shower? Yes  Elevated toilet seat or a handicapped toilet? No   TIMED UP AND GO:  Was the test performed? No .      Cognitive Function:        01/01/2022   12:32 PM  6CIT Screen  What Year? 0 points  What month? 0 points  What time? 0  points  Count back from 20 0 points  Months in reverse 0 points  Repeat phrase 2 points  Total Score 2 points    Immunizations Immunization History  Administered Date(s) Administered   PFIZER(Purple Top)SARS-COV-2 Vaccination 10/02/2019, 10/23/2019, 04/29/2020   Pneumococcal Polysaccharide-23 11/26/2014   Td 06/22/2008    TDAP status: Due, Education has been provided  regarding the importance of this vaccine. Advised may receive this vaccine at local pharmacy or Health Dept. Aware to provide a copy of the vaccination record if obtained from local pharmacy or Health Dept. Verbalized acceptance and understanding.  Flu Vaccine status: Declined, Education has been provided regarding the importance of this vaccine but patient still declined. Advised may receive this vaccine at local pharmacy or Health Dept. Aware to provide a copy of the vaccination record if obtained from local pharmacy or Health Dept. Verbalized acceptance and understanding.  Pneumococcal vaccine status: Up to date  Covid-19 vaccine status: Completed vaccines  Qualifies for Shingles Vaccine? Yes   Zostavax completed No   Shingrix Completed?: No.    Education has been provided regarding the importance of this vaccine. Patient has been advised to call insurance company to determine out of pocket expense if they have not yet received this vaccine. Advised may also receive vaccine at local pharmacy or Health Dept. Verbalized acceptance and understanding.  Screening Tests Health Maintenance  Topic Date Due   Zoster Vaccines- Shingrix (1 of 2) Never done   TETANUS/TDAP  06/22/2018   COVID-19 Vaccine (4 - Booster for Pfizer series) 06/24/2020   COLONOSCOPY (Pts 45-23yr Insurance coverage will need to be confirmed)  06/22/2021   INFLUENZA VACCINE  01/20/2022   MAMMOGRAM  02/14/2023   Hepatitis C Screening  Completed   HIV Screening  Completed   HPV VACCINES  Aged Out   PAP SMEAR-Modifier  Discontinued    Health Maintenance  Health Maintenance Due  Topic Date Due   Zoster Vaccines- Shingrix (1 of 2) Never done   TETANUS/TDAP  06/22/2018   COVID-19 Vaccine (4 - Booster for [place]) 06/24/2020   COLONOSCOPY (Pts 45-468yrInsurance coverage will need to be confirmed)  06/22/2021    Colorectal cancer screening: Type of screening: Colonoscopy. Completed 06/23/2011. Repeat every 10  years  Mammogram status: Completed 02/13/2021. Repeat every year  Bone Density status: n/a  Lung Cancer Screening: (Low Dose CT Chest recommended if Age 708-80ears, 30 pack-year currently smoking OR have quit w/in 15years.) does not qualify.   Lung Cancer Screening Referral: no  Additional Screening:  Hepatitis C Screening: does qualify; Completed 12/02/2015  Vision Screening: Recommended annual ophthalmology exams for early detection of glaucoma and other disorders of the eye. Is the patient up to date with their annual eye exam?  Yes  Who is the provider or what is the name of the office in which the patient attends annual eye exams? Dr. WoEllin Mayhewf pt is not established with a provider, would they like to be referred to a provider to establish care? No .   Dental Screening: Recommended annual dental exams for proper oral hygiene  Community Resource Referral / Chronic Care Management: CRR required this visit?  No   CCM required this visit?  No      Plan:     I have personally reviewed and noted the following in the patient's chart:   Medical and social history Use of alcohol, tobacco or illicit drugs  Current medications and supplements including opioid prescriptions.  Functional ability and status  Nutritional status Physical activity Advanced directives List of other physicians Hospitalizations, surgeries, and ER visits in previous 12 months Vitals Screenings to include cognitive, depression, and falls Referrals and appointments  In addition, I have reviewed and discussed with patient certain preventive protocols, quality metrics, and best practice recommendations. A written personalized care plan for preventive services as well as general preventive health recommendations were provided to patient.     Kellie Simmering, LPN   10/24/6977   Nurse Notes: none  Due to this being a virtual visit, the after visit summary with patients personalized plan was offered to  patient via mail or my-chart. Patient would like to access on my-chart

## 2022-01-01 NOTE — Progress Notes (Signed)
Gynecologic Oncology Return Clinic Visit  01/01/2022  Reason for Visit: Follow-up after surgery, treatment discussion  Treatment History: The patient recently presented with a painful left vulvar lesion and associated discharge and bleeding.  This has increased in symptoms over the last 6 months.  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  Per her report, she was followed after and had laser treatment of what was likely precancer again.  Prior notes show that she was taken to the operating room on 07/04/2013 in the setting of VIN 3 for CO2 laser ablation.  She was then lost to follow-up after 2016.  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  Squamous epithelium is strong diffuse p16 positivity on IHC.  10/16/2021: Pretreatment PET scan shows hypermetabolic irregular soft tissue vulvar lesion.  No hypermetabolic metastatic disease.  Tiny bilateral pulmonary nodules measure up to 4 mm, favor to be infectious or inflammatory.  Not hypermetabolic but below the resolution of PET scan.  Short interval chest CT recommended.  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  Margins negative although closest deep margin 1.9 mm.  Tumor size 6 x 5 x 2 cm.  Inguinal lymph nodes negative.  Interval History: Patient reports overall doing well.  Edema in her legs has significantly decreased.  She still has some mild swelling around her groin incisions.  Has very minimal discharge.  Denies any bleeding.  Reports regular bowel and bladder function.  Past Medical/Surgical History: Past Medical History:  Diagnosis Date   Acute non-recurrent sinusitis 10/13/2018   Anemia    Asthma    simbicort, no rescue inhaler use   Basal cell carcinoma of skin    on forehead and temple, and left side of nose    Complication of anesthesia    Frequent sinus infections    Headache    Migraine    PONV (postoperative nausea and vomiting)    Small bowel obstruction ([place])    Surgery in 2006   Vulvar cancer [facility])     Past Surgical History:  Procedure Laterality Date   ABDOMINAL HYSTERECTOMY  1989   thinks had BSO   COLPOSCOPY N/A 11/11/2021   Procedure: COLPOSCOPY OF CERVIX WITH POSSIBLE BIOPSIES;  Surgeon: Lafonda Mosses, MD;  Location: [facility];  Service: Gynecology;  Laterality: N/A;   HEMORROIDECTOMY     INGUINAL LYMPHADENECTOMY Bilateral 11/11/2021   Procedure: INGUINAL LYMPHADENECTOMY;  Surgeon: Lafonda Mosses, MD;  Location: [facility];  Service: Gynecology;  Laterality: Bilateral;   NASAL SINUS SURGERY     14 surgeries   OTHER SURGICAL HISTORY     Bladder sling   RADICAL VULVECTOMY N/A 11/11/2021   Procedure: PARTIAL RADICAL VULVECTOMY;  Surgeon: Lafonda Mosses, MD;  Location: [facility];  Service: Gynecology;  Laterality: N/A;   SKIN BIOPSY     basal cell on face   SMALL INTESTINE SURGERY     Small bowel obstruction in 2006   TONSILLECTOMY     vulvectomy      Family History  Problem Relation Age of Onset   Cancer Father    Stroke Father        Deceased   Colon cancer Father    Pancreatic cancer Maternal Uncle    Kidney disease Maternal Grandfather    Stroke Paternal Grandmother  Renal Disease Paternal Grandfather    Heart failure Paternal Grandfather    Heart attack Paternal Grandfather    Breast cancer Niece    Migraines Neg Hx     Social History   Socioeconomic History   Marital status: Widowed    Spouse name: Not on file   Number of children: 2   Years of education: 2 years college   Highest education level: Not on file  Occupational History   Not on file  Tobacco Use   Smoking status: Every Day    Packs/day: 0.25    Types: Cigarettes   Smokeless tobacco: Never   Tobacco comments:    Patient has cut back "a lot" since her husband has been sick.   Vaping Use   Vaping Use: Never used  Substance and Sexual Activity   Alcohol use: Not Currently    Comment: daily   Drug use: No   Sexual activity: Yes    Partners: Male  Other Topics Concern   Not on file  Social History Narrative   Married.   Lives in [place] with her husband.   2 daughters, 4 grandchildren.   On disability from work.   Enjoys being outside, gardening, volunteers at [employer].   Caffeine: diet coke 16-24 oz per day   Social Determinants of Health   Financial Resource Strain: Low Risk  (01/01/2022)   Overall Financial Resource Strain (CARDIA)    Difficulty of Paying Living Expenses: Not hard at all  Food Insecurity: No Food Insecurity (01/01/2022)   Hunger Vital Sign    Worried About Running Out of Food in the Last Year: Never true    Ran Out of Food in the Last Year: Never true  Transportation Needs: No Transportation Needs (01/01/2022)   PRAPARE - Hydrologist (Medical): No    Lack of Transportation (Non-Medical): No  Physical Activity: Inactive (01/01/2022)   Exercise Vital Sign    Days of Exercise per Week: 0 days    Minutes of Exercise per Session: 0 min  Stress: Stress Concern Present (01/01/2022)   [place]    Feeling of Stress : To some extent  Social Connections: Not on file    Current Medications:  Current Outpatient Medications:    acetaminophen (TYLENOL) 500 MG tablet, Take 1,000 mg by mouth every 6 (six) hours as needed for headache., Disp: , Rfl:    budesonide-formoterol (SYMBICORT) 160-4.5 MCG/ACT inhaler, INHALE 2 INHALATIONS BY  MOUTH TWICE DAILY (Patient taking differently: Inhale 2 puffs into the lungs 2 (two) times daily.), Disp: 30.6 g, Rfl: 1   diphenhydrAMINE HCl (BENADRYL ALLERGY PO), Take 1 tablet by mouth as needed (allergies)., Disp: , Rfl:    diphenhydramine-acetaminophen (TYLENOL PM) 25-500 MG TABS tablet, Take 1  tablet by mouth at bedtime as needed (pain)., Disp: , Rfl:    Phenyleph-CPM-DM-APAP (ALKA-SELTZER PLUS COLD & COUGH) 10-21-08-325 MG CAPS, Take 2 tablets by mouth every 8 (eight) hours as needed (cold/ allergies)., Disp: , Rfl:   Review of Systems: Denies appetite changes, fevers, chills, fatigue, unexplained weight changes. Denies hearing loss, neck lumps or masses, mouth sores, ringing in ears or voice changes. Denies cough or wheezing.  Denies shortness of breath. Denies chest pain or palpitations. Denies leg swelling. Denies abdominal distention, pain, blood in stools, constipation, diarrhea, nausea, vomiting, or early satiety. Denies pain with intercourse, dysuria, frequency, hematuria or incontinence. Denies hot flashes, pelvic pain, vaginal bleeding  or vaginal discharge.   Denies joint pain, back pain or muscle pain/cramps. Denies itching, rash, or wounds. Denies dizziness, headaches, numbness or seizures. Denies swollen lymph nodes or glands, denies easy bruising or bleeding. Denies anxiety, depression, confusion, or decreased concentration.  Physical Exam: BP 124/61 (BP Location: Left Arm, Patient Position: Sitting)   Pulse 85   Temp 97.8 F (36.6 C) (Tympanic)   Resp 16   Ht '5\' 1"'$  (1.549 m)   Wt 112 lb 8 oz (51 kg)   SpO2 98%   BMI 21.26 kg/m  General: Alert, oriented, no acute distress. HEENT: Normocephalic, atraumatic, sclera anicteric. Chest: Labored breathing on room air. Abdomen: soft, nontender.  Normoactive bowel sounds.  No masses or hepatosplenomegaly appreciated.  Well-healed groin incisions with mild edema at the incision.  Also some mild edema of the superior aspect of the mons. Extremities: Grossly normal range of motion.  Warm, well perfused.  No edema bilaterally. Skin: No rashes or lesions noted. Lymphatics: No cervical, supraclavicular, or inguinal adenopathy. GU: Vulvar incision is healing well.  Some granulation tissue noted along the aspect of the  incision that had opened with a small, less than 1 cm area of raised granulation tissue along the left vaginal opening at approximately 4:00.  Laboratory & Radiologic Studies: None new  Assessment & Plan: Betty Hurst is a 66 y.o. woman with Stage IB grade 1-2 SCC of the vulva who presents for follow-up.  Patient is overall doing very well and meeting the postoperative milestones.  She has had significant improvement in her lower extremity edema.  No edema seen except in the immediate area of bilateral groin incisions and her mons.  Her vulvar incision is overall healing very well.  I think she will be ready to start radiation within the next couple of weeks.  Reviewed again her pathology from surgery.  She was given a copy of her report.  Given size of tumor as well as close deep margin, adjuvant radiation recommended.  We will spare the groins and pelvis given negative lymph nodes.  I will reach out to Dr. Sondra Come regarding exam from today.  I will plan to see the patient back for follow-up after she is finished radiation.  20 minutes of total time was spent for this patient encounter, including preparation, face-to-face counseling with the patient and coordination of care, and documentation of the encounter.  Jeral Pinch, MD  Division of Gynecologic Oncology  Department of Obstetrics and Gynecology  [facility]

## 2022-01-14 ENCOUNTER — Ambulatory Visit: Payer: Medicare Other | Admitting: Radiation Oncology

## 2022-01-20 ENCOUNTER — Other Ambulatory Visit: Payer: Self-pay

## 2022-01-20 ENCOUNTER — Ambulatory Visit
Admission: RE | Admit: 2022-01-20 | Discharge: 2022-01-20 | Disposition: A | Discharge status: Home / Self Care | Payer: Medicare Other | Source: Ambulatory Visit | Attending: Radiation Oncology | Admitting: Radiation Oncology

## 2022-01-20 DIAGNOSIS — C519 Malignant neoplasm of vulva, unspecified: Secondary | ICD-10-CM | POA: Diagnosis present

## 2022-01-26 ENCOUNTER — Ambulatory Visit: Payer: Medicare Other | Admitting: Radiation Oncology

## 2022-01-27 ENCOUNTER — Ambulatory Visit: Payer: Medicare Other

## 2022-01-27 DIAGNOSIS — C519 Malignant neoplasm of vulva, unspecified: Secondary | ICD-10-CM | POA: Diagnosis not present

## 2022-01-28 ENCOUNTER — Ambulatory Visit: Payer: Medicare Other

## 2022-01-29 ENCOUNTER — Other Ambulatory Visit: Payer: Self-pay

## 2022-01-29 ENCOUNTER — Ambulatory Visit
Admission: RE | Admit: 2022-01-29 | Discharge: 2022-01-29 | Disposition: A | Discharge status: Home / Self Care | Payer: Medicare Other | Source: Ambulatory Visit | Attending: Radiation Oncology | Admitting: Radiation Oncology

## 2022-01-29 DIAGNOSIS — C519 Malignant neoplasm of vulva, unspecified: Secondary | ICD-10-CM | POA: Diagnosis not present

## 2022-01-29 DIAGNOSIS — Z510 Encounter for antineoplastic radiation therapy: Secondary | ICD-10-CM | POA: Diagnosis not present

## 2022-01-29 LAB — RAD ONC ARIA SESSION SUMMARY
Course Elapsed Days: 0
Plan Fractions Treated to Date: 1
Plan Prescribed Dose Per Fraction: 1.8 Gy
Plan Total Fractions Prescribed: 33
Plan Total Prescribed Dose: 59.4 Gy
Reference Point Dosage Given to Date: 1.8 Gy
Reference Point Session Dosage Given: 1.8 Gy
Session Number: 1

## 2022-01-30 ENCOUNTER — Ambulatory Visit
Admission: RE | Admit: 2022-01-30 | Discharge: 2022-01-30 | Disposition: A | Discharge status: Home / Self Care | Payer: Medicare Other | Source: Ambulatory Visit | Attending: Radiation Oncology | Admitting: Radiation Oncology

## 2022-01-30 ENCOUNTER — Other Ambulatory Visit: Payer: Self-pay

## 2022-01-30 DIAGNOSIS — Z510 Encounter for antineoplastic radiation therapy: Secondary | ICD-10-CM | POA: Diagnosis not present

## 2022-01-30 DIAGNOSIS — C519 Malignant neoplasm of vulva, unspecified: Secondary | ICD-10-CM | POA: Diagnosis not present

## 2022-01-30 LAB — RAD ONC ARIA SESSION SUMMARY
Course Elapsed Days: 1
Plan Fractions Treated to Date: 2
Plan Prescribed Dose Per Fraction: 1.8 Gy
Plan Total Fractions Prescribed: 33
Plan Total Prescribed Dose: 59.4 Gy
Reference Point Dosage Given to Date: 3.6 Gy
Reference Point Session Dosage Given: 1.8 Gy
Session Number: 2

## 2022-02-02 ENCOUNTER — Other Ambulatory Visit: Payer: Self-pay

## 2022-02-02 ENCOUNTER — Ambulatory Visit
Admission: RE | Admit: 2022-02-02 | Discharge: 2022-02-02 | Disposition: A | Discharge status: Home / Self Care | Payer: Medicare Other | Source: Ambulatory Visit | Attending: Radiation Oncology | Admitting: Radiation Oncology

## 2022-02-02 DIAGNOSIS — C519 Malignant neoplasm of vulva, unspecified: Secondary | ICD-10-CM | POA: Diagnosis not present

## 2022-02-02 DIAGNOSIS — Z510 Encounter for antineoplastic radiation therapy: Secondary | ICD-10-CM | POA: Diagnosis not present

## 2022-02-02 LAB — RAD ONC ARIA SESSION SUMMARY
Course Elapsed Days: 4
Plan Fractions Treated to Date: 3
Plan Prescribed Dose Per Fraction: 1.8 Gy
Plan Total Fractions Prescribed: 33
Plan Total Prescribed Dose: 59.4 Gy
Reference Point Dosage Given to Date: 5.4 Gy
Reference Point Session Dosage Given: 1.8 Gy
Session Number: 3

## 2022-02-03 ENCOUNTER — Other Ambulatory Visit: Payer: Self-pay | Admitting: Radiation Oncology

## 2022-02-03 ENCOUNTER — Ambulatory Visit
Admission: RE | Admit: 2022-02-03 | Discharge: 2022-02-03 | Disposition: A | Discharge status: Home / Self Care | Payer: Medicare Other | Source: Ambulatory Visit | Attending: Radiation Oncology | Admitting: Radiation Oncology

## 2022-02-03 ENCOUNTER — Other Ambulatory Visit: Payer: Self-pay

## 2022-02-03 ENCOUNTER — Ambulatory Visit: Payer: Medicare Other

## 2022-02-03 DIAGNOSIS — Z510 Encounter for antineoplastic radiation therapy: Secondary | ICD-10-CM | POA: Diagnosis not present

## 2022-02-03 DIAGNOSIS — C519 Malignant neoplasm of vulva, unspecified: Secondary | ICD-10-CM | POA: Diagnosis not present

## 2022-02-03 LAB — RAD ONC ARIA SESSION SUMMARY
Course Elapsed Days: 5
Plan Fractions Treated to Date: 4
Plan Prescribed Dose Per Fraction: 1.8 Gy
Plan Total Fractions Prescribed: 33
Plan Total Prescribed Dose: 59.4 Gy
Reference Point Dosage Given to Date: 7.2 Gy
Reference Point Session Dosage Given: 1.8 Gy
Session Number: 4

## 2022-02-03 MED ORDER — ONDANSETRON HCL 4 MG PO TABS: 4.0000 mg | ORAL_TABLET | Freq: Three times a day (TID) | ORAL | 0 refills | Status: DC | PRN

## 2022-02-03 NOTE — Progress Notes (Signed)
Pt here for patient teaching.  Pt given Radiation and You booklet.  Reviewed areas of pertinence such as diarrhea, fatigue, nausea and vomiting, skin changes, and urinary and bladder changes . Pt able to give teach back of to pat skin, use unscented/gentle soap, have Imodium on hand, and drink plenty of water,avoid applying anything to skin within 4 hours of treatment. Pt demonstrated understanding and verbalizes understanding of information given and will contact nursing with any questions or concerns.

## 2022-02-04 ENCOUNTER — Other Ambulatory Visit: Payer: Self-pay

## 2022-02-04 ENCOUNTER — Ambulatory Visit
Admission: RE | Admit: 2022-02-04 | Discharge: 2022-02-04 | Disposition: A | Discharge status: Home / Self Care | Payer: Medicare Other | Source: Ambulatory Visit | Attending: Radiation Oncology | Admitting: Radiation Oncology

## 2022-02-04 DIAGNOSIS — Z510 Encounter for antineoplastic radiation therapy: Secondary | ICD-10-CM | POA: Diagnosis not present

## 2022-02-04 DIAGNOSIS — C519 Malignant neoplasm of vulva, unspecified: Secondary | ICD-10-CM | POA: Diagnosis not present

## 2022-02-04 LAB — RAD ONC ARIA SESSION SUMMARY
Course Elapsed Days: 6
Plan Fractions Treated to Date: 5
Plan Prescribed Dose Per Fraction: 1.8 Gy
Plan Total Fractions Prescribed: 33
Plan Total Prescribed Dose: 59.4 Gy
Reference Point Dosage Given to Date: 9 Gy
Reference Point Session Dosage Given: 1.8 Gy
Session Number: 5

## 2022-02-05 ENCOUNTER — Other Ambulatory Visit: Payer: Self-pay

## 2022-02-05 ENCOUNTER — Ambulatory Visit
Admission: RE | Admit: 2022-02-05 | Discharge: 2022-02-05 | Disposition: A | Discharge status: Home / Self Care | Payer: Medicare Other | Source: Ambulatory Visit | Attending: Radiation Oncology | Admitting: Radiation Oncology

## 2022-02-05 DIAGNOSIS — C519 Malignant neoplasm of vulva, unspecified: Secondary | ICD-10-CM | POA: Diagnosis not present

## 2022-02-05 DIAGNOSIS — Z510 Encounter for antineoplastic radiation therapy: Secondary | ICD-10-CM | POA: Diagnosis not present

## 2022-02-05 LAB — RAD ONC ARIA SESSION SUMMARY
Course Elapsed Days: 7
Plan Fractions Treated to Date: 6
Plan Prescribed Dose Per Fraction: 1.8 Gy
Plan Total Fractions Prescribed: 33
Plan Total Prescribed Dose: 59.4 Gy
Reference Point Dosage Given to Date: 10.8 Gy
Reference Point Session Dosage Given: 1.8 Gy
Session Number: 6

## 2022-02-06 ENCOUNTER — Other Ambulatory Visit: Payer: Self-pay

## 2022-02-06 ENCOUNTER — Ambulatory Visit
Admission: RE | Admit: 2022-02-06 | Discharge: 2022-02-06 | Disposition: A | Discharge status: Home / Self Care | Payer: Medicare Other | Source: Ambulatory Visit | Attending: Radiation Oncology | Admitting: Radiation Oncology

## 2022-02-06 DIAGNOSIS — Z510 Encounter for antineoplastic radiation therapy: Secondary | ICD-10-CM | POA: Diagnosis not present

## 2022-02-06 DIAGNOSIS — C519 Malignant neoplasm of vulva, unspecified: Secondary | ICD-10-CM | POA: Diagnosis not present

## 2022-02-06 LAB — RAD ONC ARIA SESSION SUMMARY
Course Elapsed Days: 8
Plan Fractions Treated to Date: 7
Plan Prescribed Dose Per Fraction: 1.8 Gy
Plan Total Fractions Prescribed: 33
Plan Total Prescribed Dose: 59.4 Gy
Reference Point Dosage Given to Date: 12.6 Gy
Reference Point Session Dosage Given: 1.8 Gy
Session Number: 7

## 2022-02-09 ENCOUNTER — Other Ambulatory Visit: Payer: Self-pay

## 2022-02-09 ENCOUNTER — Ambulatory Visit
Admission: RE | Admit: 2022-02-09 | Discharge: 2022-02-09 | Disposition: A | Discharge status: Home / Self Care | Payer: Medicare Other | Source: Ambulatory Visit | Attending: Radiation Oncology | Admitting: Radiation Oncology

## 2022-02-09 DIAGNOSIS — C519 Malignant neoplasm of vulva, unspecified: Secondary | ICD-10-CM | POA: Diagnosis not present

## 2022-02-09 DIAGNOSIS — Z510 Encounter for antineoplastic radiation therapy: Secondary | ICD-10-CM | POA: Diagnosis not present

## 2022-02-09 LAB — RAD ONC ARIA SESSION SUMMARY
Course Elapsed Days: 11
Plan Fractions Treated to Date: 8
Plan Prescribed Dose Per Fraction: 1.8 Gy
Plan Total Fractions Prescribed: 33
Plan Total Prescribed Dose: 59.4 Gy
Reference Point Dosage Given to Date: 14.4 Gy
Reference Point Session Dosage Given: 1.8 Gy
Session Number: 8

## 2022-02-10 ENCOUNTER — Other Ambulatory Visit: Payer: Self-pay | Admitting: Radiation Oncology

## 2022-02-10 ENCOUNTER — Ambulatory Visit: Payer: Medicare Other

## 2022-02-10 ENCOUNTER — Other Ambulatory Visit: Payer: Self-pay

## 2022-02-10 ENCOUNTER — Ambulatory Visit
Admission: RE | Admit: 2022-02-10 | Discharge: 2022-02-10 | Disposition: A | Discharge status: Home / Self Care | Payer: Medicare Other | Source: Ambulatory Visit | Attending: Radiation Oncology | Admitting: Radiation Oncology

## 2022-02-10 DIAGNOSIS — C519 Malignant neoplasm of vulva, unspecified: Secondary | ICD-10-CM | POA: Diagnosis not present

## 2022-02-10 DIAGNOSIS — Z510 Encounter for antineoplastic radiation therapy: Secondary | ICD-10-CM | POA: Diagnosis not present

## 2022-02-10 LAB — RAD ONC ARIA SESSION SUMMARY
Course Elapsed Days: 12
Plan Fractions Treated to Date: 9
Plan Prescribed Dose Per Fraction: 1.8 Gy
Plan Total Fractions Prescribed: 33
Plan Total Prescribed Dose: 59.4 Gy
Reference Point Dosage Given to Date: 16.2 Gy
Reference Point Session Dosage Given: 1.8 Gy
Session Number: 9

## 2022-02-10 MED ORDER — LORAZEPAM 0.5 MG PO TABS: 0.5000 mg | ORAL_TABLET | Freq: Three times a day (TID) | ORAL | 0 refills | Status: DC

## 2022-02-11 ENCOUNTER — Other Ambulatory Visit: Payer: Self-pay

## 2022-02-11 ENCOUNTER — Ambulatory Visit
Admission: RE | Admit: 2022-02-11 | Discharge: 2022-02-11 | Disposition: A | Discharge status: Home / Self Care | Payer: Medicare Other | Source: Ambulatory Visit | Attending: Radiation Oncology | Admitting: Radiation Oncology

## 2022-02-11 DIAGNOSIS — Z510 Encounter for antineoplastic radiation therapy: Secondary | ICD-10-CM | POA: Diagnosis not present

## 2022-02-11 DIAGNOSIS — C519 Malignant neoplasm of vulva, unspecified: Secondary | ICD-10-CM | POA: Diagnosis not present

## 2022-02-11 LAB — RAD ONC ARIA SESSION SUMMARY
Course Elapsed Days: 13
Plan Fractions Treated to Date: 10
Plan Prescribed Dose Per Fraction: 1.8 Gy
Plan Total Fractions Prescribed: 33
Plan Total Prescribed Dose: 59.4 Gy
Reference Point Dosage Given to Date: 18 Gy
Reference Point Session Dosage Given: 1.8 Gy
Session Number: 10

## 2022-02-12 ENCOUNTER — Other Ambulatory Visit: Payer: Self-pay

## 2022-02-12 ENCOUNTER — Ambulatory Visit
Admission: RE | Admit: 2022-02-12 | Discharge: 2022-02-12 | Disposition: A | Discharge status: Home / Self Care | Payer: Medicare Other | Source: Ambulatory Visit | Attending: Radiation Oncology | Admitting: Radiation Oncology

## 2022-02-12 DIAGNOSIS — Z510 Encounter for antineoplastic radiation therapy: Secondary | ICD-10-CM | POA: Diagnosis not present

## 2022-02-12 DIAGNOSIS — C519 Malignant neoplasm of vulva, unspecified: Secondary | ICD-10-CM | POA: Diagnosis not present

## 2022-02-12 LAB — RAD ONC ARIA SESSION SUMMARY
Course Elapsed Days: 14
Plan Fractions Treated to Date: 11
Plan Prescribed Dose Per Fraction: 1.8 Gy
Plan Total Fractions Prescribed: 33
Plan Total Prescribed Dose: 59.4 Gy
Reference Point Dosage Given to Date: 19.8 Gy
Reference Point Session Dosage Given: 1.8 Gy
Session Number: 11

## 2022-02-13 ENCOUNTER — Other Ambulatory Visit: Payer: Self-pay

## 2022-02-13 ENCOUNTER — Ambulatory Visit
Admission: RE | Admit: 2022-02-13 | Discharge: 2022-02-13 | Disposition: A | Discharge status: Home / Self Care | Payer: Medicare Other | Source: Ambulatory Visit | Attending: Radiation Oncology | Admitting: Radiation Oncology

## 2022-02-13 DIAGNOSIS — C519 Malignant neoplasm of vulva, unspecified: Secondary | ICD-10-CM | POA: Diagnosis not present

## 2022-02-13 DIAGNOSIS — Z510 Encounter for antineoplastic radiation therapy: Secondary | ICD-10-CM | POA: Diagnosis not present

## 2022-02-13 LAB — RAD ONC ARIA SESSION SUMMARY
Course Elapsed Days: 15
Plan Fractions Treated to Date: 12
Plan Prescribed Dose Per Fraction: 1.8 Gy
Plan Total Fractions Prescribed: 33
Plan Total Prescribed Dose: 59.4 Gy
Reference Point Dosage Given to Date: 21.6 Gy
Reference Point Session Dosage Given: 1.8 Gy
Session Number: 12

## 2022-02-16 ENCOUNTER — Other Ambulatory Visit: Payer: Self-pay

## 2022-02-16 ENCOUNTER — Ambulatory Visit
Admission: RE | Admit: 2022-02-16 | Discharge: 2022-02-16 | Disposition: A | Discharge status: Home / Self Care | Payer: Medicare Other | Source: Ambulatory Visit | Attending: Radiation Oncology | Admitting: Radiation Oncology

## 2022-02-16 DIAGNOSIS — Z510 Encounter for antineoplastic radiation therapy: Secondary | ICD-10-CM | POA: Diagnosis not present

## 2022-02-16 DIAGNOSIS — C519 Malignant neoplasm of vulva, unspecified: Secondary | ICD-10-CM | POA: Diagnosis not present

## 2022-02-16 LAB — RAD ONC ARIA SESSION SUMMARY
Course Elapsed Days: 18
Plan Fractions Treated to Date: 13
Plan Prescribed Dose Per Fraction: 1.8 Gy
Plan Total Fractions Prescribed: 33
Plan Total Prescribed Dose: 59.4 Gy
Reference Point Dosage Given to Date: 23.4 Gy
Reference Point Session Dosage Given: 1.8 Gy
Session Number: 13

## 2022-02-17 ENCOUNTER — Other Ambulatory Visit: Payer: Self-pay

## 2022-02-17 ENCOUNTER — Ambulatory Visit
Admission: RE | Admit: 2022-02-17 | Discharge: 2022-02-17 | Disposition: A | Discharge status: Home / Self Care | Payer: Medicare Other | Source: Ambulatory Visit | Attending: Radiation Oncology | Admitting: Radiation Oncology

## 2022-02-17 ENCOUNTER — Other Ambulatory Visit: Payer: Self-pay | Admitting: Radiation Oncology

## 2022-02-17 DIAGNOSIS — Z510 Encounter for antineoplastic radiation therapy: Secondary | ICD-10-CM | POA: Diagnosis not present

## 2022-02-17 DIAGNOSIS — C519 Malignant neoplasm of vulva, unspecified: Secondary | ICD-10-CM | POA: Diagnosis not present

## 2022-02-17 LAB — RAD ONC ARIA SESSION SUMMARY
Course Elapsed Days: 19
Plan Fractions Treated to Date: 14
Plan Prescribed Dose Per Fraction: 1.8 Gy
Plan Total Fractions Prescribed: 33
Plan Total Prescribed Dose: 59.4 Gy
Reference Point Dosage Given to Date: 25.2 Gy
Reference Point Session Dosage Given: 1.8 Gy
Session Number: 14

## 2022-02-17 MED ORDER — TRAMADOL HCL 50 MG PO TABS: 50.0000 mg | ORAL_TABLET | Freq: Four times a day (QID) | ORAL | 0 refills | Status: DC | PRN

## 2022-02-17 MED ORDER — PHENAZOPYRIDINE HCL 200 MG PO TABS
200.0000 mg | ORAL_TABLET | Freq: Three times a day (TID) | ORAL | 1 refills | Status: DC | PRN
Start: 2022-02-17 — End: 2022-03-03

## 2022-02-18 ENCOUNTER — Other Ambulatory Visit: Payer: Self-pay

## 2022-02-18 ENCOUNTER — Ambulatory Visit
Admission: RE | Admit: 2022-02-18 | Discharge: 2022-02-18 | Disposition: A | Discharge status: Home / Self Care | Payer: Medicare Other | Source: Ambulatory Visit | Attending: Radiation Oncology | Admitting: Radiation Oncology

## 2022-02-18 DIAGNOSIS — Z510 Encounter for antineoplastic radiation therapy: Secondary | ICD-10-CM | POA: Diagnosis not present

## 2022-02-18 DIAGNOSIS — C519 Malignant neoplasm of vulva, unspecified: Secondary | ICD-10-CM | POA: Diagnosis not present

## 2022-02-18 LAB — RAD ONC ARIA SESSION SUMMARY
Course Elapsed Days: 20
Plan Fractions Treated to Date: 15
Plan Prescribed Dose Per Fraction: 1.8 Gy
Plan Total Fractions Prescribed: 33
Plan Total Prescribed Dose: 59.4 Gy
Reference Point Dosage Given to Date: 27 Gy
Reference Point Session Dosage Given: 1.8 Gy
Session Number: 15

## 2022-02-19 ENCOUNTER — Other Ambulatory Visit: Payer: Self-pay

## 2022-02-19 ENCOUNTER — Ambulatory Visit
Admission: RE | Admit: 2022-02-19 | Discharge: 2022-02-19 | Disposition: A | Discharge status: Home / Self Care | Payer: Medicare Other | Source: Ambulatory Visit | Attending: Radiation Oncology | Admitting: Radiation Oncology

## 2022-02-19 DIAGNOSIS — C519 Malignant neoplasm of vulva, unspecified: Secondary | ICD-10-CM | POA: Diagnosis not present

## 2022-02-19 DIAGNOSIS — Z510 Encounter for antineoplastic radiation therapy: Secondary | ICD-10-CM | POA: Diagnosis not present

## 2022-02-19 LAB — RAD ONC ARIA SESSION SUMMARY
Course Elapsed Days: 21
Plan Fractions Treated to Date: 16
Plan Prescribed Dose Per Fraction: 1.8 Gy
Plan Total Fractions Prescribed: 33
Plan Total Prescribed Dose: 59.4 Gy
Reference Point Dosage Given to Date: 28.8 Gy
Reference Point Session Dosage Given: 1.8 Gy
Session Number: 16

## 2022-02-20 ENCOUNTER — Other Ambulatory Visit: Payer: Self-pay

## 2022-02-20 ENCOUNTER — Ambulatory Visit
Admission: RE | Admit: 2022-02-20 | Discharge: 2022-02-20 | Disposition: A | Discharge status: Home / Self Care | Payer: Medicare Other | Source: Ambulatory Visit | Attending: Radiation Oncology | Admitting: Radiation Oncology

## 2022-02-20 DIAGNOSIS — C519 Malignant neoplasm of vulva, unspecified: Secondary | ICD-10-CM | POA: Diagnosis present

## 2022-02-20 DIAGNOSIS — Z510 Encounter for antineoplastic radiation therapy: Secondary | ICD-10-CM | POA: Diagnosis not present

## 2022-02-20 LAB — RAD ONC ARIA SESSION SUMMARY
Course Elapsed Days: 22
Plan Fractions Treated to Date: 17
Plan Prescribed Dose Per Fraction: 1.8 Gy
Plan Total Fractions Prescribed: 33
Plan Total Prescribed Dose: 59.4 Gy
Reference Point Dosage Given to Date: 30.6 Gy
Reference Point Session Dosage Given: 1.8 Gy
Session Number: 17

## 2022-02-24 ENCOUNTER — Other Ambulatory Visit: Payer: Self-pay

## 2022-02-24 ENCOUNTER — Ambulatory Visit
Admission: RE | Admit: 2022-02-24 | Discharge: 2022-02-24 | Disposition: A | Discharge status: Home / Self Care | Payer: Medicare Other | Source: Ambulatory Visit | Attending: Radiation Oncology | Admitting: Radiation Oncology

## 2022-02-24 DIAGNOSIS — Z510 Encounter for antineoplastic radiation therapy: Secondary | ICD-10-CM | POA: Diagnosis not present

## 2022-02-24 DIAGNOSIS — C519 Malignant neoplasm of vulva, unspecified: Secondary | ICD-10-CM | POA: Diagnosis not present

## 2022-02-24 LAB — RAD ONC ARIA SESSION SUMMARY
Course Elapsed Days: 26
Plan Fractions Treated to Date: 18
Plan Prescribed Dose Per Fraction: 1.8 Gy
Plan Total Fractions Prescribed: 33
Plan Total Prescribed Dose: 59.4 Gy
Reference Point Dosage Given to Date: 32.4 Gy
Reference Point Session Dosage Given: 1.8 Gy
Session Number: 18

## 2022-02-25 ENCOUNTER — Ambulatory Visit
Admission: RE | Admit: 2022-02-25 | Discharge: 2022-02-25 | Disposition: A | Discharge status: Home / Self Care | Payer: Medicare Other | Source: Ambulatory Visit | Attending: Radiation Oncology | Admitting: Radiation Oncology

## 2022-02-25 ENCOUNTER — Other Ambulatory Visit: Payer: Self-pay

## 2022-02-25 DIAGNOSIS — Z510 Encounter for antineoplastic radiation therapy: Secondary | ICD-10-CM | POA: Diagnosis not present

## 2022-02-25 DIAGNOSIS — C519 Malignant neoplasm of vulva, unspecified: Secondary | ICD-10-CM | POA: Diagnosis not present

## 2022-02-25 LAB — RAD ONC ARIA SESSION SUMMARY
Course Elapsed Days: 27
Plan Fractions Treated to Date: 19
Plan Prescribed Dose Per Fraction: 1.8 Gy
Plan Total Fractions Prescribed: 33
Plan Total Prescribed Dose: 59.4 Gy
Reference Point Dosage Given to Date: 34.2 Gy
Reference Point Session Dosage Given: 1.8 Gy
Session Number: 19

## 2022-02-26 ENCOUNTER — Other Ambulatory Visit: Payer: Self-pay

## 2022-02-26 ENCOUNTER — Encounter: Payer: Medicare Other | Admitting: Primary Care

## 2022-02-26 ENCOUNTER — Ambulatory Visit
Admission: RE | Admit: 2022-02-26 | Discharge: 2022-02-26 | Disposition: A | Discharge status: Home / Self Care | Payer: Medicare Other | Source: Ambulatory Visit | Attending: Radiation Oncology | Admitting: Radiation Oncology

## 2022-02-26 DIAGNOSIS — Z510 Encounter for antineoplastic radiation therapy: Secondary | ICD-10-CM | POA: Diagnosis not present

## 2022-02-26 DIAGNOSIS — C519 Malignant neoplasm of vulva, unspecified: Secondary | ICD-10-CM | POA: Diagnosis not present

## 2022-02-26 LAB — RAD ONC ARIA SESSION SUMMARY
Course Elapsed Days: 28
Plan Fractions Treated to Date: 20
Plan Prescribed Dose Per Fraction: 1.8 Gy
Plan Total Fractions Prescribed: 33
Plan Total Prescribed Dose: 59.4 Gy
Reference Point Dosage Given to Date: 36 Gy
Reference Point Session Dosage Given: 1.8 Gy
Session Number: 20

## 2022-02-27 ENCOUNTER — Other Ambulatory Visit: Payer: Self-pay

## 2022-02-27 ENCOUNTER — Ambulatory Visit
Admission: RE | Admit: 2022-02-27 | Discharge: 2022-02-27 | Disposition: A | Discharge status: Home / Self Care | Payer: Medicare Other | Source: Ambulatory Visit | Attending: Radiation Oncology | Admitting: Radiation Oncology

## 2022-02-27 DIAGNOSIS — Z510 Encounter for antineoplastic radiation therapy: Secondary | ICD-10-CM | POA: Diagnosis not present

## 2022-02-27 DIAGNOSIS — C519 Malignant neoplasm of vulva, unspecified: Secondary | ICD-10-CM | POA: Diagnosis not present

## 2022-02-27 LAB — RAD ONC ARIA SESSION SUMMARY
Course Elapsed Days: 29
Plan Fractions Treated to Date: 21
Plan Prescribed Dose Per Fraction: 1.8 Gy
Plan Total Fractions Prescribed: 33
Plan Total Prescribed Dose: 59.4 Gy
Reference Point Dosage Given to Date: 37.8 Gy
Reference Point Session Dosage Given: 1.8 Gy
Session Number: 21

## 2022-03-02 ENCOUNTER — Other Ambulatory Visit: Payer: Self-pay

## 2022-03-02 ENCOUNTER — Ambulatory Visit
Admission: RE | Admit: 2022-03-02 | Discharge: 2022-03-02 | Disposition: A | Discharge status: Home / Self Care | Payer: Medicare Other | Source: Ambulatory Visit | Attending: Radiation Oncology | Admitting: Radiation Oncology

## 2022-03-02 DIAGNOSIS — Z510 Encounter for antineoplastic radiation therapy: Secondary | ICD-10-CM | POA: Diagnosis not present

## 2022-03-02 DIAGNOSIS — C519 Malignant neoplasm of vulva, unspecified: Secondary | ICD-10-CM | POA: Diagnosis not present

## 2022-03-02 LAB — RAD ONC ARIA SESSION SUMMARY
Course Elapsed Days: 32
Plan Fractions Treated to Date: 22
Plan Prescribed Dose Per Fraction: 1.8 Gy
Plan Total Fractions Prescribed: 33
Plan Total Prescribed Dose: 59.4 Gy
Reference Point Dosage Given to Date: 39.6 Gy
Reference Point Session Dosage Given: 1.8 Gy
Session Number: 22

## 2022-03-03 ENCOUNTER — Other Ambulatory Visit: Payer: Self-pay | Admitting: Radiation Oncology

## 2022-03-03 ENCOUNTER — Other Ambulatory Visit: Payer: Self-pay

## 2022-03-03 ENCOUNTER — Ambulatory Visit
Admission: RE | Admit: 2022-03-03 | Discharge: 2022-03-03 | Disposition: A | Discharge status: Home / Self Care | Payer: Medicare Other | Source: Ambulatory Visit | Attending: Radiation Oncology | Admitting: Radiation Oncology

## 2022-03-03 DIAGNOSIS — C519 Malignant neoplasm of vulva, unspecified: Secondary | ICD-10-CM | POA: Diagnosis not present

## 2022-03-03 DIAGNOSIS — Z510 Encounter for antineoplastic radiation therapy: Secondary | ICD-10-CM | POA: Diagnosis not present

## 2022-03-03 LAB — RAD ONC ARIA SESSION SUMMARY
Course Elapsed Days: 33
Plan Fractions Treated to Date: 23
Plan Prescribed Dose Per Fraction: 1.8 Gy
Plan Total Fractions Prescribed: 33
Plan Total Prescribed Dose: 59.4 Gy
Reference Point Dosage Given to Date: 41.4 Gy
Reference Point Session Dosage Given: 1.8 Gy
Session Number: 23

## 2022-03-03 MED ORDER — SILVER SULFADIAZINE 1 % EX CREA: TOPICAL_CREAM | Freq: Every day | CUTANEOUS | Status: DC

## 2022-03-03 MED ORDER — ONDANSETRON HCL 4 MG PO TABS: 4.0000 mg | ORAL_TABLET | Freq: Three times a day (TID) | ORAL | 1 refills | Status: DC | PRN

## 2022-03-03 MED ORDER — TRAMADOL HCL 50 MG PO TABS: 50.0000 mg | ORAL_TABLET | Freq: Four times a day (QID) | ORAL | 0 refills | Status: DC | PRN

## 2022-03-04 ENCOUNTER — Other Ambulatory Visit: Payer: Self-pay

## 2022-03-04 ENCOUNTER — Ambulatory Visit
Admission: RE | Admit: 2022-03-04 | Discharge: 2022-03-04 | Disposition: A | Discharge status: Home / Self Care | Payer: Medicare Other | Source: Ambulatory Visit | Attending: Radiation Oncology | Admitting: Radiation Oncology

## 2022-03-04 DIAGNOSIS — C519 Malignant neoplasm of vulva, unspecified: Secondary | ICD-10-CM | POA: Diagnosis not present

## 2022-03-04 DIAGNOSIS — Z510 Encounter for antineoplastic radiation therapy: Secondary | ICD-10-CM | POA: Diagnosis not present

## 2022-03-04 LAB — RAD ONC ARIA SESSION SUMMARY
Course Elapsed Days: 34
Plan Fractions Treated to Date: 24
Plan Prescribed Dose Per Fraction: 1.8 Gy
Plan Total Fractions Prescribed: 33
Plan Total Prescribed Dose: 59.4 Gy
Reference Point Dosage Given to Date: 43.2 Gy
Reference Point Session Dosage Given: 1.8 Gy
Session Number: 24

## 2022-03-05 ENCOUNTER — Ambulatory Visit
Admission: RE | Admit: 2022-03-05 | Discharge: 2022-03-05 | Disposition: A | Discharge status: Home / Self Care | Payer: Medicare Other | Source: Ambulatory Visit | Attending: Radiation Oncology | Admitting: Radiation Oncology

## 2022-03-05 ENCOUNTER — Other Ambulatory Visit: Payer: Self-pay

## 2022-03-05 DIAGNOSIS — Z510 Encounter for antineoplastic radiation therapy: Secondary | ICD-10-CM | POA: Diagnosis not present

## 2022-03-05 DIAGNOSIS — C519 Malignant neoplasm of vulva, unspecified: Secondary | ICD-10-CM | POA: Diagnosis not present

## 2022-03-05 LAB — RAD ONC ARIA SESSION SUMMARY
Course Elapsed Days: 35
Plan Fractions Treated to Date: 25
Plan Prescribed Dose Per Fraction: 1.8 Gy
Plan Total Fractions Prescribed: 33
Plan Total Prescribed Dose: 59.4 Gy
Reference Point Dosage Given to Date: 45 Gy
Reference Point Session Dosage Given: 1.8 Gy
Session Number: 25

## 2022-03-06 ENCOUNTER — Ambulatory Visit
Admission: RE | Admit: 2022-03-06 | Discharge: 2022-03-06 | Disposition: A | Discharge status: Home / Self Care | Payer: Medicare Other | Source: Ambulatory Visit | Attending: Radiation Oncology | Admitting: Radiation Oncology

## 2022-03-06 ENCOUNTER — Other Ambulatory Visit: Payer: Self-pay

## 2022-03-06 DIAGNOSIS — Z510 Encounter for antineoplastic radiation therapy: Secondary | ICD-10-CM | POA: Diagnosis not present

## 2022-03-06 DIAGNOSIS — C519 Malignant neoplasm of vulva, unspecified: Secondary | ICD-10-CM | POA: Diagnosis not present

## 2022-03-06 LAB — RAD ONC ARIA SESSION SUMMARY
Course Elapsed Days: 36
Plan Fractions Treated to Date: 26
Plan Prescribed Dose Per Fraction: 1.8 Gy
Plan Total Fractions Prescribed: 33
Plan Total Prescribed Dose: 59.4 Gy
Reference Point Dosage Given to Date: 46.8 Gy
Reference Point Session Dosage Given: 1.8 Gy
Session Number: 26

## 2022-03-07 ENCOUNTER — Other Ambulatory Visit: Payer: Self-pay | Admitting: Primary Care

## 2022-03-07 DIAGNOSIS — J439 Emphysema, unspecified: Secondary | ICD-10-CM

## 2022-03-09 ENCOUNTER — Other Ambulatory Visit: Payer: Self-pay

## 2022-03-09 ENCOUNTER — Ambulatory Visit: Payer: Medicare Other

## 2022-03-09 ENCOUNTER — Ambulatory Visit
Admission: RE | Admit: 2022-03-09 | Discharge: 2022-03-09 | Disposition: A | Discharge status: Home / Self Care | Payer: Medicare Other | Source: Ambulatory Visit | Attending: Radiation Oncology | Admitting: Radiation Oncology

## 2022-03-09 ENCOUNTER — Other Ambulatory Visit: Payer: Self-pay | Admitting: Radiation Oncology

## 2022-03-09 DIAGNOSIS — Z510 Encounter for antineoplastic radiation therapy: Secondary | ICD-10-CM | POA: Diagnosis not present

## 2022-03-09 DIAGNOSIS — C519 Malignant neoplasm of vulva, unspecified: Secondary | ICD-10-CM | POA: Diagnosis not present

## 2022-03-09 LAB — RAD ONC ARIA SESSION SUMMARY
Course Elapsed Days: 39
Plan Fractions Treated to Date: 27
Plan Prescribed Dose Per Fraction: 1.8 Gy
Plan Total Fractions Prescribed: 33
Plan Total Prescribed Dose: 59.4 Gy
Reference Point Dosage Given to Date: 48.6 Gy
Reference Point Session Dosage Given: 1.8 Gy
Session Number: 27

## 2022-03-09 MED ORDER — ONDANSETRON HCL 4 MG PO TABS: 4.0000 mg | ORAL_TABLET | Freq: Three times a day (TID) | ORAL | 1 refills | Status: DC | PRN

## 2022-03-09 MED ORDER — TRAMADOL HCL 50 MG PO TABS: 50.0000 mg | ORAL_TABLET | Freq: Four times a day (QID) | ORAL | 0 refills | Status: DC | PRN

## 2022-03-10 ENCOUNTER — Ambulatory Visit: Payer: Medicare Other

## 2022-03-10 ENCOUNTER — Other Ambulatory Visit: Payer: Self-pay

## 2022-03-10 ENCOUNTER — Ambulatory Visit
Admission: RE | Admit: 2022-03-10 | Discharge: 2022-03-10 | Disposition: A | Discharge status: Home / Self Care | Payer: Medicare Other | Source: Ambulatory Visit | Attending: Radiation Oncology | Admitting: Radiation Oncology

## 2022-03-10 DIAGNOSIS — C519 Malignant neoplasm of vulva, unspecified: Secondary | ICD-10-CM | POA: Diagnosis not present

## 2022-03-10 DIAGNOSIS — Z510 Encounter for antineoplastic radiation therapy: Secondary | ICD-10-CM | POA: Diagnosis not present

## 2022-03-10 LAB — RAD ONC ARIA SESSION SUMMARY
Course Elapsed Days: 40
Plan Fractions Treated to Date: 28
Plan Prescribed Dose Per Fraction: 1.8 Gy
Plan Total Fractions Prescribed: 33
Plan Total Prescribed Dose: 59.4 Gy
Reference Point Dosage Given to Date: 50.4 Gy
Reference Point Session Dosage Given: 1.8 Gy
Session Number: 28

## 2022-03-10 MED ORDER — SILVER SULFADIAZINE 1 % EX CREA: TOPICAL_CREAM | Freq: Every day | CUTANEOUS | Status: DC

## 2022-03-11 ENCOUNTER — Other Ambulatory Visit: Payer: Self-pay

## 2022-03-11 ENCOUNTER — Ambulatory Visit
Admission: RE | Admit: 2022-03-11 | Discharge: 2022-03-11 | Disposition: A | Discharge status: Home / Self Care | Payer: Medicare Other | Source: Ambulatory Visit | Attending: Radiation Oncology | Admitting: Radiation Oncology

## 2022-03-11 DIAGNOSIS — Z510 Encounter for antineoplastic radiation therapy: Secondary | ICD-10-CM | POA: Diagnosis not present

## 2022-03-11 DIAGNOSIS — C519 Malignant neoplasm of vulva, unspecified: Secondary | ICD-10-CM | POA: Diagnosis not present

## 2022-03-11 LAB — RAD ONC ARIA SESSION SUMMARY
Course Elapsed Days: 41
Plan Fractions Treated to Date: 29
Plan Prescribed Dose Per Fraction: 1.8 Gy
Plan Total Fractions Prescribed: 33
Plan Total Prescribed Dose: 59.4 Gy
Reference Point Dosage Given to Date: 52.2 Gy
Reference Point Session Dosage Given: 1.8 Gy
Session Number: 29

## 2022-03-12 ENCOUNTER — Ambulatory Visit
Admission: RE | Admit: 2022-03-12 | Discharge: 2022-03-12 | Disposition: A | Discharge status: Home / Self Care | Payer: Medicare Other | Source: Ambulatory Visit | Attending: Radiation Oncology | Admitting: Radiation Oncology

## 2022-03-12 ENCOUNTER — Other Ambulatory Visit: Payer: Self-pay

## 2022-03-12 ENCOUNTER — Ambulatory Visit: Payer: Medicare Other

## 2022-03-12 DIAGNOSIS — Z510 Encounter for antineoplastic radiation therapy: Secondary | ICD-10-CM | POA: Diagnosis not present

## 2022-03-12 DIAGNOSIS — C519 Malignant neoplasm of vulva, unspecified: Secondary | ICD-10-CM | POA: Diagnosis not present

## 2022-03-12 LAB — RAD ONC ARIA SESSION SUMMARY
Course Elapsed Days: 42
Plan Fractions Treated to Date: 30
Plan Prescribed Dose Per Fraction: 1.8 Gy
Plan Total Fractions Prescribed: 33
Plan Total Prescribed Dose: 59.4 Gy
Reference Point Dosage Given to Date: 54 Gy
Reference Point Session Dosage Given: 1.8 Gy
Session Number: 30

## 2022-03-13 ENCOUNTER — Other Ambulatory Visit: Payer: Self-pay

## 2022-03-13 ENCOUNTER — Ambulatory Visit
Admission: RE | Admit: 2022-03-13 | Discharge: 2022-03-13 | Disposition: A | Discharge status: Home / Self Care | Payer: Medicare Other | Source: Ambulatory Visit | Attending: Radiation Oncology | Admitting: Radiation Oncology

## 2022-03-13 DIAGNOSIS — C519 Malignant neoplasm of vulva, unspecified: Secondary | ICD-10-CM | POA: Diagnosis not present

## 2022-03-13 DIAGNOSIS — Z510 Encounter for antineoplastic radiation therapy: Secondary | ICD-10-CM | POA: Diagnosis not present

## 2022-03-13 LAB — RAD ONC ARIA SESSION SUMMARY
Course Elapsed Days: 43
Plan Fractions Treated to Date: 31
Plan Prescribed Dose Per Fraction: 1.8 Gy
Plan Total Fractions Prescribed: 33
Plan Total Prescribed Dose: 59.4 Gy
Reference Point Dosage Given to Date: 55.8 Gy
Reference Point Session Dosage Given: 1.8 Gy
Session Number: 31

## 2022-03-16 ENCOUNTER — Ambulatory Visit
Admission: RE | Admit: 2022-03-16 | Discharge: 2022-03-16 | Disposition: A | Discharge status: Home / Self Care | Payer: Medicare Other | Source: Ambulatory Visit | Attending: Radiation Oncology | Admitting: Radiation Oncology

## 2022-03-16 ENCOUNTER — Other Ambulatory Visit: Payer: Self-pay

## 2022-03-16 DIAGNOSIS — C519 Malignant neoplasm of vulva, unspecified: Secondary | ICD-10-CM | POA: Diagnosis not present

## 2022-03-16 DIAGNOSIS — Z510 Encounter for antineoplastic radiation therapy: Secondary | ICD-10-CM | POA: Diagnosis not present

## 2022-03-16 LAB — RAD ONC ARIA SESSION SUMMARY
Course Elapsed Days: 46
Plan Fractions Treated to Date: 32
Plan Prescribed Dose Per Fraction: 1.8 Gy
Plan Total Fractions Prescribed: 33
Plan Total Prescribed Dose: 59.4 Gy
Reference Point Dosage Given to Date: 57.6 Gy
Reference Point Session Dosage Given: 1.8 Gy
Session Number: 32

## 2022-03-17 ENCOUNTER — Encounter: Payer: Self-pay | Admitting: Radiation Oncology

## 2022-03-17 ENCOUNTER — Ambulatory Visit: Payer: Medicare Other

## 2022-03-17 ENCOUNTER — Other Ambulatory Visit: Payer: Self-pay

## 2022-03-17 ENCOUNTER — Other Ambulatory Visit: Payer: Self-pay | Admitting: Radiation Oncology

## 2022-03-17 ENCOUNTER — Ambulatory Visit
Admission: RE | Admit: 2022-03-17 | Discharge: 2022-03-17 | Disposition: A | Discharge status: Home / Self Care | Payer: Medicare Other | Source: Ambulatory Visit | Attending: Radiation Oncology | Admitting: Radiation Oncology

## 2022-03-17 DIAGNOSIS — C519 Malignant neoplasm of vulva, unspecified: Secondary | ICD-10-CM | POA: Diagnosis not present

## 2022-03-17 DIAGNOSIS — Z510 Encounter for antineoplastic radiation therapy: Secondary | ICD-10-CM | POA: Diagnosis not present

## 2022-03-17 LAB — RAD ONC ARIA SESSION SUMMARY
Course Elapsed Days: 47
Plan Fractions Treated to Date: 33
Plan Prescribed Dose Per Fraction: 1.8 Gy
Plan Total Fractions Prescribed: 33
Plan Total Prescribed Dose: 59.4 Gy
Reference Point Dosage Given to Date: 59.4 Gy
Reference Point Session Dosage Given: 1.8 Gy
Session Number: 33

## 2022-03-17 MED ORDER — TRAMADOL HCL 50 MG PO TABS: 50.0000 mg | ORAL_TABLET | Freq: Four times a day (QID) | ORAL | 0 refills | Status: DC | PRN

## 2022-03-17 MED ORDER — ONDANSETRON HCL 4 MG PO TABS: 4.0000 mg | ORAL_TABLET | Freq: Three times a day (TID) | ORAL | 1 refills | Status: DC | PRN

## 2022-03-17 MED ORDER — SILVER SULFADIAZINE 1 % EX CREA: TOPICAL_CREAM | Freq: Every day | CUTANEOUS | Status: DC

## 2022-03-24 ENCOUNTER — Encounter: Payer: Self-pay | Admitting: Primary Care

## 2022-03-24 ENCOUNTER — Ambulatory Visit (INDEPENDENT_AMBULATORY_CARE_PROVIDER_SITE_OTHER): Payer: Medicare Other | Admitting: Primary Care

## 2022-03-24 VITALS — BP 124/58 | HR 88 | Temp 97.8°F | Ht 61.0 in | Wt 112.0 lb

## 2022-03-24 DIAGNOSIS — C519 Malignant neoplasm of vulva, unspecified: Secondary | ICD-10-CM

## 2022-03-24 DIAGNOSIS — J439 Emphysema, unspecified: Secondary | ICD-10-CM | POA: Diagnosis not present

## 2022-03-24 DIAGNOSIS — Z0001 Encounter for general adult medical examination with abnormal findings: Secondary | ICD-10-CM | POA: Diagnosis not present

## 2022-03-24 DIAGNOSIS — Z1231 Encounter for screening mammogram for malignant neoplasm of breast: Secondary | ICD-10-CM | POA: Diagnosis not present

## 2022-03-24 DIAGNOSIS — G43709 Chronic migraine without aura, not intractable, without status migrainosus: Secondary | ICD-10-CM

## 2022-03-24 DIAGNOSIS — F419 Anxiety disorder, unspecified: Secondary | ICD-10-CM

## 2022-03-24 DIAGNOSIS — R739 Hyperglycemia, unspecified: Secondary | ICD-10-CM

## 2022-03-24 MED ORDER — ALBUTEROL SULFATE HFA 108 (90 BASE) MCG/ACT IN AERS: 2.0000 | INHALATION_SPRAY | RESPIRATORY_TRACT | 0 refills | Status: DC | PRN

## 2022-03-24 MED ORDER — ESCITALOPRAM OXALATE 10 MG PO TABS: 10.0000 mg | ORAL_TABLET | Freq: Every day | ORAL | 0 refills | Status: DC

## 2022-03-24 NOTE — Assessment & Plan Note (Signed)
Controlled.  Continue Symbicort 160-4.5 mcg, 2 puffs BID. Rx provided for albuterol inhaler PRN as she does not have one on hand.

## 2022-03-24 NOTE — Assessment & Plan Note (Signed)
Immunizations UTD. Declines influenza vaccine. Mammogram due, orders placed. Colonoscopy due, patient declines and wants to defer until 2024.   Exam stable. Labs pending.  Follow up in 1 year for repeat physical.

## 2022-03-24 NOTE — Assessment & Plan Note (Signed)
Following with neurology. Plan is to resume Botox. She will call to follow up on on scheduling.

## 2022-03-24 NOTE — Assessment & Plan Note (Signed)
Uncontrolled, agreeable to treatment.  Start Lexapro 10 mg.   Patient is to take 1/2 tablet daily for 8 days, then advance to 1 full tablet thereafter. We discussed possible side effects of headache, GI upset, drowsiness, and SI/HI. If thoughts of SI/HI develop, we discussed to present to the emergency immediately. Patient verbalized understanding.   Follow up in 6 weeks for re-evaluation.

## 2022-03-24 NOTE — Assessment & Plan Note (Addendum)
Following with GYN/ONC and radiology oncology, office notes reviewed from GYN/ONC July 2023. Completed last radiation treatment on 03/17/2022.

## 2022-03-24 NOTE — Progress Notes (Signed)
Subjective:    Patient ID: KADESHIA KASPARIAN, female    DOB: Jan 05, 1956, 66 y.o.   MRN: [id number]  HPI  ELLOISE ROARK is a very pleasant 66 y.o. female who presents today for complete physical and follow up of chronic conditions.  Chronic history of anxiety. Over the last year she has been under a tremendous amount of stress with her husbands cancer and death, and her diagnosis of vulvar cancer. Symptoms include difficulty sleeping, worrying, feeling overwhelmed, feeling anxious. She was provided Rx for lorazepam from her radiation oncologist which caused her to feel awful. She would like treatment for her anxiety. She was once manage on Zoloft years ago, didn't notice improvement in symptoms.   Immunizations: -Tetanus: 2010 -Influenza: Allergy  -Shingles: Never had chicken pox -Pneumonia: 2016   Diet: [place].  Exercise: No regular exercise.  Eye exam: Completes annually  Dental exam: Completed this year.  Pap Smear: Hysterectomy  Mammogram: Completed in August 2022.  Colonoscopy: Completed in 2013, due 2023, declines this year.    BP Readings from Last 3 Encounters:  03/24/22 (!) 124/58  01/01/22 124/61  12/17/21 140/68        Review of Systems  Constitutional:  Negative for unexpected weight change.  HENT:  Negative for rhinorrhea.   Respiratory:  Negative for cough and shortness of breath.   Cardiovascular:  Negative for chest pain.  Gastrointestinal:  Negative for constipation and diarrhea.       GERD  Genitourinary:  Negative for difficulty urinating.       Vulvar pain  Musculoskeletal:  Negative for arthralgias and myalgias.  Skin:  Negative for rash.  Allergic/Immunologic: Negative for environmental allergies.  Neurological:  Positive for headaches. Negative for dizziness.  Psychiatric/Behavioral:  The patient is nervous/anxious.        See HPI         Past Medical History:  Diagnosis Date   Acute non-recurrent sinusitis 10/13/2018   Anemia     Asthma    simbicort, no rescue inhaler use   Basal cell carcinoma of skin    on forehead and temple, and left side of nose   Complication of anesthesia    Frequent sinus infections    Headache    Migraine    PONV (postoperative nausea and vomiting)    Small bowel obstruction ([place])    Surgery in 2006   Vulvar cancer [facility])     Social History   Socioeconomic History   Marital status: Widowed    Spouse name: Not on file   Number of children: 2   Years of education: 2 years college   Highest education level: Not on file  Occupational History   Not on file  Tobacco Use   Smoking status: Every Day    Packs/day: 0.25    Types: Cigarettes   Smokeless tobacco: Never   Tobacco comments:    Patient has cut back "a lot" since her husband has been sick.  Vaping Use   Vaping Use: Never used  Substance and Sexual Activity   Alcohol use: Not Currently    Comment: daily   Drug use: No   Sexual activity: Yes    Partners: Male  Other Topics Concern   Not on file  Social History Narrative   Married.   Lives in [place] with her husband.   2 daughters, 4 grandchildren.   On disability from work.   Enjoys being outside, gardening, volunteers at [employer].   Caffeine:  diet coke 16-24 oz per day   Social Determinants of Health   Financial Resource Strain: Low Risk  (01/01/2022)   Overall Financial Resource Strain (CARDIA)    Difficulty of Paying Living Expenses: Not hard at all  Food Insecurity: No Food Insecurity (01/01/2022)   Hunger Vital Sign    Worried About Running Out of Food in the Last Year: Never true    Ran Out of Food in the Last Year: Never true  Transportation Needs: No Transportation Needs (01/01/2022)   PRAPARE - Hydrologist (Medical): No    Lack of Transportation (Non-Medical): No  Physical Activity: Inactive (01/01/2022)   Exercise Vital Sign    Days of Exercise per Week: 0 days    Minutes of Exercise per Session: 0 min   Stress: Stress Concern Present (01/01/2022)   [place]    Feeling of Stress : To some extent  Social Connections: Not on file  Intimate Partner Violence: Not on file    Past Surgical History:  Procedure Laterality Date   ABDOMINAL HYSTERECTOMY  1989   thinks had BSO   COLPOSCOPY N/A 11/11/2021   Procedure: COLPOSCOPY OF CERVIX WITH POSSIBLE BIOPSIES;  Surgeon: Lafonda Mosses, MD;  Location: [facility];  Service: Gynecology;  Laterality: N/A;   HEMORROIDECTOMY     INGUINAL LYMPHADENECTOMY Bilateral 11/11/2021   Procedure: INGUINAL LYMPHADENECTOMY;  Surgeon: Lafonda Mosses, MD;  Location: [facility];  Service: Gynecology;  Laterality: Bilateral;   NASAL SINUS SURGERY     14 surgeries   OTHER SURGICAL HISTORY     Bladder sling   RADICAL VULVECTOMY N/A 11/11/2021   Procedure: PARTIAL RADICAL VULVECTOMY;  Surgeon: Lafonda Mosses, MD;  Location: [facility];  Service: Gynecology;  Laterality: N/A;   SKIN BIOPSY     basal cell on face   SMALL INTESTINE SURGERY     Small bowel obstruction in 2006   TONSILLECTOMY     vulvectomy      Family History  Problem Relation Age of Onset   Cancer Father    Stroke Father        Deceased   Colon cancer Father    Pancreatic cancer Maternal Uncle    Kidney disease Maternal Grandfather    Stroke Paternal Grandmother    Renal Disease Paternal Grandfather    Heart failure Paternal Grandfather    Heart attack Paternal Grandfather    Breast cancer Niece    Migraines Neg Hx     Allergies  Allergen Reactions   Influenza Vaccines Hives   Morphine Hives and Swelling   Amitriptyline Rash   Cefuroxime Axetil Rash   Lorazepam Palpitations   Pyridium [Phenazopyridine Hcl] Palpitations    Felt jittery    Current Outpatient Medications on File Prior to Visit  Medication Sig Dispense Refill   acetaminophen (TYLENOL) 500 MG tablet Take 1,000 mg by mouth every 6 (six) hours as  needed for headache.     budesonide-formoterol (SYMBICORT) 160-4.5 MCG/ACT inhaler Inhale 2 puffs into the lungs 2 (two) times daily. Office visit required for further refills. 10.2 g 0   diphenhydrAMINE HCl (BENADRYL ALLERGY PO) Take 1 tablet by mouth as needed (allergies).     diphenhydramine-acetaminophen (TYLENOL PM) 25-500 MG TABS tablet Take 1 tablet by mouth at bedtime as needed (pain).     ondansetron (ZOFRAN) 4 MG tablet Take 1 tablet (4 mg total) by mouth every 8 (eight) hours as  needed for nausea or vomiting. 20 tablet 1   Phenyleph-CPM-DM-APAP (ALKA-SELTZER PLUS COLD & COUGH) 10-21-08-325 MG CAPS Take 2 tablets by mouth every 8 (eight) hours as needed (cold/ allergies).     traMADol (ULTRAM) 50 MG tablet Take 1 tablet (50 mg total) by mouth every 6 (six) hours as needed for moderate pain. 30 tablet 0   No current facility-administered medications on file prior to visit.    BP (!) 124/58   Pulse 88   Temp 97.8 F (36.6 C) (Temporal)   Ht '5\' 1"'$  (1.549 m)   Wt 112 lb (50.8 kg)   SpO2 99%   BMI 21.16 kg/m  Objective:   Physical Exam HENT:     Right Ear: Tympanic membrane and ear canal normal.     Left Ear: Tympanic membrane and ear canal normal.     Nose: Nose normal.  Eyes:     Conjunctiva/sclera: Conjunctivae normal.     Pupils: Pupils are equal, round, and reactive to light.  Neck:     Thyroid: No thyromegaly.  Cardiovascular:     Rate and Rhythm: Normal rate and regular rhythm.     Heart sounds: No murmur heard. Pulmonary:     Effort: Pulmonary effort is normal.     Breath sounds: Normal breath sounds. No rales.  Abdominal:     General: Bowel sounds are normal.     Palpations: Abdomen is soft.     Tenderness: There is no abdominal tenderness.  Musculoskeletal:        General: Normal range of motion.     Cervical back: Neck supple.  Lymphadenopathy:     Cervical: No cervical adenopathy.  Skin:    General: Skin is warm and dry.     Findings: No rash.   Neurological:     Mental Status: She is alert and oriented to person, place, and time.     Cranial Nerves: No cranial nerve deficit.     Deep Tendon Reflexes: Reflexes are normal and symmetric.  Psychiatric:        Mood and Affect: Mood normal.           Assessment & Plan:   Problem List Items Addressed This Visit       Cardiovascular and Mediastinum   Migraines    Following with neurology. Plan is to resume Botox. She will call to follow up on on scheduling.       Relevant Medications   escitalopram (LEXAPRO) 10 MG tablet     Respiratory   Pulmonary emphysema (HCC)    Controlled.  Continue Symbicort 160-4.5 mcg, 2 puffs BID. Rx provided for albuterol inhaler PRN as she does not have one on hand.       Relevant Medications   albuterol (VENTOLIN HFA) 108 (90 Base) MCG/ACT inhaler     Genitourinary   Vulvar cancer [facility])    Following with GYN/ONC and radiology oncology, office notes reviewed from GYN/ONC July 2023. Completed last radiation treatment on 03/17/2022.          Other   Encounter for annual general medical examination with abnormal findings in adult    Immunizations UTD. Declines influenza vaccine. Mammogram due, orders placed. Colonoscopy due, patient declines and wants to defer until 2024.   Exam stable. Labs pending.  Follow up in 1 year for repeat physical.       Anxiety    Uncontrolled, agreeable to treatment.  Start Lexapro 10 mg.   Patient is to take 1/2  tablet daily for 8 days, then advance to 1 full tablet thereafter. We discussed possible side effects of headache, GI upset, drowsiness, and SI/HI. If thoughts of SI/HI develop, we discussed to present to the emergency immediately. Patient verbalized understanding.   Follow up in 6 weeks for re-evaluation.        Relevant Medications   escitalopram (LEXAPRO) 10 MG tablet   Other Visit Diagnoses     Encounter for screening mammogram for malignant neoplasm of breast    -   Primary   Relevant Orders   MM 3D SCREEN BREAST BILATERAL   Hyperglycemia       Relevant Orders   Hemoglobin A1c   Lipid panel          [name], NP

## 2022-03-24 NOTE — Patient Instructions (Addendum)
Start escitalopram (Lexapro) 10  mg for anxiety and depression. Take 1/2 tablet by mouth once daily for about one week, then increase to 1 full tablet thereafter.   Stop by the lab prior to leaving today. I will notify you of your results once received.   [facility] to schedule your mamm[facility].   Please schedule a follow up visit for 6 weeks for follow up of anxiety. This can be virtual or in person.  It was a pleasure to see you today!  Preventive Care 78-62 Years Old, Female Preventive care refers to lifestyle choices and visits with your health care provider that can promote health and wellness. Preventive care visits are also called wellness exams. What can I expect for my preventive care visit? Counseling Your health care provider may ask you questions about your: Medical history, including: Past medical problems. Family medical history. Pregnancy history. Current health, including: Menstrual cycle. Method of birth control. Emotional well-being. Home life and relationship well-being. Sexual activity and sexual health. Lifestyle, including: Alcohol, nicotine or tobacco, and drug use. Access to firearms. Diet, exercise, and sleep habits. Work and work Statistician. Sunscreen use. Safety issues such as seatbelt and bike helmet use. Physical exam Your health care provider will check your: Height and weight. These may be used to calculate your BMI (body mass index). BMI is a measurement that tells if you are at a healthy weight. Waist circumference. This measures the distance around your waistline. This measurement also tells if you are at a healthy weight and may help predict your risk of certain diseases, such as type 2 diabetes and high blood pressure. Heart rate and blood pressure. Body temperature. Skin for abnormal spots. What immunizations do I need?  Vaccines are usually given at various ages, according to a schedule. Your health care provider will  recommend vaccines for you based on your age, medical history, and lifestyle or other factors, such as travel or where you work. What tests do I need? Screening Your health care provider may recommend screening tests for certain conditions. This may include: Lipid and cholesterol levels. Diabetes screening. This is done by checking your blood sugar (glucose) after you have not eaten for a while (fasting). Pelvic exam and Pap test. Hepatitis B test. Hepatitis C test. HIV (human immunodeficiency virus) test. STI (sexually transmitted infection) testing, if you are at risk. Lung cancer screening. Colorectal cancer screening. Mammogram. Talk with your health care provider about when you should start having regular mammograms. This may depend on whether you have a family history of breast cancer. BRCA-related cancer screening. This may be done if you have a family history of breast, ovarian, tubal, or peritoneal cancers. Bone density scan. This is done to screen for osteoporosis. Talk with your health care provider about your test results, treatment options, and if necessary, the need for more tests. Follow these instructions at home: Eating and drinking  Eat a diet that includes fresh fruits and vegetables, whole grains, lean protein, and low-fat dairy products. Take vitamin and mineral supplements as recommended by your health care provider. Do not drink alcohol if: Your health care provider tells you not to drink. You are pregnant, may be pregnant, or are planning to become pregnant. If you drink alcohol: Limit how much you have to 0-1 drink a day. Know how much alcohol is in your drink. In the [place] one drink equals one 12 oz bottle of beer (355 mL), one 5 oz glass of wine (148 mL), or  one 1 oz glass of hard liquor (44 mL). Lifestyle Brush your teeth every morning and night with fluoride toothpaste. Floss one time each day. Exercise for at least 30 minutes 5 or more days each week. Do  not use any products that contain nicotine or tobacco. These products include cigarettes, chewing tobacco, and vaping devices, such as e-cigarettes. If you need help quitting, ask your health care provider. Do not use drugs. If you are sexually active, practice safe sex. Use a condom or other form of protection to prevent STIs. If you do not wish to become pregnant, use a form of birth control. If you plan to become pregnant, see your health care provider for a prepregnancy visit. Take aspirin only as told by your health care provider. Make sure that you understand how much to take and what form to take. Work with your health care provider to find out whether it is safe and beneficial for you to take aspirin daily. Find healthy ways to manage stress, such as: Meditation, yoga, or listening to music. Journaling. Talking to a trusted person. Spending time with friends and family. Minimize exposure to UV radiation to reduce your risk of skin cancer. Safety Always wear your seat belt while driving or riding in a vehicle. Do not drive: If you have been drinking alcohol. Do not ride with someone who has been drinking. When you are tired or distracted. While texting. If you have been using any mind-altering substances or drugs. Wear a helmet and other protective equipment during sports activities. If you have firearms in your house, make sure you follow all gun safety procedures. Seek help if you have been physically or sexually abused. What's next? Visit your health care provider once a year for an annual wellness visit. Ask your health care provider how often you should have your eyes and teeth checked. Stay up to date on all vaccines. This information is not intended to replace advice given to you by your health care provider. Make sure you discuss any questions you have with your health care provider. Document Revised: 12/04/2020 Document Reviewed: 12/04/2020 Elsevier Patient Education  [place].

## 2022-03-25 LAB — LIPID PANEL
Cholesterol: 170 mg/dL (ref 0–200)
HDL: 98.6 mg/dL (ref >39.00)
LDL Cholesterol: 60 mg/dL (ref 0–99)
NonHDL: 71.44
Total CHOL/HDL Ratio: 2
Triglycerides: 58 mg/dL (ref 0.0–149.0)
VLDL: 11.6 mg/dL (ref 0.0–40.0)

## 2022-03-25 LAB — HEMOGLOBIN A1C: Hgb A1c MFr Bld: 5.3 % (ref 4.6–6.5)

## 2022-03-26 ENCOUNTER — Encounter: Payer: Self-pay | Admitting: Radiation Oncology

## 2022-03-27 ENCOUNTER — Telehealth: Payer: Self-pay | Admitting: Oncology

## 2022-03-27 ENCOUNTER — Other Ambulatory Visit: Payer: Self-pay | Admitting: Radiation Oncology

## 2022-03-27 DIAGNOSIS — C519 Malignant neoplasm of vulva, unspecified: Secondary | ICD-10-CM

## 2022-03-27 MED ORDER — TRAMADOL HCL 50 MG PO TABS: 50.0000 mg | ORAL_TABLET | Freq: Four times a day (QID) | ORAL | 0 refills | Status: DC | PRN

## 2022-03-27 NOTE — Telephone Encounter (Signed)
Left a message that Tramadol refill has been sent to [facility].  Also advised her that she should have a refill available for Zofran.

## 2022-04-15 ENCOUNTER — Telehealth: Payer: Self-pay | Admitting: *Deleted

## 2022-04-15 ENCOUNTER — Encounter: Payer: Self-pay | Admitting: Radiation Oncology

## 2022-04-15 NOTE — Telephone Encounter (Signed)
RETURNED PATIENT'S PHONE CALL, SPOKE WITH PATIENT. ?

## 2022-04-20 ENCOUNTER — Ambulatory Visit: Payer: Self-pay | Admitting: Radiation Oncology

## 2022-04-22 NOTE — Progress Notes (Incomplete)
  Radiation Oncology         [phone] ________________________________  Patient Name: Betty Hurst MRN: [id number] DOB: 03-28-1956 Referring Physician: Jeral Pinch Date of Service: 03/17/2022 [facility], [place]                                                        End Of Treatment Note  Diagnoses: C51.9-Malignant neoplasm of vulva, unspecified  Cancer Staging: The encounter diagnosis was Vulvar cancer ([place]).   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 On the date of her final treatment, the patient reported ongoing pain to the vulvar area rated at a 7/10, fatigue, itching, nausea (relieved by zofran), dysuria, and ongoing vaginal spotting. Her discomfort in the treatment area improved over the last few days of treatment.  She also noticed less swelling in the external genitalia, and she consistently used Silvadene on areas of the skin that are broken down.  I refilled her tramadol, Silvadene, and Zofran on the date of her final treatment.   Plan: The patient will follow-up with radiation oncology in one month .  ________________________________________________ -----------------------------------  Blair Promise, PhD, MD  This document serves as a record of services personally performed by Gery Pray, MD. It was created on his behalf by Roney Mans, a trained medical scribe. The creation of this record is based on the scribe's personal observations and the provider's statements to them. This document has been checked and approved by the attending provider.

## 2022-04-22 NOTE — Progress Notes (Signed)
Radiation Oncology         [phone] ________________________________  Name: Betty Hurst MRN: [id number]  Date: 04/23/2022  DOB: Sep 26, 1955  Follow-Up Visit Note  CC: [name], NP  Lafonda Mosses, MD  No diagnosis found.  Diagnosis:   The encounter diagnosis was Vulvar cancer ([place]).   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 The patient tolerated radiation therapy relatively well. On the date of her final treatment, the patient reported ongoing pain to the vulvar area rated at a 7/10, fatigue, itching, nausea (relieved by zofran), dysuria, and ongoing vaginal spotting. Her discomfort in the treatment area improved over the last few days of treatment.  She also noticed less swelling in the external genitalia, and she consistently used Silvadene on areas of the skin that are broken down.  I refilled her tramadol, Silvadene, and Zofran on the date of her final treatment.                Prior to initiating RT, the patient followed up with Dr. Berline Lopes on 01/01/22. During which time, the patient was noted to be feeling well and reported a significant decrease in edema in legs. She did note some mild swelling around her groin incisions but denied any other concerns.   Otherwise, no significant interval history since the patient completed radiation therapy.   ***                   Allergies:  is allergic to influenza vaccines, morphine, amitriptyline, cefuroxime axetil, lorazepam, and pyridium [phenazopyridine hcl].  Meds: Current Outpatient Medications  Medication Sig Dispense  Refill   acetaminophen (TYLENOL) 500 MG tablet Take 1,000 mg by mouth every 6 (six) hours as needed for headache.     albuterol (VENTOLIN HFA) 108 (90 Base) MCG/ACT inhaler Inhale 2 puffs into the lungs every 4 (four) hours as needed for shortness of breath. 1 each 0   budesonide-formoterol (SYMBICORT) 160-4.5 MCG/ACT inhaler Inhale 2 puffs into the lungs 2 (two) times daily. Office visit required for further refills. 10.2 g 0   diphenhydrAMINE HCl (BENADRYL ALLERGY PO) Take 1 tablet by mouth as needed (allergies).     diphenhydramine-acetaminophen (TYLENOL PM) 25-500 MG TABS tablet Take 1 tablet by mouth at bedtime as needed (pain).     escitalopram (LEXAPRO) 10 MG tablet Take 1 tablet (10 mg total) by mouth daily. For anxiety. 90 tablet 0   ondansetron (ZOFRAN) 4 MG tablet Take 1 tablet (4 mg total) by mouth every 8 (eight) hours as needed for nausea or vomiting. 20 tablet 1   Phenyleph-CPM-DM-APAP (ALKA-SELTZER PLUS COLD & COUGH) 10-21-08-325 MG CAPS Take 2 tablets by mouth every 8 (eight) hours as needed (cold/ allergies).     traMADol (ULTRAM) 50 MG tablet Take 1 tablet (50 mg total) by mouth every 6 (six) hours as needed for moderate pain. 30 tablet 0   No current facility-administered medications for this encounter.    Physical Findings: The patient is in no acute distress. Patient is alert and oriented.  vitals were not taken for this visit. Marland Kitchen  No significant changes. Lungs are clear to auscultation bilaterally. Heart has regular rate and rhythm. No palpable cervical, supraclavicular, or axillary adenopathy. Abdomen soft, non-tender, normal bowel sounds.   Lab Findings: Lab Results  Component Value Date   WBC 6.4 12/06/2021   HGB 11.5 (L) 12/06/2021   HCT 33.8 (L) 12/06/2021   MCV 116.2 (H) 12/06/2021   PLT 186 12/06/2021    Radiographic Findings: No results found.  Impression:   The encounter diagnosis was Vulvar cancer ([place]).   Stage IB grade 1-2 Invasive well to moderately  differentiated squamous cell carcinoma of the vulva; p16 positive; HPV positive    The patient is recovering from the effects of radiation.  ***  Plan:  ***   *** minutes of total time was spent for this patient encounter, including preparation, face-to-face counseling with the patient and coordination of care, physical exam, and documentation of the encounter. ____________________________________  Blair Promise, PhD, MD  This document serves as a record of services personally performed by Gery Pray, MD. It was created on his behalf by Roney Mans, a trained medical scribe. The creation of this record is based on the scribe's personal observations and the provider's statements to them. This document has been checked and approved by the attending provider.

## 2022-04-23 ENCOUNTER — Other Ambulatory Visit: Payer: Self-pay

## 2022-04-23 ENCOUNTER — Ambulatory Visit
Admission: RE | Admit: 2022-04-23 | Discharge: 2022-04-23 | Disposition: A | Discharge status: Home / Self Care | Payer: Medicare Other | Source: Ambulatory Visit | Attending: Radiation Oncology | Admitting: Radiation Oncology

## 2022-04-23 ENCOUNTER — Encounter: Payer: Self-pay | Admitting: Radiation Oncology

## 2022-04-23 VITALS — BP 142/60 | HR 82 | Temp 97.7°F | Resp 18 | Ht 60.0 in | Wt 111.4 lb

## 2022-04-23 DIAGNOSIS — R3911 Hesitancy of micturition: Secondary | ICD-10-CM | POA: Diagnosis not present

## 2022-04-23 DIAGNOSIS — C519 Malignant neoplasm of vulva, unspecified: Secondary | ICD-10-CM

## 2022-04-23 DIAGNOSIS — Z923 Personal history of irradiation: Secondary | ICD-10-CM | POA: Insufficient documentation

## 2022-04-23 DIAGNOSIS — Z7951 Long term (current) use of inhaled steroids: Secondary | ICD-10-CM | POA: Diagnosis not present

## 2022-04-23 NOTE — Progress Notes (Addendum)
Betty Hurst is here today for follow up post radiation to the pelvic.  They completed their radiation on: 03/17/22  Does the patient complain of any of the following:  Pain:No Abdominal bloating: Yes Diarrhea/Constipation: No Nausea/Vomiting: nausea which is relieved with zofran.  Vaginal Discharge: Yes  Blood in Urine or Stool: No Urinary Issues (dysuria/incomplete emptying/ incontinence/ increased frequency/urgency): Patient reports dysuria and incomplete bladder emptying.  Does patient report using vaginal dilator 2-3 times a week and/or sexually active 2-3 weeks: Patient given xs, xs+ and S vaginal dilators with instructions and importance of use. Patient voiced understanding.  Post radiation skin changes: No, skin has improved.    Additional comments if applicable:  BP (!) 847/84 (BP Location: Right Arm, Patient Position: Sitting, Cuff Size: Normal)   Pulse 82   Temp 97.7 F (36.5 C)   Resp 18   Ht 5' (1.524 m)   Wt 111 lb 6.4 oz (50.5 kg)   SpO2 100%   BMI 21.76 kg/m

## 2022-04-29 ENCOUNTER — Ambulatory Visit
Admission: RE | Admit: 2022-04-29 | Discharge: 2022-04-29 | Disposition: A | Discharge status: Home / Self Care | Payer: Medicare Other | Source: Ambulatory Visit | Attending: Primary Care | Admitting: Primary Care

## 2022-04-29 DIAGNOSIS — Z1231 Encounter for screening mammogram for malignant neoplasm of breast: Secondary | ICD-10-CM

## 2022-04-29 DIAGNOSIS — D0439 Carcinoma in situ of skin of other parts of face: Secondary | ICD-10-CM | POA: Diagnosis not present

## 2022-04-29 DIAGNOSIS — L570 Actinic keratosis: Secondary | ICD-10-CM | POA: Diagnosis not present

## 2022-05-05 ENCOUNTER — Telehealth: Payer: Medicare Other | Admitting: Primary Care

## 2022-05-11 ENCOUNTER — Other Ambulatory Visit: Payer: Self-pay | Admitting: Primary Care

## 2022-05-11 DIAGNOSIS — J439 Emphysema, unspecified: Secondary | ICD-10-CM

## 2022-09-21 ENCOUNTER — Encounter: Payer: Self-pay | Admitting: Radiation Oncology

## 2022-09-21 NOTE — Telephone Encounter (Signed)
Please have her scheduled to be seen for evaluation.

## 2022-09-22 ENCOUNTER — Telehealth: Payer: Self-pay | Admitting: *Deleted

## 2022-09-22 NOTE — Telephone Encounter (Signed)
CALLED PATIENT TO ASK ABOUT RESCHEDULING FU ON 09-24-22 DUE TO HER HAVING COVID, LVM FOR A RETURN CALL

## 2022-09-22 NOTE — Telephone Encounter (Signed)
Left message for patient to call back and schedule appt

## 2022-09-24 ENCOUNTER — Ambulatory Visit: Payer: Medicare Other | Admitting: Radiation Oncology

## 2022-10-09 IMAGING — CT CT ABD-PELV W/ CM
2 of 5 series · 15 of 46 positions shown, 17 images · IV contrast (agent unspecified)
Comparison: PET-CT 10/16/2021. [HOSPITAL] CT
Abdomen and Pelvis 11/11/2004.

CLINICAL DATA: 65-year-old female with recent vulvectomy and
inguinal lymph node dissection for vulvar cancer. Purulent discharge
from postoperative drain.

EXAM:
CT ABDOMEN AND PELVIS WITH CONTRAST
TECHNIQUE: Multidetector CT imaging of the abdomen and pelvis was performed
using the standard protocol following bolus administration of
intravenous contrast.

[Series 2: axial st · axial · 0.78mm/px · z∈[+1050,+1430]mm · 12 of 90 slices shown, 14 images]
[im 7/90  soft-tissue]
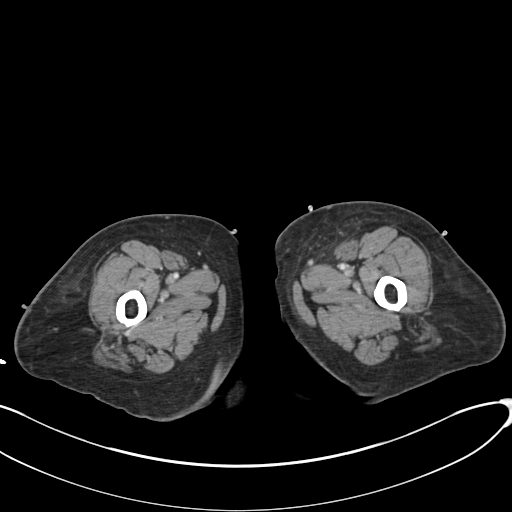
[im 7/90  bone]
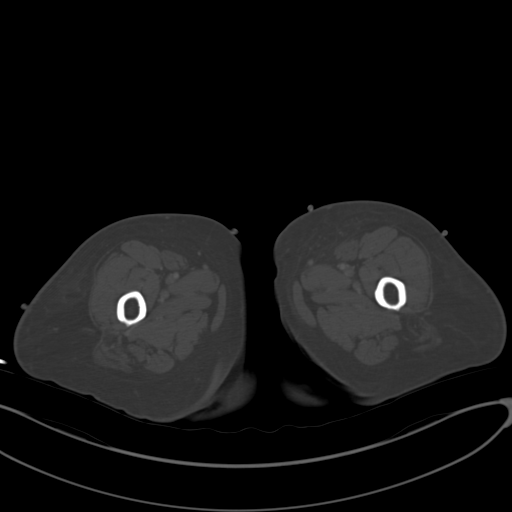
[im 14/90  soft-tissue]
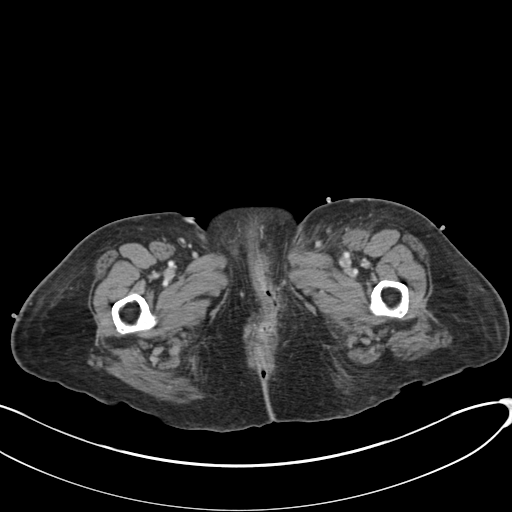
[im 21/90  soft-tissue]
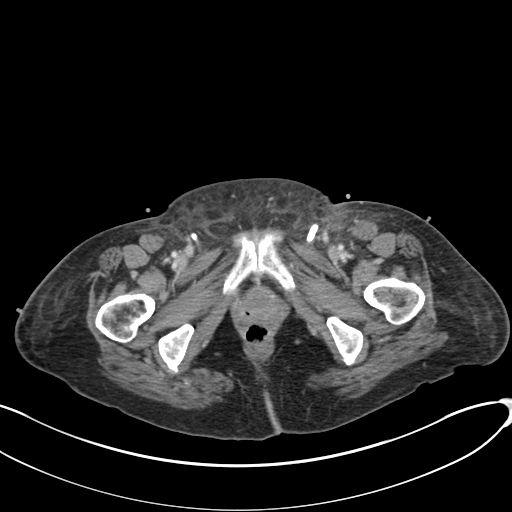
[im 28/90  soft-tissue]
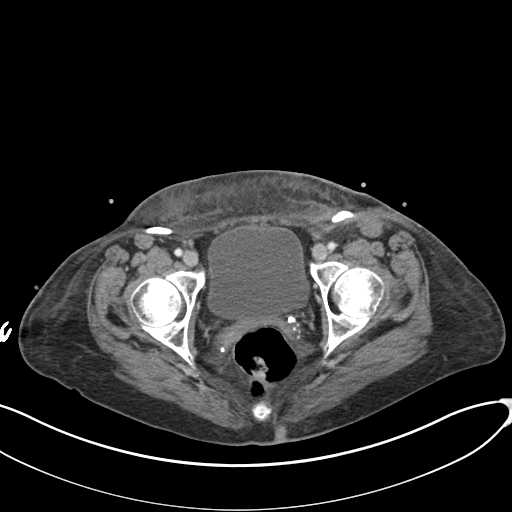
[im 35/90  soft-tissue]
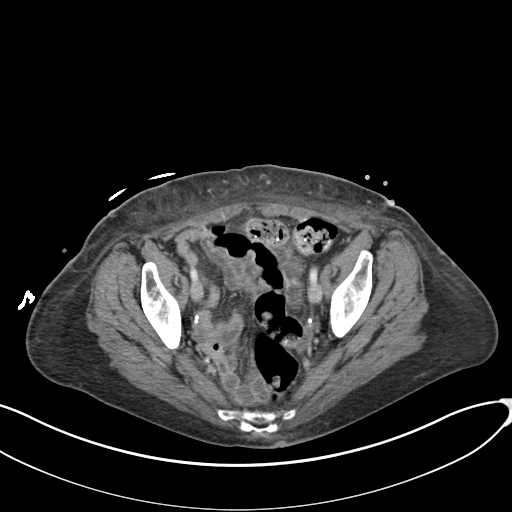
[im 42/90  soft-tissue]
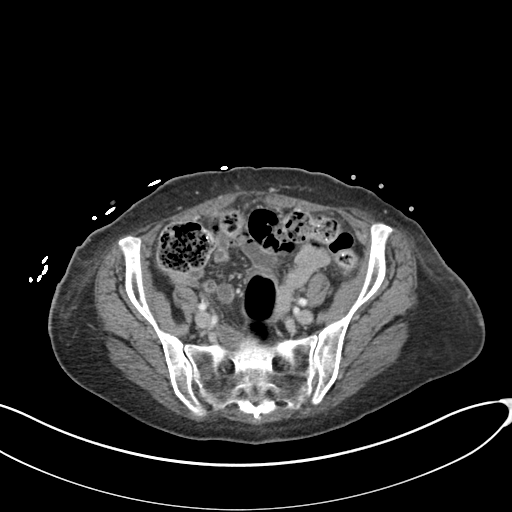
[im 48/90  soft-tissue]
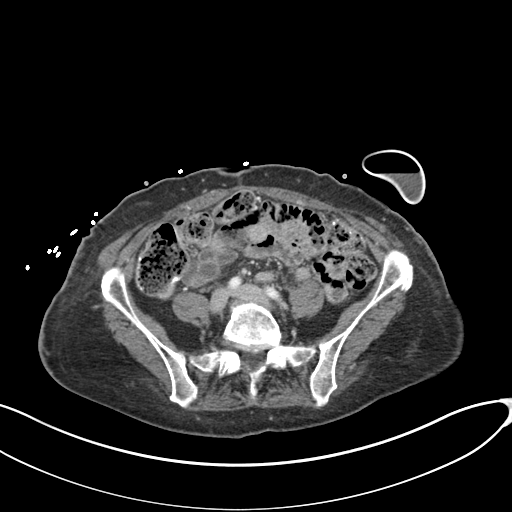
[im 55/90  soft-tissue]
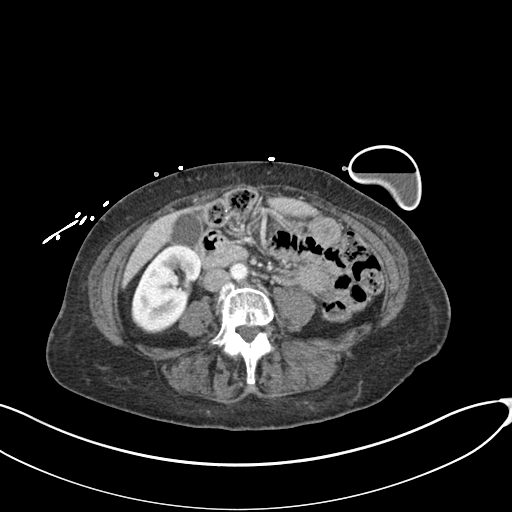
[im 62/90  soft-tissue]
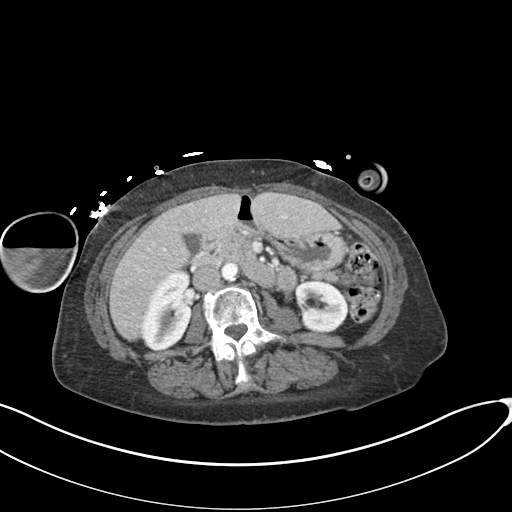
[im 62/90  bone]
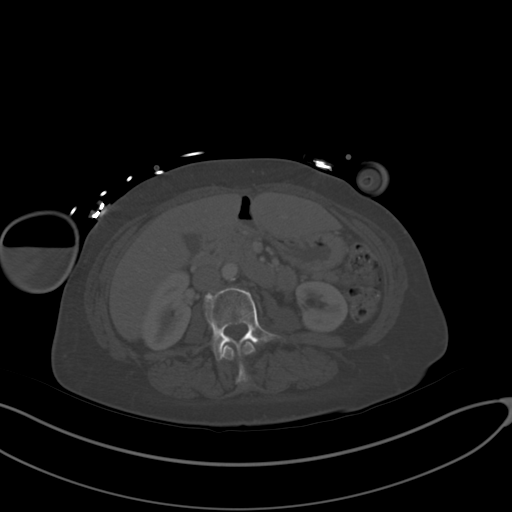
[im 69/90  soft-tissue]
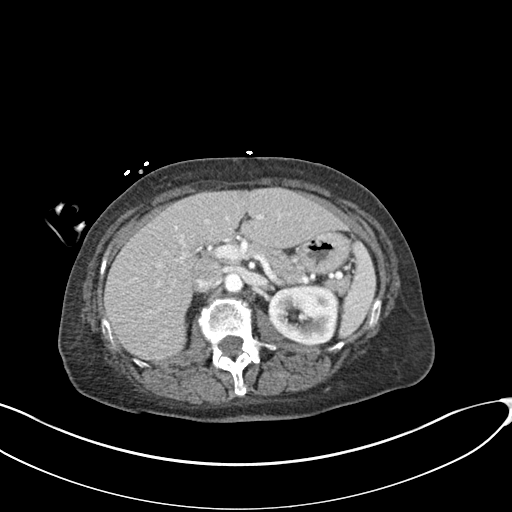
[im 76/90  soft-tissue]
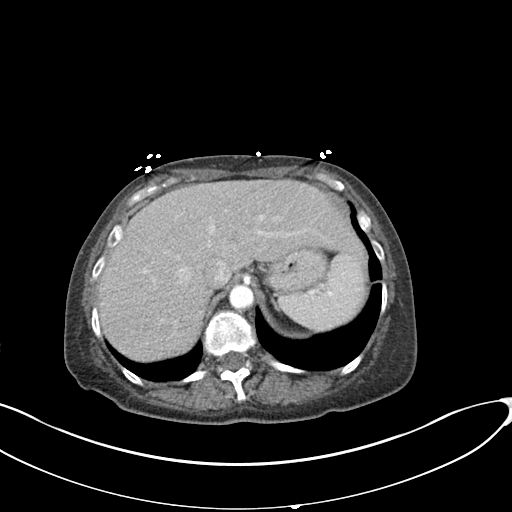
[im 83/90  soft-tissue]
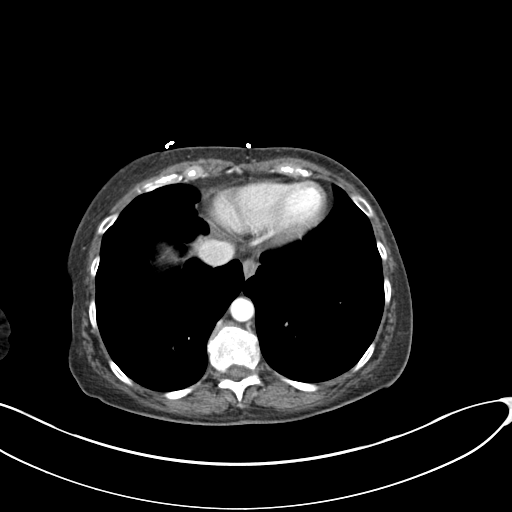

[Series 5: coronal st · coronal · 0.78mm/px · 3 of 120 slices shown]
[im 40/120  soft-tissue]
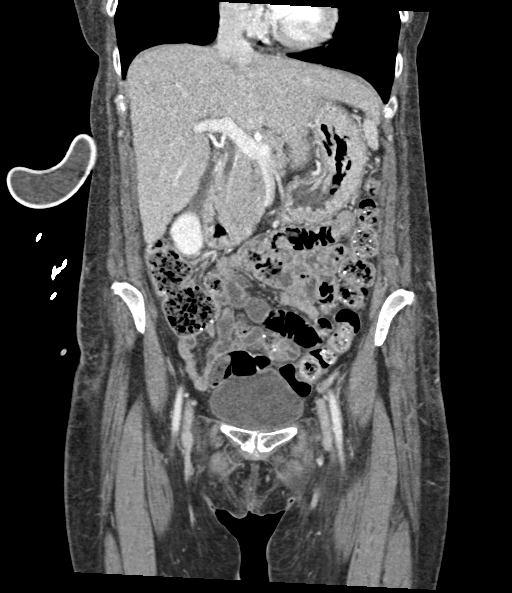
[im 53/120  soft-tissue]
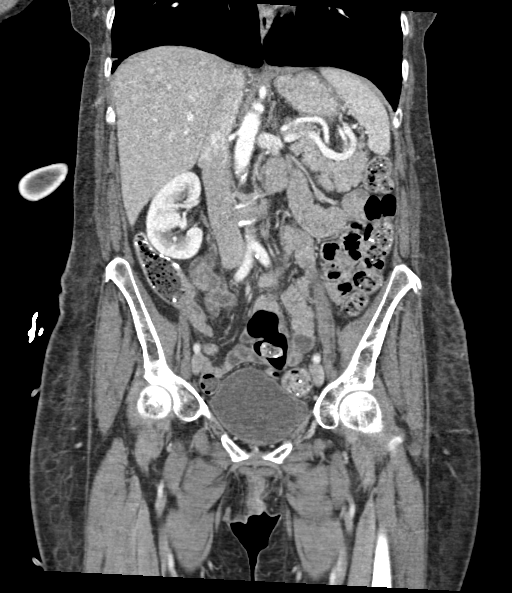
[im 67/120  soft-tissue]
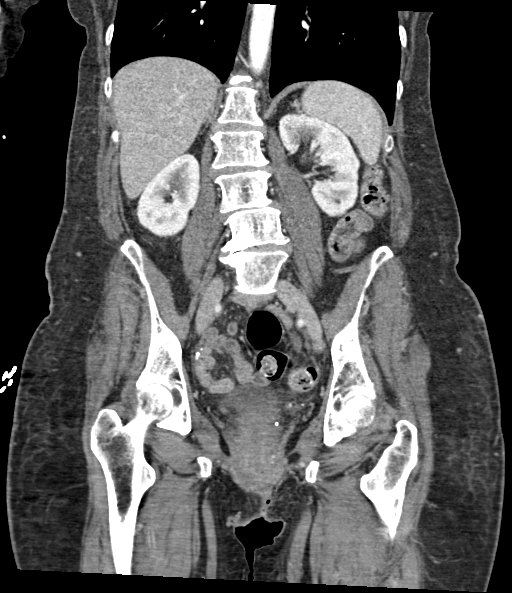

[15 of 46 positions shown; findings below may reference images not displayed]

RADIATION DOSE REDUCTION: This exam was performed according to the
departmental dose-optimization program which includes automated
exposure control, adjustment of the mA and/or kV according to
patient size and/or use of iterative reconstruction technique.

CONTRAST:  100mL OMNIPAQUE IOHEXOL 300 MG/ML  SOLN
FINDINGS: Lower chest: No cardiomegaly, pericardial effusion, pleural
effusion. Mild lung base scarring, including in the right middle
lobe, appears postinflammatory.

Hepatobiliary: No suspicious liver mass. There are 2 tiny
low-density areas in the liver parenchyma which are too small to
characterize (e.g. Series 2, image 20) but probably benign cysts.
Negative gallbladder.

Pancreas: Negative.

Spleen: Negative.

Adrenals/Urinary Tract: Normal adrenal glands. Symmetric renal
enhancement and contrast excretion. Numerous pelvic phleboliths.
Unremarkable urinary bladder.

Stomach/Bowel: Rectum and anal verge appear within normal limits.
Retained gas and stool throughout the colon. Redundant transverse
colon. Evidence of prior appendectomy or partial right colectomy.
Unclear whether there is a neo terminal ileum. No large bowel
inflammation identified. No dilated small bowel. Decompressed
stomach and duodenum. No free air or free fluid.

Vascular/Lymphatic: Aortoiliac calcified atherosclerosis. Major
arterial structures in the abdomen and pelvis are patent. Bilateral
inguinal region percutaneous operative drains are further detailed
below. No malignant lymphadenopathy is identified. Portal venous
system appears patent.

Reproductive: Labia postoperative changes. On the left a small
volume of soft tissue gas tracks toward the pelvic floor (series 2,
image 76), but is not unexpected following surgery. No regional
adverse features identified.

Chronically absent uterus.  Diminutive or absent ovaries.

Other: No pelvic free fluid.

There are bilateral inguinal percutaneous surgical drains in place.
There is regional subcutaneous inflammatory stranding, including
throughout the ventral lower abdominal wall. But no soft tissue gas
or fluid collection. No drainable fluid identified along the course
of either drain.

Musculoskeletal: Lumbar spine degeneration and scoliosis. No acute
or suspicious osseous lesion identified.
IMPRESSION: 1. Postoperative changes to the labia and bilateral inguinal
percutaneous surgical drains in place. But no unexpected
postoperative findings. No fluid collection or other adverse
features identified.

2. No superimposed acute or metastatic process identified in the
abdomen or pelvis.

3. Aortic Atherosclerosis ([id number]).

## 2022-10-21 IMAGING — CT CT ABD-PELV W/ CM
2 of 5 series · 14 of 46 positions shown, 16 images · IV contrast (APPLIED)
Comparison: 11/24/2021
COMPARISON: 11/24/2021

Addendum:
CLINICAL DATA: Abdominal pain postoperatively.  Drain fell out.

EXAM:
CT ABDOMEN AND PELVIS WITH CONTRAST
TECHNIQUE: Multidetector CT imaging of the abdomen and pelvis was performed
using the standard protocol following bolus administration of
intravenous contrast.

[Series 2: axial st · axial · 0.82mm/px · z∈[+1544,+1869]mm · 11 of 77 slices shown, 13 images]
[im 6/77  soft-tissue]
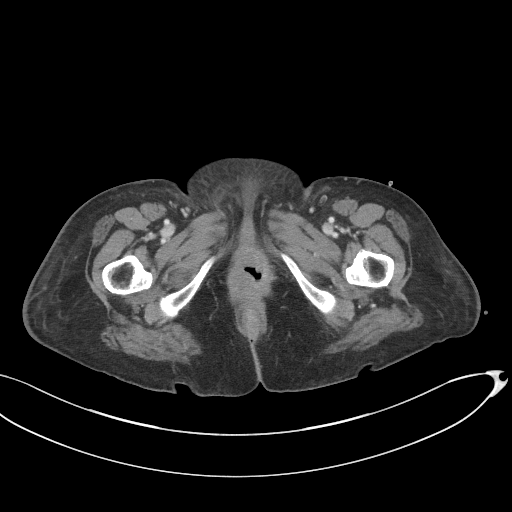
[im 6/77  bone]
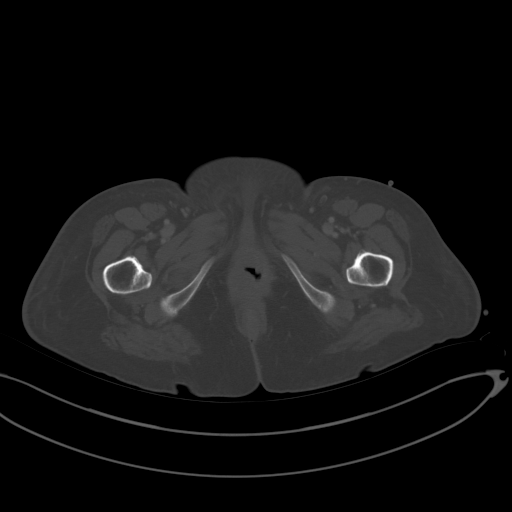
[im 12/77  soft-tissue]
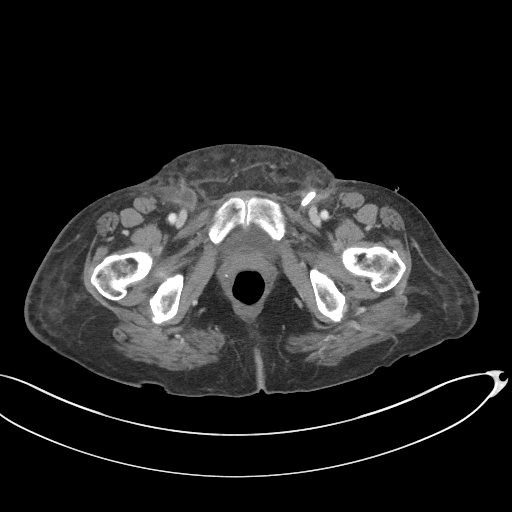
[im 18/77  soft-tissue]
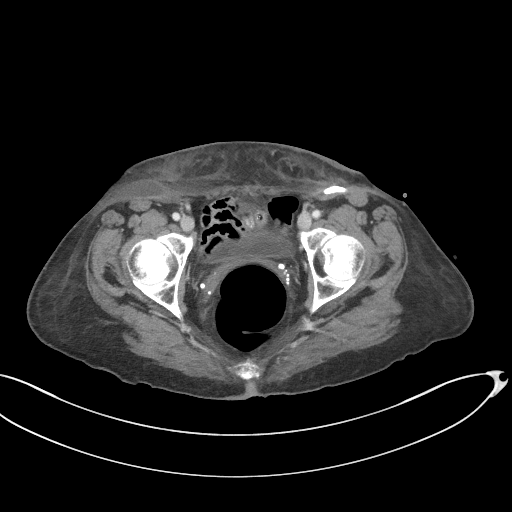
[im 24/77  soft-tissue]
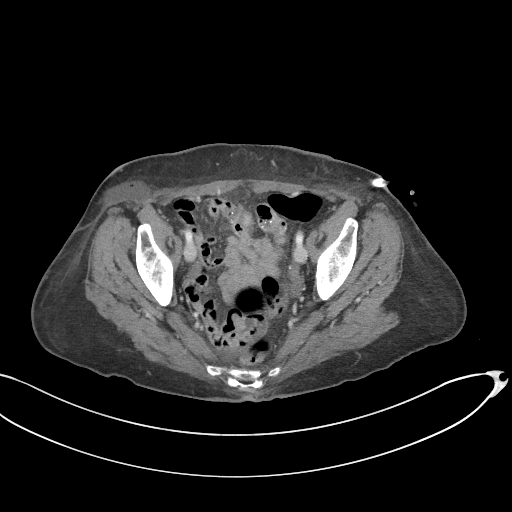
[im 30/77  soft-tissue]
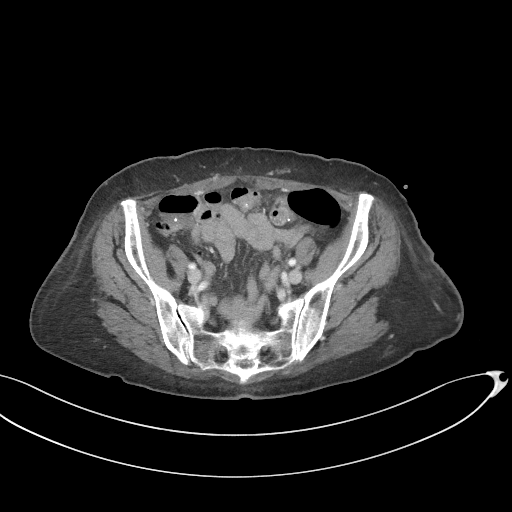
[im 41/77  soft-tissue]
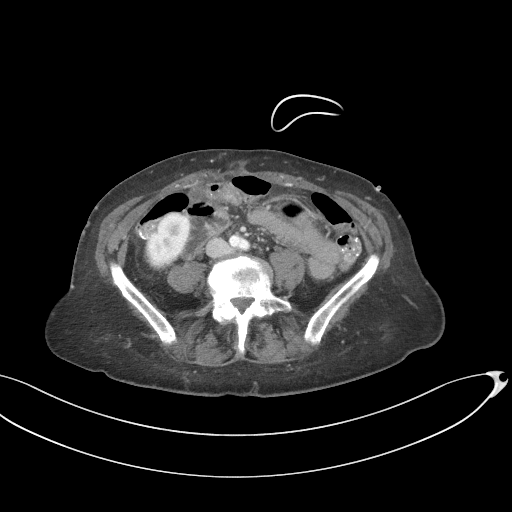
[im 47/77  soft-tissue]
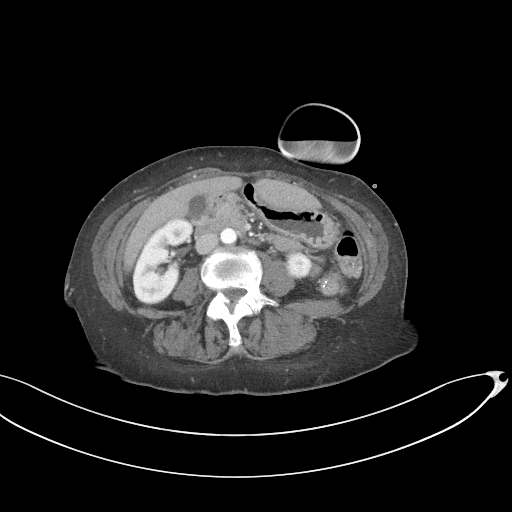
[im 53/77  soft-tissue]
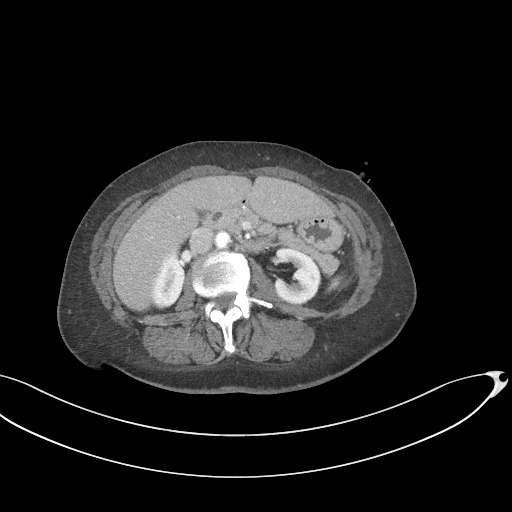
[im 59/77  soft-tissue]
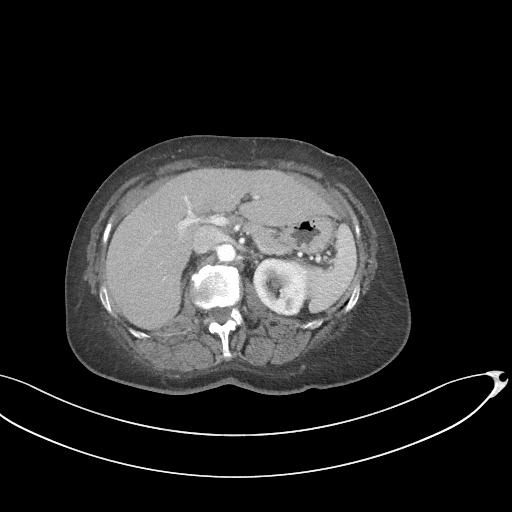
[im 59/77  bone]
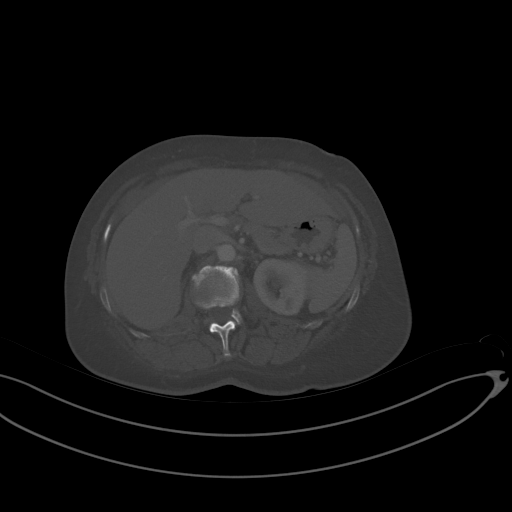
[im 65/77  soft-tissue]
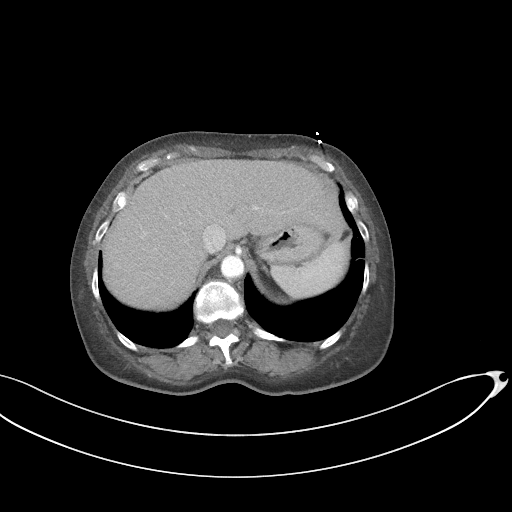
[im 71/77  soft-tissue]
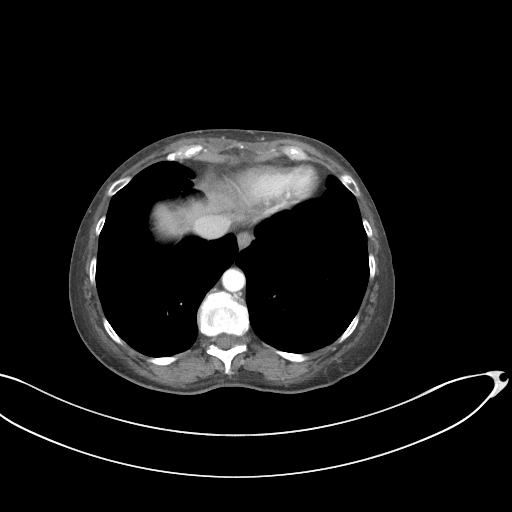

[Series 5: coronal st · coronal · 0.75mm/px · 3 of 79 slices shown]
[im 27/79  soft-tissue]
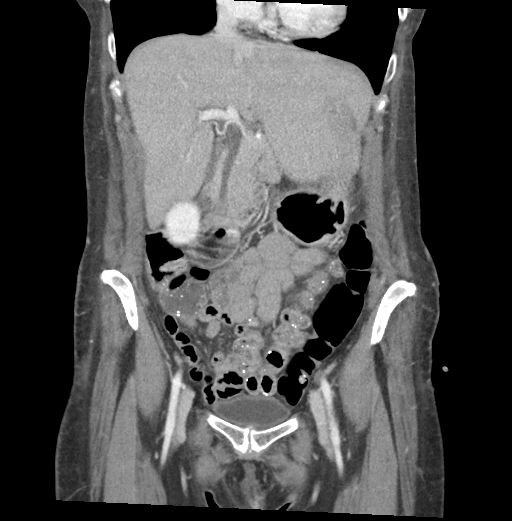
[im 35/79  soft-tissue]
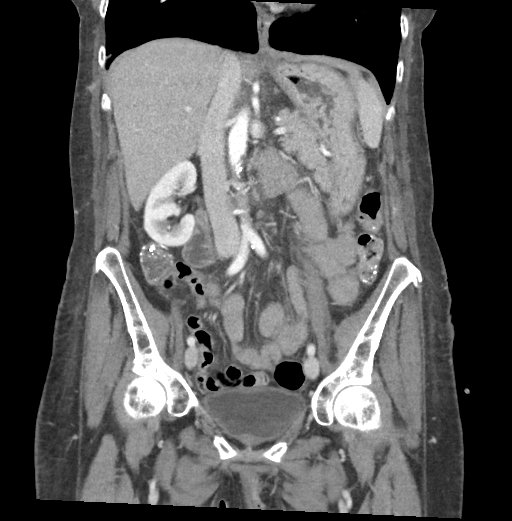
[im 44/79  soft-tissue]
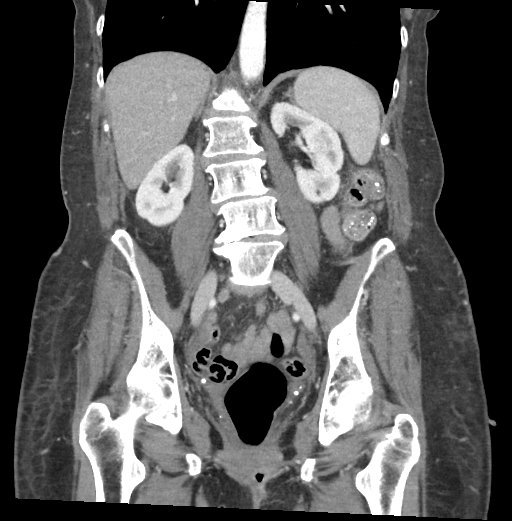

[14 of 46 positions shown; findings below may reference images not displayed]

RADIATION DOSE REDUCTION: This exam was performed according to the
departmental dose-optimization program which includes automated
exposure control, adjustment of the mA and/or kV according to
patient size and/or use of iterative reconstruction technique.

CONTRAST:  100mL OMNIPAQUE IOHEXOL 300 MG/ML  SOLN
FINDINGS: Lower chest: Circumscribed nodule in the left lower lung measuring 4
mm diameter. Lungs are otherwise clear.

Hepatobiliary: No focal liver abnormality is seen. No gallstones,
gallbladder wall thickening, or biliary dilatation.

Pancreas: Unremarkable. No pancreatic ductal dilatation or
surrounding inflammatory changes.

Spleen: Normal in size without focal abnormality.

Adrenals/Urinary Tract: Adrenal glands are unremarkable. Kidneys are
normal, without renal calculi, focal lesion, or hydronephrosis.
Bladder is unremarkable.

Stomach/Bowel: Stomach is within normal limits. Appendix is not
identified. No evidence of bowel wall thickening, distention, or
inflammatory changes.

Vascular/Lymphatic: No significant vascular findings are present. No
enlarged abdominal or pelvic lymph nodes.

Reproductive: Status post hysterectomy. No adnexal masses.

Other: No free air or free fluid in the abdomen. Postoperative
changes again demonstrated with gas and infiltration in the left
labial region. A left inguinal drain remains in place. The right
inguinal drain has come out in the interval and there is recurrence
of right inguinal collection measuring up to about 1.8 x 5.5 cm in
diameter. Soft tissue infiltration is likely edematous the could
indicate cellulitis.

Musculoskeletal: Degenerative changes in the spine. No destructive
bone lesions.
IMPRESSION: 1. Postoperative changes again demonstrated in the labia and groin
regions. Recurrence of collection in the right inguinal region after
catheter removal.
2. No free air or free fluid in the abdomen.

ADDENDUM:
4 mm nodule in the left lower lung. No follow-up needed if patient
is low-risk.This recommendation follows the consensus statement:
Guidelines for Management of Incidental Pulmonary Nodules Detected
[DATE].

*** End of Addendum ***
RADIATION DOSE REDUCTION: This exam was performed according to the
departmental dose-optimization program which includes automated
exposure control, adjustment of the mA and/or kV according to
patient size and/or use of iterative reconstruction technique.

CONTRAST:  100mL OMNIPAQUE IOHEXOL 300 MG/ML  SOLN
FINDINGS: Lower chest: Circumscribed nodule in the left lower lung measuring 4
mm diameter. Lungs are otherwise clear.

Hepatobiliary: No focal liver abnormality is seen. No gallstones,
gallbladder wall thickening, or biliary dilatation.

Pancreas: Unremarkable. No pancreatic ductal dilatation or
surrounding inflammatory changes.

Spleen: Normal in size without focal abnormality.

Adrenals/Urinary Tract: Adrenal glands are unremarkable. Kidneys are
normal, without renal calculi, focal lesion, or hydronephrosis.
Bladder is unremarkable.

Stomach/Bowel: Stomach is within normal limits. Appendix is not
identified. No evidence of bowel wall thickening, distention, or
inflammatory changes.

Vascular/Lymphatic: No significant vascular findings are present. No
enlarged abdominal or pelvic lymph nodes.

Reproductive: Status post hysterectomy. No adnexal masses.

Other: No free air or free fluid in the abdomen. Postoperative
changes again demonstrated with gas and infiltration in the left
labial region. A left inguinal drain remains in place. The right
inguinal drain has come out in the interval and there is recurrence
of right inguinal collection measuring up to about 1.8 x 5.5 cm in
diameter. Soft tissue infiltration is likely edematous the could
indicate cellulitis.

Musculoskeletal: Degenerative changes in the spine. No destructive
bone lesions.
IMPRESSION: 1. Postoperative changes again demonstrated in the labia and groin
regions. Recurrence of collection in the right inguinal region after
catheter removal.
2. No free air or free fluid in the abdomen.

## 2022-12-22 ENCOUNTER — Other Ambulatory Visit: Payer: Self-pay | Admitting: Primary Care

## 2022-12-22 DIAGNOSIS — J439 Emphysema, unspecified: Secondary | ICD-10-CM

## 2022-12-22 NOTE — Telephone Encounter (Signed)
Spoke to pt, scheduled cpe 03/30/23

## 2022-12-22 NOTE — Telephone Encounter (Signed)
Patient is due for CPE/follow up in October, this will be required prior to any further refills.  Please schedule, thank you!

## 2022-12-22 NOTE — Telephone Encounter (Signed)
LVM for patient to c/b and schedule.  

## 2023-03-14 ENCOUNTER — Other Ambulatory Visit: Payer: Self-pay | Admitting: Primary Care

## 2023-03-14 DIAGNOSIS — J439 Emphysema, unspecified: Secondary | ICD-10-CM

## 2023-03-30 ENCOUNTER — Other Ambulatory Visit: Payer: Self-pay | Admitting: Primary Care

## 2023-03-30 ENCOUNTER — Ambulatory Visit (INDEPENDENT_AMBULATORY_CARE_PROVIDER_SITE_OTHER): Payer: Medicare Other | Admitting: Primary Care

## 2023-03-30 VITALS — BP 132/68 | HR 80 | Temp 97.9°F | Ht 61.5 in | Wt 115.0 lb

## 2023-03-30 DIAGNOSIS — F419 Anxiety disorder, unspecified: Secondary | ICD-10-CM

## 2023-03-30 DIAGNOSIS — Z13220 Encounter for screening for lipoid disorders: Secondary | ICD-10-CM

## 2023-03-30 DIAGNOSIS — G43009 Migraine without aura, not intractable, without status migrainosus: Secondary | ICD-10-CM

## 2023-03-30 DIAGNOSIS — Z1211 Encounter for screening for malignant neoplasm of colon: Secondary | ICD-10-CM

## 2023-03-30 DIAGNOSIS — J3489 Other specified disorders of nose and nasal sinuses: Secondary | ICD-10-CM | POA: Insufficient documentation

## 2023-03-30 DIAGNOSIS — C519 Malignant neoplasm of vulva, unspecified: Secondary | ICD-10-CM | POA: Diagnosis not present

## 2023-03-30 DIAGNOSIS — J439 Emphysema, unspecified: Secondary | ICD-10-CM | POA: Diagnosis not present

## 2023-03-30 DIAGNOSIS — E2839 Other primary ovarian failure: Secondary | ICD-10-CM

## 2023-03-30 DIAGNOSIS — Z0001 Encounter for general adult medical examination with abnormal findings: Secondary | ICD-10-CM

## 2023-03-30 DIAGNOSIS — Z1231 Encounter for screening mammogram for malignant neoplasm of breast: Secondary | ICD-10-CM

## 2023-03-30 MED ORDER — BUSPIRONE HCL 5 MG PO TABS
ORAL_TABLET | ORAL | 0 refills | Status: DC
Start: 2023-03-30 — End: 2023-04-13

## 2023-03-30 NOTE — Assessment & Plan Note (Signed)
Following with radiology oncology, although has not been back for follow up since November 2023. Discussed that she is overdue for follow up and to schedule an appointment.   Reviewed PET scan from April 2023 and CT abdomen/pelvis from June 2023.

## 2023-03-30 NOTE — Assessment & Plan Note (Signed)
Prevnar 20 provided today. Mammogram and bone density scan due, orders placed. Colonoscopy overdue.  She declines colonoscopy but will complete Cologuard.  Orders placed.  Discussed the importance of a healthy diet and regular exercise in order for weight loss, and to reduce the risk of further co-morbidity.  Exam stable. Labs pending.  Follow up in 1 year for repeat physical.

## 2023-03-30 NOTE — Assessment & Plan Note (Signed)
Uncontrolled.   She has failed Prozac, Zoloft, Lexapro. We will switch medication classes.  Start buspirone 5 mg once to twice daily.  She will update.

## 2023-03-30 NOTE — Assessment & Plan Note (Signed)
Likely viral at this point.  Fortunately, she is feeling better.  Continue conservative treatment. She was instructed to contact her office later this week if symptoms do not continue to improve.

## 2023-03-30 NOTE — Patient Instructions (Addendum)
Stop by the lab prior to leaving today. I will notify you of your results once received.   [facility] to schedule your mammogram and bone density scan.   Complete the Cologuard kit once received.  Start buspirone 5 mg once or twice daily for anxiety.  You will either be contacted via phone regarding your referral to neurology, or you may receive a letter on your MyChart portal from our referral team with instructions for scheduling an appointment. Please let us know if you have not been contacted by anyone within two weeks.   It was a pleasure to see you today!

## 2023-03-30 NOTE — Assessment & Plan Note (Signed)
Uncontrolled.  Has not seen neurology in over 2 years. Referral placed to reestablish with her neurologist for further evaluation and treatment.

## 2023-03-30 NOTE — Assessment & Plan Note (Signed)
Controlled.  Continue Symbicort 160-4.5 mcg, 2 puffs BID. Continue albuterol inhaler PRN.

## 2023-03-30 NOTE — Progress Notes (Signed)
Subjective:    Patient ID: Betty Hurst, female    DOB: [birth year], 67 y.o.   MRN: [id number]  HPI  Betty Hurst is a very pleasant 67 y.o. female who presents today for complete physical and follow up of chronic conditions.  She would also like to discuss anxiety. Chronic for years. More recently managed on Lexapro 10 mg which was initiated in October 2023. She took for just a few months but stopped it as it was ineffective. Symptoms include feeling anxious, worrying.   She would also like to discuss sinus pressure and nasal congestion. Symptom onset 6 days ago. Currently she has pressure to the bilateral maxillary sinuses.  She has experienced green nasal discharge.  Today she has started to feel better.  Immunizations: -Tetanus: Completed in 2010 -Influenza: Declines  -Shingles: Never completed, never had chicken pox. -Pneumonia: Completed last in 2016  Diet: Fair diet.  Exercise: No regular exercise.  Eye exam: Completes annually  Dental exam: Completes semi-annually    Mammogram: Completed in November 2023 Bone Density Scan: Completed years ago  Colonoscopy: Completed in 2013, due 2023 and declined.  BP Readings from Last 3 Encounters:  03/30/23 132/68  04/23/22 (!) 142/60  03/24/22 (!) 124/58      Review of Systems  Constitutional:  Negative for unexpected weight change.  HENT:  Positive for postnasal drip and sinus pressure. Negative for rhinorrhea.   Respiratory:  Negative for cough and shortness of breath.   Cardiovascular:  Negative for chest pain.  Gastrointestinal:  Negative for constipation and diarrhea.  Genitourinary:  Negative for difficulty urinating.  Musculoskeletal:  Negative for arthralgias and myalgias.  Skin:  Negative for rash.  Allergic/Immunologic: Positive for environmental allergies.  Neurological:  Positive for headaches. Negative for dizziness.  Psychiatric/Behavioral:  The patient is nervous/anxious.          Past Medical  History:  Diagnosis Date   Acute non-recurrent sinusitis 10/13/2018   Anemia    Asthma    simbicort, no rescue inhaler use   Basal cell carcinoma of skin    on forehead and temple, and left side of nose   Complication of anesthesia    Frequent sinus infections    Headache    History of radiation therapy    Vulva- 01/29/22-03/17/22- Dr. Antony Blackbird   Migraine    PONV (postoperative nausea and vomiting)    Small bowel obstruction [facility])    Surgery in 2006   Vulvar cancer [facility])     Social History   Socioeconomic History   Marital status: Widowed    Spouse name: Not on file   Number of children: 2   Years of education: 2 years college   Highest education level: Not on file  Occupational History   Not on file  Tobacco Use   Smoking status: Every Day    Current packs/day: 0.25    Types: Cigarettes   Smokeless tobacco: Never   Tobacco comments:    Patient has cut back "a lot" since her husband has been sick.  Vaping Use   Vaping status: Never Used  Substance and Sexual Activity   Alcohol use: Not Currently    Comment: daily   Drug use: No   Sexual activity: Yes    Partners: Male  Other Topics Concern   Not on file  Social History Narrative   Married.   Lives in [place] with her husband.   2 daughters, 4 grandchildren.   On disability from work.  Enjoys being outside, gardening, volunteers at [employer].   Caffeine: diet coke 16-24 oz per day   Social Determinants of Health   Financial Resource Strain: Low Risk  (01/01/2022)   Overall Financial Resource Strain (CARDIA)    Difficulty of Paying Living Expenses: Not hard at all  Food Insecurity: No Food Insecurity (01/01/2022)   Hunger Vital Sign    Worried About Running Out of Food in the Last Year: Never true    Ran Out of Food in the Last Year: Never true  Transportation Needs: No Transportation Needs (01/01/2022)   PRAPARE - Administrator, Civil Service (Medical): No    Lack of  Transportation (Non-Medical): No  Physical Activity: Inactive (01/01/2022)   Exercise Vital Sign    Days of Exercise per Week: 0 days    Minutes of Exercise per Session: 0 min  Stress: Stress Concern Present (01/01/2022)   [employer] of Occupational Health - Occupational Stress Questionnaire    Feeling of Stress : To some extent  Social Connections: Not on file  Intimate Partner Violence: Not on file    Past Surgical History:  Procedure Laterality Date   ABDOMINAL HYSTERECTOMY  1989   thinks had BSO   COLPOSCOPY N/A 11/11/2021   Procedure: COLPOSCOPY OF CERVIX WITH POSSIBLE BIOPSIES;  Surgeon: Carver Fila, MD;  Location: [facility];  Service: Gynecology;  Laterality: N/A;   HEMORROIDECTOMY     INGUINAL LYMPHADENECTOMY Bilateral 11/11/2021   Procedure: INGUINAL LYMPHADENECTOMY;  Surgeon: Carver Fila, MD;  Location: [facility];  Service: Gynecology;  Laterality: Bilateral;   NASAL SINUS SURGERY     14 surgeries   OTHER SURGICAL HISTORY     Bladder sling   RADICAL VULVECTOMY N/A 11/11/2021   Procedure: PARTIAL RADICAL VULVECTOMY;  Surgeon: Carver Fila, MD;  Location: [facility];  Service: Gynecology;  Laterality: N/A;   SKIN BIOPSY     basal cell on face   SMALL INTESTINE SURGERY     Small bowel obstruction in 2006   TONSILLECTOMY     vulvectomy      Family History  Problem Relation Age of Onset   Cancer Father    Stroke Father        Deceased   Colon cancer Father    Pancreatic cancer Maternal Uncle    Kidney disease Maternal Grandfather    Stroke Paternal Grandmother    Renal Disease Paternal Grandfather    Heart failure Paternal Grandfather    Heart attack Paternal Grandfather    Breast cancer Niece    Migraines Neg Hx     Allergies  Allergen Reactions   Influenza Vaccines Hives   Morphine Hives and Swelling   Amitriptyline Rash   Cefuroxime Axetil Rash   Lorazepam Palpitations   Pyridium [Phenazopyridine Hcl] Palpitations    Felt jittery     Current Outpatient Medications on File Prior to Visit  Medication Sig Dispense Refill   acetaminophen (TYLENOL) 500 MG tablet Take 1,000 mg by mouth every 6 (six) hours as needed for headache.     albuterol (VENTOLIN HFA) 108 (90 Base) MCG/ACT inhaler Inhale 2 puffs into the lungs every 4 (four) hours as needed for shortness of breath. 1 each 0   aspirin 81 MG chewable tablet Chew by mouth daily.     budesonide-formoterol (SYMBICORT) 160-4.5 MCG/ACT inhaler USE 2 INHALATIONS BY MOUTH TWICE DAILY 30.6 g 0   diphenhydrAMINE HCl (BENADRYL ALLERGY PO) Take 1 tablet by mouth as needed (  allergies).     diphenhydramine-acetaminophen (TYLENOL PM) 25-500 MG TABS tablet Take 1 tablet by mouth at bedtime as needed (pain).     omeprazole (PRILOSEC OTC) 20 MG tablet Take 20 mg by mouth daily.     Phenyleph-CPM-DM-APAP (ALKA-SELTZER PLUS COLD & COUGH) 10-21-08-325 MG CAPS Take 2 tablets by mouth every 8 (eight) hours as needed (cold/ allergies).     No current facility-administered medications on file prior to visit.    BP 132/68   Pulse 80   Temp 97.9 F (36.6 C) (Oral)   Ht 5' 1.5" (1.562 m)   Wt 115 lb (52.2 kg)   SpO2 98%   BMI 21.38 kg/m  Objective:   Physical Exam HENT:     Right Ear: Tympanic membrane and ear canal normal.     Left Ear: Tympanic membrane and ear canal normal.  Eyes:     Pupils: Pupils are equal, round, and reactive to light.  Cardiovascular:     Rate and Rhythm: Normal rate and regular rhythm.  Pulmonary:     Effort: Pulmonary effort is normal.     Breath sounds: Normal breath sounds.  Abdominal:     General: Bowel sounds are normal.     Palpations: Abdomen is soft.     Tenderness: There is no abdominal tenderness.  Musculoskeletal:        General: Normal range of motion.     Cervical back: Neck supple.  Skin:    General: Skin is warm and dry.  Neurological:     Mental Status: She is alert and oriented to person, place, and time.     Cranial Nerves: No  cranial nerve deficit.     Deep Tendon Reflexes:     Reflex Scores:      Patellar reflexes are 2+ on the right side and 2+ on the left side. Psychiatric:        Mood and Affect: Mood normal.           Assessment & Plan:  Encounter for annual general medical examination with abnormal findings in adult Assessment & Plan: Prevnar 20 provided today. Mammogram and bone density scan due, orders placed. Colonoscopy overdue.  She declines colonoscopy but will complete Cologuard.  Orders placed.  Discussed the importance of a healthy diet and regular exercise in order for weight loss, and to reduce the risk of further co-morbidity.  Exam stable. Labs pending.  Follow up in 1 year for repeat physical.   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 Discussed that she is overdue for follow up and to schedule an appointment.   Reviewed PET scan from April 2023 and CT abdomen/pelvis from June 2023.  Orders: -     Lipid panel -     Comprehensive metabolic panel -     CBC  Pulmonary emphysema, unspecified emphysema type (HCC) Assessment & Plan: Controlled.  Continue Symbicort 160-4.5 mcg, 2 puffs BID. Continue albuterol inhaler PRN.   Screening mammogram for breast cancer -     3D Screening Mammogram, Left and Right; Future  Estrogen deficiency -     DG Bone Density; Future  Screening for colon cancer -     Cologuard  Migraine without aura and without status migrainosus, not intractable Assessment & Plan: Uncontrolled.  Has not seen neurology in over 2 years. Referral placed to reestablish  with her neurologist for further evaluation and treatment.  Orders: -     Ambulatory referral to Neurology  Anxiety Assessment & Plan: Uncontrolled.   She has failed Prozac, Zoloft, Lexapro. We will switch medication classes.   Start buspirone 5 mg once to twice daily.  She will update.   Sinus pressure Assessment & Plan: Likely viral at this point.  Fortunately, she is feeling better.  Continue conservative treatment. She was instructed to contact her office later this week if symptoms do not continue to improve.   Screening for hyperlipidemia -     Lipid panel        Doreene Nest, NP

## 2023-03-31 ENCOUNTER — Other Ambulatory Visit: Payer: Self-pay | Admitting: Primary Care

## 2023-03-31 ENCOUNTER — Other Ambulatory Visit: Payer: Medicare Other

## 2023-03-31 DIAGNOSIS — R718 Other abnormality of red blood cells: Secondary | ICD-10-CM | POA: Diagnosis not present

## 2023-03-31 DIAGNOSIS — E538 Deficiency of other specified B group vitamins: Secondary | ICD-10-CM

## 2023-03-31 LAB — LIPID PANEL
Cholesterol: 210 mg/dL — ABNORMAL HIGH (ref 0–200)
HDL: 126.7 mg/dL (ref >39.00)
LDL Cholesterol: 68 mg/dL (ref 0–99)
NonHDL: 83.58
Total CHOL/HDL Ratio: 2
Triglycerides: 78 mg/dL (ref 0.0–149.0)
VLDL: 15.6 mg/dL (ref 0.0–40.0)

## 2023-03-31 LAB — COMPREHENSIVE METABOLIC PANEL
ALT: 15 U/L (ref 0–35)
AST: 20 U/L (ref 0–37)
Albumin: 4.4 g/dL (ref 3.5–5.2)
Alkaline Phosphatase: 61 U/L (ref 39–117)
BUN: 10 mg/dL (ref 6–23)
CO2: 28 meq/L (ref 19–32)
Calcium: 9.7 mg/dL (ref 8.4–10.5)
Chloride: 93 meq/L — ABNORMAL LOW (ref 96–112)
Creatinine, Ser: 0.69 mg/dL (ref 0.40–1.20)
GFR: 91.33 mL/min (ref >60.00)
Glucose, Bld: 92 mg/dL (ref 70–99)
Potassium: 4 meq/L (ref 3.5–5.1)
Sodium: 131 meq/L — ABNORMAL LOW (ref 135–145)
Total Bilirubin: 0.5 mg/dL (ref 0.2–1.2)
Total Protein: 6.7 g/dL (ref 6.0–8.3)

## 2023-03-31 LAB — CBC
HCT: 38.3 % (ref 36.0–46.0)
Hemoglobin: 13 g/dL (ref 12.0–15.0)
MCHC: 33.9 g/dL (ref 30.0–36.0)
MCV: 118.5 fL — ABNORMAL HIGH (ref 78.0–100.0)
Platelets: 140 10*3/uL — ABNORMAL LOW (ref 150.0–400.0)
RBC: 3.23 Mil/uL — ABNORMAL LOW (ref 3.87–5.11)
RDW: 11.7 % (ref 11.5–15.5)
WBC: 5.9 10*3/uL (ref 4.0–10.5)

## 2023-03-31 LAB — VITAMIN B12: Vitamin B-12: 169 pg/mL — ABNORMAL LOW (ref 211–911)

## 2023-03-31 LAB — FOLATE: Folate: 8.2 ng/mL (ref >5.9)

## 2023-04-02 DIAGNOSIS — J3489 Other specified disorders of nose and nasal sinuses: Secondary | ICD-10-CM

## 2023-04-02 MED ORDER — AZITHROMYCIN 250 MG PO TABS
ORAL_TABLET | ORAL | 0 refills | Status: DC
Start: 2023-04-02 — End: 2023-06-21

## 2023-04-12 ENCOUNTER — Encounter (INDEPENDENT_AMBULATORY_CARE_PROVIDER_SITE_OTHER): Payer: Medicare Other

## 2023-04-12 DIAGNOSIS — G43009 Migraine without aura, not intractable, without status migrainosus: Secondary | ICD-10-CM

## 2023-04-12 DIAGNOSIS — F419 Anxiety disorder, unspecified: Secondary | ICD-10-CM

## 2023-04-13 DIAGNOSIS — G43009 Migraine without aura, not intractable, without status migrainosus: Secondary | ICD-10-CM

## 2023-04-13 DIAGNOSIS — F419 Anxiety disorder, unspecified: Secondary | ICD-10-CM

## 2023-04-14 ENCOUNTER — Telehealth: Payer: Self-pay | Admitting: Neurology

## 2023-04-14 MED ORDER — DULOXETINE HCL 20 MG PO CPEP
20.0000 mg | ORAL_CAPSULE | Freq: Every day | ORAL | 0 refills | Status: DC
Start: 2023-04-14 — End: 2023-07-10

## 2023-04-14 NOTE — Telephone Encounter (Signed)
Please see the MyChart message reply(ies) for my assessment and plan.  The patient gave consent for this Medical Advice Message and is aware that it may result in a bill to their insurance company as well as the possibility that this may result in a co-payment or deductible. They are an established patient, but are not seeking medical advice exclusively about a problem treated during an in person or video visit in the last 7 days. I did not recommend an in person or video visit within 7 days of my reply.  I spent a total of 10 minutes cumulative time within 7 days through MyChart messaging Delaney Perona K Shivank Pinedo, NP  

## 2023-04-14 NOTE — Telephone Encounter (Signed)
Pt LVM to schedule an appointment from referral sent for migraines by PCP.  Called Pt no answer left voicemail to call back and will transfer to New Patient Referral to schedule appointment

## 2023-04-20 DIAGNOSIS — Z1211 Encounter for screening for malignant neoplasm of colon: Secondary | ICD-10-CM | POA: Diagnosis not present

## 2023-04-27 LAB — COLOGUARD: COLOGUARD: NEGATIVE

## 2023-05-25 ENCOUNTER — Other Ambulatory Visit: Payer: Self-pay | Admitting: Primary Care

## 2023-05-25 DIAGNOSIS — J439 Emphysema, unspecified: Secondary | ICD-10-CM

## 2023-06-01 ENCOUNTER — Other Ambulatory Visit: Payer: Medicare Other

## 2023-06-05 ENCOUNTER — Ambulatory Visit
Admission: EM | Admit: 2023-06-05 | Discharge: 2023-06-05 | Disposition: A | Discharge status: Home / Self Care | Payer: Medicare Other | Attending: Emergency Medicine | Admitting: Emergency Medicine

## 2023-06-05 ENCOUNTER — Ambulatory Visit: Payer: Medicare Other

## 2023-06-05 DIAGNOSIS — M542 Cervicalgia: Secondary | ICD-10-CM

## 2023-06-05 DIAGNOSIS — M50323 Other cervical disc degeneration at C6-C7 level: Secondary | ICD-10-CM | POA: Diagnosis not present

## 2023-06-05 DIAGNOSIS — M4807 Spinal stenosis, lumbosacral region: Secondary | ICD-10-CM | POA: Diagnosis not present

## 2023-06-05 DIAGNOSIS — M4802 Spinal stenosis, cervical region: Secondary | ICD-10-CM | POA: Diagnosis not present

## 2023-06-05 DIAGNOSIS — Z041 Encounter for examination and observation following transport accident: Secondary | ICD-10-CM | POA: Diagnosis not present

## 2023-06-05 DIAGNOSIS — M5136 Other intervertebral disc degeneration, lumbar region with discogenic back pain only: Secondary | ICD-10-CM | POA: Diagnosis not present

## 2023-06-05 DIAGNOSIS — M545 Low back pain, unspecified: Secondary | ICD-10-CM | POA: Diagnosis not present

## 2023-06-05 DIAGNOSIS — S199XXA Unspecified injury of neck, initial encounter: Secondary | ICD-10-CM | POA: Diagnosis not present

## 2023-06-05 MED ORDER — METHOCARBAMOL 500 MG PO TABS: 500.0000 mg | ORAL_TABLET | Freq: Two times a day (BID) | ORAL | 0 refills | Status: DC | PRN

## 2023-06-05 NOTE — Discharge Instructions (Addendum)
Follow up with your primary care provider on Monday.  Go to the emergency department if you have worsening symptoms.    Your x-rays are pending.    Take ibuprofen as needed for discomfort.    Take the muscle relaxer as needed for muscle spasm; Do not drive, operate machinery, or drink alcohol with this medication as it can cause drowsiness.
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
## 2023-06-10 ENCOUNTER — Other Ambulatory Visit (INDEPENDENT_AMBULATORY_CARE_PROVIDER_SITE_OTHER): Payer: Medicare Other

## 2023-06-10 DIAGNOSIS — E538 Deficiency of other specified B group vitamins: Secondary | ICD-10-CM | POA: Diagnosis not present

## 2023-06-11 LAB — VITAMIN B12: Vitamin B-12: 1436 pg/mL — ABNORMAL HIGH (ref 211–911)

## 2023-06-21 ENCOUNTER — Ambulatory Visit (INDEPENDENT_AMBULATORY_CARE_PROVIDER_SITE_OTHER): Payer: Medicare Other | Admitting: Primary Care

## 2023-06-21 ENCOUNTER — Encounter: Payer: Self-pay | Admitting: Primary Care

## 2023-06-21 VITALS — BP 128/86 | HR 88 | Temp 97.8°F | Ht 61.5 in | Wt 118.0 lb

## 2023-06-21 DIAGNOSIS — M542 Cervicalgia: Secondary | ICD-10-CM | POA: Diagnosis not present

## 2023-06-21 DIAGNOSIS — M546 Pain in thoracic spine: Secondary | ICD-10-CM | POA: Diagnosis not present

## 2023-06-21 HISTORY — DX: Pain in thoracic spine: M54.6

## 2023-06-21 MED ORDER — PREDNISONE 20 MG PO TABS: ORAL_TABLET | ORAL | 0 refills | Status: DC

## 2023-06-21 MED ORDER — METHYLPREDNISOLONE ACETATE 80 MG/ML IJ SUSP
80.0000 mg | Freq: Once | INTRAMUSCULAR | Status: AC
  Administered 2023-06-21: 80 mg via INTRAMUSCULAR

## 2023-06-21 NOTE — Patient Instructions (Signed)
You will either be contacted via phone regarding your referral to physical therapy, or you may receive a letter on your MyChart portal from our referral team with instructions for scheduling an appointment. Please let us know if you have not been contacted by anyone within two weeks.  Start prednisone tablets. Take two tablets my mouth once daily in the morning for four days, then one tablet once daily in the morning for four days.   Take the full tablet of the methocarbamol muscle relaxer every 8 hours for muscle spasm/tightness.  Please update me if you do not experience improvement in your symptoms.  It was a pleasure to see you today!

## 2023-06-21 NOTE — Addendum Note (Signed)
Addended by: Lonia Blood on: 06/21/2023 02:27 PM   Modules accepted: Orders
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
## 2023-06-21 NOTE — Assessment & Plan Note (Signed)
Acute on chronic.  Reviewed x-rays from urgent care.  Discussed to start full tablet methocarbamol 500 mg 3 times daily as needed. Depo-Medrol 80 mg provided intramuscularly in the office today.  Start prednisone tablets tomorrow. Take two tablets my mouth once daily in the morning for four days, then one tablet once daily in the morning for four days. Referral placed for physical therapy.

## 2023-07-10 ENCOUNTER — Other Ambulatory Visit: Payer: Self-pay | Admitting: Primary Care

## 2023-07-10 DIAGNOSIS — G43009 Migraine without aura, not intractable, without status migrainosus: Secondary | ICD-10-CM

## 2023-07-10 DIAGNOSIS — F419 Anxiety disorder, unspecified: Secondary | ICD-10-CM

## 2023-07-16 ENCOUNTER — Ambulatory Visit: Payer: Medicare Other

## 2023-08-05 ENCOUNTER — Ambulatory Visit: Payer: Medicare Other

## 2023-08-10 ENCOUNTER — Encounter: Payer: Self-pay | Admitting: Neurology

## 2023-08-10 ENCOUNTER — Ambulatory Visit: Payer: Medicare Other | Admitting: Neurology

## 2023-08-10 VITALS — BP 131/75 | HR 92 | Ht 61.0 in | Wt 119.2 lb

## 2023-08-10 DIAGNOSIS — G43711 Chronic migraine without aura, intractable, with status migrainosus: Secondary | ICD-10-CM | POA: Diagnosis not present
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
## 2023-08-11 ENCOUNTER — Encounter: Payer: Self-pay | Admitting: Neurology

## 2023-08-12 ENCOUNTER — Ambulatory Visit: Payer: Medicare Other

## 2023-08-12 ENCOUNTER — Telehealth: Payer: Self-pay

## 2023-08-12 NOTE — Telephone Encounter (Signed)
Needs Botox auth. New start.   Chronic Migraine CPT 64615    Botox [id number] Units:200   G43.711 Chronic Migraine without aura, intractable, with status migrainous

## 2023-08-12 NOTE — Telephone Encounter (Signed)
-----   Message from Anson Fret sent at 08/11/2023  8:23 PM EST ----- Regarding: start botox protocol Start botox protocol can be placed with an NP thanks [phone]

## 2023-08-12 NOTE — Telephone Encounter (Signed)
Submitted auth via [employer] portal, Berkley Harvey was approved for B/B.  Auth#: [id number] (08/12/23-08/11/24)

## 2023-08-17 ENCOUNTER — Ambulatory Visit: Payer: Medicare Other

## 2023-08-17 ENCOUNTER — Ambulatory Visit: Payer: Medicare Other | Attending: Primary Care

## 2023-08-19 ENCOUNTER — Ambulatory Visit: Payer: Medicare Other

## 2023-08-19 ENCOUNTER — Ambulatory Visit: Payer: Self-pay | Admitting: Primary Care

## 2023-08-19 ENCOUNTER — Telehealth (INDEPENDENT_AMBULATORY_CARE_PROVIDER_SITE_OTHER): Payer: Medicare Other | Admitting: Internal Medicine

## 2023-08-19 ENCOUNTER — Encounter: Payer: Self-pay | Admitting: Internal Medicine

## 2023-08-19 VITALS — Temp 100.9°F

## 2023-08-19 DIAGNOSIS — B9789 Other viral agents as the cause of diseases classified elsewhere: Secondary | ICD-10-CM | POA: Insufficient documentation

## 2023-08-19 DIAGNOSIS — J988 Other specified respiratory disorders: Secondary | ICD-10-CM | POA: Diagnosis not present

## 2023-08-19 MED ORDER — ONDANSETRON HCL 4 MG PO TABS: 4.0000 mg | ORAL_TABLET | Freq: Three times a day (TID) | ORAL | 0 refills | Status: DC | PRN

## 2023-08-19 NOTE — Telephone Encounter (Signed)
 Copied From CRM [id number]. Reason for Triage: Patient experiencing nausea, diarrhea, feels like a ear infection. Also has a persistent cough.

## 2023-08-19 NOTE — Telephone Encounter (Signed)
 Called and scheduled patient virtual appt today 3pm with Dr. Alphonsus Sias.

## 2023-08-19 NOTE — Assessment & Plan Note (Signed)
 May have been flu COVID negative Prominent diarrhea---some better Will give ondansetron for nausea Limit imodium--but okay x 1 if a lot of diarrhea Concentrate on oral rehydration Call 911 again for ER evaluation if not improving

## 2023-08-19 NOTE — Progress Notes (Signed)
 Subjective:    Patient ID: Betty Hurst, female    DOB: Oct 06, 1955, 68 y.o.   MRN: [id number]  HPI Video virtual visit due to respiratory illness and diarrhea Identification done Reviewed limitations and billing and she gave consent Participants--patient in her home and I am in my office  Started 4 days ago---with earache and fever Achy feeling also Diarrhea started as well as nausea soon after Chronic stomach trouble but now with watery stools--slightly better with imodium (once today). No blood in stool Constant cough--but non productive  Does have nasal discharge Low grade fever--100.1 No recent chills or sweats--had at first No SOB No abdominal pain  Drinking some water and zero sugar powerade Tried oatmeal this morning--ate a little Freezer pops No dizziness but feels somewhat light headed  Has been taking tylenol   Did call 911 last night EMTs checked her but declined ER evaluation at that point  Current Outpatient Medications on File Prior to Visit  Medication Sig Dispense Refill   acetaminophen (TYLENOL) 500 MG tablet Take 1,000 mg by mouth every 6 (six) hours as needed for headache.     budesonide-formoterol (SYMBICORT) 160-4.5 MCG/ACT inhaler USE 2 INHALATIONS BY MOUTH TWICE DAILY 30.6 g 2   diphenhydrAMINE HCl (BENADRYL ALLERGY PO) Take 1 tablet by mouth as needed (allergies).     diphenhydramine-acetaminophen (TYLENOL PM) 25-500 MG TABS tablet Take 1 tablet by mouth at bedtime as needed (pain).     omeprazole (PRILOSEC OTC) 20 MG tablet Take 20 mg by mouth daily.     Phenyleph-CPM-DM-APAP (ALKA-SELTZER PLUS COLD & COUGH) 10-21-08-325 MG CAPS Take 2 tablets by mouth every 8 (eight) hours as needed (cold/ allergies).     No current facility-administered medications on file prior to visit.    Allergies  Allergen Reactions   Influenza Vaccines Hives   Morphine Hives and Swelling   Amitriptyline Rash   Cefuroxime Axetil Rash   Lorazepam Palpitations    Pyridium [Phenazopyridine Hcl] Palpitations    Felt jittery    Past Medical History:  Diagnosis Date   Acute non-recurrent sinusitis 10/13/2018   Anemia    Asthma    simbicort, no rescue inhaler use   Basal cell carcinoma of skin    on forehead and temple, and left side of nose   Complication of anesthesia    Frequent sinus infections    Headache    History of radiation therapy    Vulva- 01/29/22-03/17/22- Dr. 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[facility])     Past Surgical History:  Procedure Laterality Date   ABDOMINAL HYSTERECTOMY  1989   thinks had BSO   COLPOSCOPY N/A 11/11/2021   Procedure: COLPOSCOPY OF CERVIX WITH POSSIBLE BIOPSIES;  Surgeon: Carver Fila, MD;  Location: [facility];  Service: Gynecology;  Laterality: N/A;   HEMORROIDECTOMY     INGUINAL LYMPHADENECTOMY Bilateral 11/11/2021   Procedure: INGUINAL LYMPHADENECTOMY;  Surgeon: Carver Fila, MD;  Location: [facility];  Service: Gynecology;  Laterality: Bilateral;   NASAL SINUS SURGERY     14 surgeries   OTHER SURGICAL HISTORY     Bladder sling   RADICAL VULVECTOMY N/A 11/11/2021   Procedure: PARTIAL RADICAL VULVECTOMY;  Surgeon: Carver Fila, MD;  Location: [facility];  Service: Gynecology;  Laterality: N/A;   SKIN BIOPSY     basal cell on face  SMALL INTESTINE SURGERY     Small bowel obstruction in 2006   TONSILLECTOMY     vulvectomy      Family History  Problem Relation Age of Onset   Cancer Father    Stroke Father        Deceased   Colon cancer Father    Pancreatic cancer Maternal Uncle    Kidney disease Maternal Grandfather    Stroke Paternal Grandmother    Renal Disease Paternal Grandfather    Heart failure Paternal Grandfather    Heart attack Paternal Grandfather    Breast cancer Niece    Migraines Neg Hx     Social History   Socioeconomic History   Marital status: Widowed     Spouse name: Not on file   Number of children: 2   Years of education: 2 years college   Highest education level: Not on file  Occupational History   Not on file  Tobacco Use   Smoking status: Some Days    Current packs/day: 0.25    Types: Cigarettes   Smokeless tobacco: Never   Tobacco comments:    Patient has cut back "a lot" since her husband has been sick.  Vaping Use   Vaping status: Never Used  Substance and Sexual Activity   Alcohol use: Not Currently    Comment: daily occas   Drug use: No   Sexual activity: Yes    Partners: Male  Other Topics Concern   Not on file  Social History Narrative   Widowed   Lives in [place].   2 daughters, 4 grandchildren.   On disability from work.   Enjoys being outside, gardening, volunteers at [employer].   Caffeine: diet coke 16-24 oz per day   Social Drivers of Health   Financial Resource Strain: Low Risk  (01/01/2022)   Overall Financial Resource Strain (CARDIA)    Difficulty of Paying Living Expenses: Not hard at all  Food Insecurity: No Food Insecurity (01/01/2022)   Hunger Vital Sign    Worried About Running Out of Food in the Last Year: Never true    Ran Out of Food in the Last Year: Never true  Transportation Needs: No Transportation Needs (01/01/2022)   PRAPARE - Administrator, Civil Service (Medical): No    Lack of Transportation (Non-Medical): No  Physical Activity: Inactive (01/01/2022)   Exercise Vital Sign    Days of Exercise per Week: 0 days    Minutes of Exercise per Session: 0 min  Stress: Stress Concern Present (01/01/2022)   [employer] of Occupational Health - Occupational Stress Questionnaire    Feeling of Stress : To some extent  Social Connections: Not on file  Intimate Partner Violence: Not on file   Review of Systems Lives alone No dysuria or hematuria Still very nauseated COVID test was negative    Objective:   Physical Exam Constitutional:      Comments: Looks  washed out but no distress  Pulmonary:     Effort: Pulmonary effort is normal. No respiratory distress.  Neurological:     Mental Status: She is alert.            Assessment & Plan:

## 2023-08-23 ENCOUNTER — Ambulatory Visit: Payer: Medicare Other

## 2023-08-25 MED ORDER — AZITHROMYCIN 250 MG PO TABS: ORAL_TABLET | ORAL | 0 refills | Status: DC

## 2023-09-13 ENCOUNTER — Encounter: Payer: Self-pay | Admitting: Adult Health

## 2023-09-20 ENCOUNTER — Ambulatory Visit: Payer: Medicare Other | Admitting: Adult Health

## 2023-10-25 ENCOUNTER — Ambulatory Visit: Admitting: Adult Health

## 2023-10-25 VITALS — BP 135/73 | HR 73

## 2023-10-25 DIAGNOSIS — G43711 Chronic migraine without aura, intractable, with status migrainosus: Secondary | ICD-10-CM

## 2023-10-25 MED ORDER — ONABOTULINUMTOXINA 200 UNITS IJ SOLR
155.0000 [IU] | Freq: Once | INTRAMUSCULAR | Status: AC
  Administered 2023-10-25: 155 [IU] via INTRAMUSCULAR

## 2023-10-25 NOTE — Progress Notes (Signed)
 Botox - 200 units x 1 vial Lot: [id number] Expiration: 03/2026 NDC: 8469-6295-28   Bacteriostatic 0.9% Sodium Chloride - 4 mL  Lot: [id number] Expiration: 04/22/2024 NDC: 0102-7253-66   Dx: G43.71 BB Witnessed by Andres Bangs

## 2023-10-25 NOTE — Progress Notes (Signed)
 Update 10/25/2023 JM: Patient is being seen for initial Botox  injection.  She continues to experience frequent migraines, about 12/month which are moderate to severe and can last up to 24 hours.  Discussed potential side effects with Botox  and agreeable with proceeding.  She tolerated procedure well today.  Return in 3 months for repeat injection.      Consent Form Botulism Toxin Injection For Chronic Migraine    Reviewed orally with patient, additionally signature is on file:  Botulism toxin has been approved by the Federal drug administration for treatment of chronic migraine. Botulism toxin does not cure chronic migraine and it may not be effective in some patients.  The administration of botulism toxin is accomplished by injecting a small amount of toxin into the muscles of the neck and head. Dosage must be titrated for each individual. Any benefits resulting from botulism toxin tend to wear off after 3 months with a repeat injection required if benefit is to be maintained. Injections are usually done every 3-4 months with maximum effect peak achieved by about 2 or 3 weeks. Botulism toxin is expensive and you should be sure of what costs you will incur resulting from the injection.  The side effects of botulism toxin use for chronic migraine may include:   -Transient, and usually mild, facial weakness with facial injections  -Transient, and usually mild, head or neck weakness with head/neck injections  -Reduction or loss of forehead facial animation due to forehead muscle weakness  -Eyelid drooping  -Dry eye  -Pain at the site of injection or bruising at the site of injection  -Double vision  -Potential unknown long term risks   Contraindications: You should not have Botox  if you are pregnant, nursing, allergic to albumin, have an infection, skin condition, or muscle weakness at the site of the injection, or have myasthenia gravis, Lambert-Eaton syndrome, or ALS.  It is  also possible that as with any injection, there may be an allergic reaction or no effect from the medication. Reduced effectiveness after repeated injections is sometimes seen and rarely infection at the injection site may occur. All care will be taken to prevent these side effects. If therapy is given over a long time, atrophy and wasting in the muscle injected may occur. Occasionally the patient's become refractory to treatment because they develop antibodies to the toxin. In this event, therapy needs to be modified.  I have read the above information and consent to the administration of botulism toxin.    BOTOX  PROCEDURE NOTE FOR MIGRAINE HEADACHE  Contraindications and precautions discussed with patient(above). Aseptic procedure was observed and patient tolerated procedure. Procedure performed by Johny Nap, AGNP-BC.   The condition has existed for more than 6 months, and pt does not have a diagnosis of ALS, Myasthenia Gravis or Lambert-Eaton Syndrome.  Risks and benefits of injections discussed and pt agrees to proceed with the procedure.  Written consent obtained  These injections are medically necessary. Pt  receives good benefits from these injections. These injections do not cause sedations or hallucinations which the oral therapies may cause.   Description of procedure:  The patient was placed in a sitting position. The standard protocol was used for Botox  as follows, with 5 units of Botox  injected at each site:  -Procerus muscle, midline injection  -Corrugator muscle, bilateral injection  -Frontalis muscle, bilateral injection, with 2 sites each side, medial injection was performed in the upper one third of the frontalis muscle, in the region vertical  from the medial inferior edge of the superior orbital rim. The lateral injection was again in the upper one third of the forehead vertically above the lateral limbus of the cornea, 1.5 cm lateral to the medial injection  site.  -Temporalis muscle injection, 4 sites, bilaterally. The first injection was 3 cm above the tragus of the ear, second injection site was 1.5 cm to 3 cm up from the first injection site in line with the tragus of the ear. The third injection site was 1.5-3 cm forward between the first 2 injection sites. The fourth injection site was 1.5 cm posterior to the second injection site. 5th site laterally in the temporalis  muscleat the level of the outer canthus.  -Occipitalis muscle injection, 3 sites, bilaterally. The first injection was done one half way between the occipital protuberance and the tip of the mastoid process behind the ear. The second injection site was done lateral and superior to the first, 1 fingerbreadth from the first injection. The third injection site was 1 fingerbreadth superiorly and medially from the first injection site.  -Cervical paraspinal muscle injection, 2 sites, bilaterally. The first injection site was 1 cm from the midline of the cervical spine, 3 cm inferior to the lower border of the occipital protuberance. The second injection site was 1.5 cm superiorly and laterally to the first injection site.  -Trapezius muscle injection was performed at 3 sites, bilaterally. The first injection site was in the upper trapezius muscle halfway between the inflection point of the neck, and the acromion. The second injection site was one half way between the acromion and the first injection site. The third injection was done between the first injection site and the inflection point of the neck.    A total of 200 units of Botox  was prepared, 155 units of Botox  was injected as documented above, any Botox  not injected was wasted. The patient tolerated the procedure well, there were no complications of the above procedure.   Johny Nap, AGNP-BC  [facility] [address] [place], [place] [address]  Phone [phone] Fax [phone] Note:  This document was prepared with digital dictation and possible smart phrase technology. Any transcriptional errors that result from this process are unintentional.

## 2023-11-02 DIAGNOSIS — C44629 Squamous cell carcinoma of skin of left upper limb, including shoulder: Secondary | ICD-10-CM | POA: Diagnosis not present

## 2023-11-02 DIAGNOSIS — I87392 Chronic venous hypertension (idiopathic) with other complications of left lower extremity: Secondary | ICD-10-CM | POA: Diagnosis not present

## 2023-11-25 ENCOUNTER — Ambulatory Visit
Admission: RE | Admit: 2023-11-25 | Discharge: 2023-11-25 | Disposition: A | Discharge status: Home / Self Care | Payer: Medicare Other | Source: Ambulatory Visit | Attending: Primary Care

## 2023-11-25 ENCOUNTER — Ambulatory Visit
Admission: RE | Admit: 2023-11-25 | Discharge: 2023-11-25 | Disposition: A | Discharge status: Home / Self Care | Payer: Medicare Other | Source: Ambulatory Visit | Attending: Primary Care | Admitting: Primary Care

## 2023-11-25 DIAGNOSIS — Z1231 Encounter for screening mammogram for malignant neoplasm of breast: Secondary | ICD-10-CM | POA: Diagnosis not present

## 2023-11-25 DIAGNOSIS — E2839 Other primary ovarian failure: Secondary | ICD-10-CM

## 2023-11-25 DIAGNOSIS — M8588 Other specified disorders of bone density and structure, other site: Secondary | ICD-10-CM | POA: Diagnosis not present

## 2023-11-26 ENCOUNTER — Ambulatory Visit: Payer: Self-pay | Admitting: Primary Care

## 2023-11-26 DIAGNOSIS — M810 Age-related osteoporosis without current pathological fracture: Secondary | ICD-10-CM

## 2023-11-26 MED ORDER — ALENDRONATE SODIUM 70 MG PO TABS: ORAL_TABLET | ORAL | 1 refills | Status: DC

## 2023-12-01 ENCOUNTER — Other Ambulatory Visit: Payer: Self-pay | Admitting: Primary Care

## 2023-12-01 DIAGNOSIS — R928 Other abnormal and inconclusive findings on diagnostic imaging of breast: Secondary | ICD-10-CM

## 2023-12-02 ENCOUNTER — Ambulatory Visit
Admission: RE | Admit: 2023-12-02 | Discharge: 2023-12-02 | Disposition: A | Discharge status: Home / Self Care | Source: Ambulatory Visit | Attending: Primary Care | Admitting: Primary Care

## 2023-12-02 ENCOUNTER — Ambulatory Visit: Payer: Self-pay | Admitting: Primary Care

## 2023-12-02 DIAGNOSIS — N6324 Unspecified lump in the left breast, lower inner quadrant: Secondary | ICD-10-CM | POA: Diagnosis not present

## 2023-12-02 DIAGNOSIS — R928 Other abnormal and inconclusive findings on diagnostic imaging of breast: Secondary | ICD-10-CM

## 2023-12-09 ENCOUNTER — Encounter

## 2023-12-09 ENCOUNTER — Other Ambulatory Visit

## 2024-01-17 ENCOUNTER — Telehealth: Payer: Self-pay | Admitting: Adult Health

## 2024-01-17 NOTE — Telephone Encounter (Signed)
 Pt called to  botox  appt the patient  has a conflit in schedule and wanted to know if she would be able to  Come the same day as her Dermatology  appt which is on  8-14  at 3:00 Pt live an hour away and thought her appt was today .

## 2024-01-18 ENCOUNTER — Ambulatory Visit: Admitting: Adult Health

## 2024-01-18 NOTE — Telephone Encounter (Signed)
 I could try to squeeze her in at 3:30pm, that would be the only slot that I have as I am otherwise completely booked. Not sure if this would conflict with her derm appointment.

## 2024-01-30 ENCOUNTER — Other Ambulatory Visit: Payer: Self-pay | Admitting: Primary Care

## 2024-01-30 DIAGNOSIS — J439 Emphysema, unspecified: Secondary | ICD-10-CM

## 2024-02-03 ENCOUNTER — Ambulatory Visit (INDEPENDENT_AMBULATORY_CARE_PROVIDER_SITE_OTHER): Admitting: Adult Health

## 2024-02-03 DIAGNOSIS — D225 Melanocytic nevi of trunk: Secondary | ICD-10-CM | POA: Diagnosis not present

## 2024-02-03 DIAGNOSIS — L814 Other melanin hyperpigmentation: Secondary | ICD-10-CM | POA: Diagnosis not present

## 2024-02-03 DIAGNOSIS — L570 Actinic keratosis: Secondary | ICD-10-CM | POA: Diagnosis not present

## 2024-02-03 DIAGNOSIS — Z85828 Personal history of other malignant neoplasm of skin: Secondary | ICD-10-CM | POA: Diagnosis not present

## 2024-02-03 DIAGNOSIS — G43711 Chronic migraine without aura, intractable, with status migrainosus: Secondary | ICD-10-CM

## 2024-02-03 DIAGNOSIS — D692 Other nonthrombocytopenic purpura: Secondary | ICD-10-CM | POA: Diagnosis not present

## 2024-02-03 DIAGNOSIS — C44319 Basal cell carcinoma of skin of other parts of face: Secondary | ICD-10-CM | POA: Diagnosis not present

## 2024-02-03 DIAGNOSIS — C44219 Basal cell carcinoma of skin of left ear and external auricular canal: Secondary | ICD-10-CM | POA: Diagnosis not present

## 2024-02-03 MED ORDER — ONABOTULINUMTOXINA 200 UNITS IJ SOLR
155.0000 [IU] | Freq: Once | INTRAMUSCULAR | Status: AC
  Administered 2024-02-03: 155 [IU] via INTRAMUSCULAR

## 2024-02-03 NOTE — Progress Notes (Signed)
 Update 10/25/2023 JM: Patient is being seen for repeatrepeat Botox injection. Prior initial injection on 10/25/2023.  Reports great improvement of migraine headaches after initial injection with >50% migraine reduction.  She has had some increased migraines recently but attributes this to recent sinus and ear infection. She tolerated procedure well today.  Return in 3 months for repeat injection.      Consent Form Botulism Toxin Injection For Chronic Migraine    Reviewed orally with patient, additionally signature is on file:  Botulism toxin has been approved by the Federal drug administration for treatment of chronic migraine. Botulism toxin does not cure chronic migraine and it may not be effective in some patients.  The administration of botulism toxin is accomplished by injecting a small amount of toxin into the muscles of the neck and head. Dosage must be titrated for each individual. Any benefits resulting from botulism toxin tend to wear off after 3 months with a repeat injection required if benefit is to be maintained. Injections are usually done every 3-4 months with maximum effect peak achieved by about 2 or 3 weeks. Botulism toxin is expensive and you should be sure of what costs you will incur resulting from the injection.  The side effects of botulism toxin use for chronic migraine may include:   -Transient, and usually mild, facial weakness with facial injections  -Transient, and usually mild, head or neck weakness with head/neck injections  -Reduction or loss of forehead facial animation due to forehead muscle weakness  -Eyelid drooping  -Dry eye  -Pain at the site of injection or bruising at the site of injection  -Double vision  -Potential unknown long term risks   Contraindications: You should not have Botox if you are pregnant, nursing, allergic to albumin, have an infection, skin condition, or muscle weakness at the site of the injection, or have myasthenia  gravis, Lambert-Eaton syndrome, or ALS.  It is also possible that as with any injection, there may be an allergic reaction or no effect from the medication. Reduced effectiveness after repeated injections is sometimes seen and rarely infection at the injection site may occur. All care will be taken to prevent these side effects. If therapy is given over a long time, atrophy and wasting in the muscle injected may occur. Occasionally the patient's become refractory to treatment because they develop antibodies to the toxin. In this event, therapy needs to be modified.  I have read the above information and consent to the administration of botulism toxin.    BOTOX PROCEDURE NOTE FOR MIGRAINE HEADACHE  Contraindications and precautions discussed with patient(above). Aseptic procedure was observed and patient tolerated procedure. Procedure performed by Harlene Bogaert, AGNP-BC.   The condition has existed for more than 6 months, and pt does not have a diagnosis of ALS, Myasthenia Gravis or Lambert-Eaton Syndrome.  Risks and benefits of injections discussed and pt agrees to proceed with the procedure.  Written consent obtained  These injections are medically necessary. Pt  receives good benefits from these injections. These injections do not cause sedations or hallucinations which the oral therapies may cause.   Description of procedure:  The patient was placed in a sitting position. The standard protocol was used for Botox as follows, with 5 units of Botox injected at each site:  -Procerus muscle, midline injection  -Corrugator muscle, bilateral injection  -Frontalis muscle, bilateral injection, with 2 sites each side, medial injection was performed in the upper one third of the frontalis muscle, in  the region vertical from the medial inferior edge of the superior orbital rim. The lateral injection was again in the upper one third of the forehead vertically above the lateral limbus of the cornea,  1.5 cm lateral to the medial injection site.  -Temporalis muscle injection, 4 sites, bilaterally. The first injection was 3 cm above the tragus of the ear, second injection site was 1.5 cm to 3 cm up from the first injection site in line with the tragus of the ear. The third injection site was 1.5-3 cm forward between the first 2 injection sites. The fourth injection site was 1.5 cm posterior to the second injection site. 5th site laterally in the temporalis  muscleat the level of the outer canthus.  -Occipitalis muscle injection, 3 sites, bilaterally. The first injection was done one half way between the occipital protuberance and the tip of the mastoid process behind the ear. The second injection site was done lateral and superior to the first, 1 fingerbreadth from the first injection. The third injection site was 1 fingerbreadth superiorly and medially from the first injection site.  -Cervical paraspinal muscle injection, 2 sites, bilaterally. The first injection site was 1 cm from the midline of the cervical spine, 3 cm inferior to the lower border of the occipital protuberance. The second injection site was 1.5 cm superiorly and laterally to the first injection site.  -Trapezius muscle injection was performed at 3 sites, bilaterally. The first injection site was in the upper trapezius muscle halfway between the inflection point of the neck, and the acromion. The second injection site was one half way between the acromion and the first injection site. The third injection was done between the first injection site and the inflection point of the neck.    A total of 200 units of Botox was prepared, 155 units of Botox was injected as documented above, any Botox not injected was wasted. The patient tolerated the procedure well, there were no complications of the above procedure.   Harlene Bogaert, AGNP-BC  [facility] [address] [place], [place] [address]  Phone  [phone] Fax [phone] Note: This document was prepared with digital dictation and possible smart phrase technology. Any transcriptional errors that result from this process are unintentional.

## 2024-02-03 NOTE — Progress Notes (Signed)
 Botox- 200 units x 1 vial Lot: [id number] Expiration: 04/2026 NDC: 9976-6078-97   Bacteriostatic 0.9% Sodium Chloride- 4 mL  Lot: [id number] Expiration: 04/21/2025 NDC: 9590-8033-97   Dx: G43.711  BB   Witnessed by: Rojean DEL

## 2024-03-30 DIAGNOSIS — C44219 Basal cell carcinoma of skin of left ear and external auricular canal: Secondary | ICD-10-CM | POA: Diagnosis not present

## 2024-03-30 DIAGNOSIS — C44319 Basal cell carcinoma of skin of other parts of face: Secondary | ICD-10-CM | POA: Diagnosis not present

## 2024-03-31 ENCOUNTER — Encounter: Payer: Medicare Other | Admitting: Primary Care

## 2024-04-06 ENCOUNTER — Encounter: Admitting: Primary Care

## 2024-04-11 NOTE — Telephone Encounter (Signed)
 Submitted auth request via CMM to switch pt to SP, status is pending. Key: [id number]

## 2024-04-24 MED ORDER — ONABOTULINUMTOXINA 200 UNITS IJ SOLR: INTRAMUSCULAR | 3 refills | Status: AC

## 2024-04-24 NOTE — Addendum Note (Signed)
 Addended by: WHITFIELD RAISIN L on: 04/24/2024 04:12 PM   Modules accepted: Orders

## 2024-04-24 NOTE — Addendum Note (Signed)
 Addended by: HILLIARD HEATHER CROME on: 04/24/2024 03:51 PM   Modules accepted: Orders

## 2024-04-24 NOTE — Telephone Encounter (Signed)
 Auth was approved, please send rx to [facility]  Auth#: [id number] (04/11/24-07/12/24)

## 2024-04-27 ENCOUNTER — Encounter: Payer: Self-pay | Admitting: Adult Health

## 2024-04-27 NOTE — Telephone Encounter (Signed)
 Delivery is pending pt consent.

## 2024-05-03 ENCOUNTER — Encounter: Payer: Self-pay | Admitting: Primary Care

## 2024-05-03 ENCOUNTER — Ambulatory Visit: Admitting: Primary Care

## 2024-05-03 VITALS — BP 136/64 | HR 83 | Temp 98.0°F | Ht 60.5 in | Wt 114.4 lb

## 2024-05-03 DIAGNOSIS — Z0000 Encounter for general adult medical examination without abnormal findings: Secondary | ICD-10-CM

## 2024-05-03 DIAGNOSIS — J439 Emphysema, unspecified: Secondary | ICD-10-CM

## 2024-05-03 DIAGNOSIS — G43709 Chronic migraine without aura, not intractable, without status migrainosus: Secondary | ICD-10-CM | POA: Diagnosis not present

## 2024-05-03 DIAGNOSIS — F419 Anxiety disorder, unspecified: Secondary | ICD-10-CM

## 2024-05-03 DIAGNOSIS — C519 Malignant neoplasm of vulva, unspecified: Secondary | ICD-10-CM

## 2024-05-03 DIAGNOSIS — H6120 Impacted cerumen, unspecified ear: Secondary | ICD-10-CM | POA: Insufficient documentation

## 2024-05-03 DIAGNOSIS — Z720 Tobacco use: Secondary | ICD-10-CM

## 2024-05-03 DIAGNOSIS — M810 Age-related osteoporosis without current pathological fracture: Secondary | ICD-10-CM

## 2024-05-03 DIAGNOSIS — H6122 Impacted cerumen, left ear: Secondary | ICD-10-CM | POA: Diagnosis not present

## 2024-05-03 DIAGNOSIS — E2839 Other primary ovarian failure: Secondary | ICD-10-CM

## 2024-05-03 MED ORDER — IBANDRONATE SODIUM 150 MG PO TABS: 150.0000 mg | ORAL_TABLET | ORAL | 3 refills | Status: AC

## 2024-05-03 NOTE — Assessment & Plan Note (Addendum)
 Bone density scan UTD.  Discontinue Fosamax .   Start Boniva 150 mg monthly.  New Rx sent to pharmacy.

## 2024-05-03 NOTE — Progress Notes (Signed)
 Subjective:    Patient ID: Betty Hurst, female    DOB: 21-Mar-1956, 69 y.o.   MRN: [id number]  Betty Hurst is a very pleasant 68 y.o. female who presents today for complete physical and follow up of chronic conditions.  She is experiencing side effects from Fosamax . Symptoms include feeling very hot and like she may faint. Symptoms began when she began taking Fosamax .   She has recently noticed left ear pain after working outdoors for several hours.  She has also noticed decreased ability to hear.  Immunizations: -Tetanus: Completed in 2010 -Influenza: Declines influenza vaccine.  -Shingles: Never had chickenpox -Pneumonia: Completed Prevnar 20 in 2024  Diet: Fair diet.  Exercise: No regular exercise.  Eye exam: Completes annually  Dental exam: Completes semi-annually     Mammogram: Completed in June 2025 Bone Density Scan: Completed June 2025  Colonoscopy: Completed in 2013, due 2023. Completed Cologuard in 2024, negative.   BP Readings from Last 3 Encounters:  05/03/24 136/64  10/25/23 135/73  08/10/23 131/75        Review of Systems  Constitutional:  Negative for unexpected weight change.  HENT:  Positive for ear pain. Negative for rhinorrhea.        Decreased hearing  Respiratory:  Negative for cough and shortness of breath.   Cardiovascular:  Negative for chest pain.  Gastrointestinal:  Negative for constipation and diarrhea.  Genitourinary:  Negative for difficulty urinating.  Musculoskeletal:  Positive for arthralgias.  Skin:  Negative for rash.  Allergic/Immunologic: Negative for environmental allergies.  Neurological:  Negative for dizziness, numbness and headaches.  Psychiatric/Behavioral:  The patient is nervous/anxious.          Past Medical History:  Diagnosis Date   Acute bilateral thoracic back pain 06/21/2023   Acute neck pain 01/08/2015   Acute non-recurrent sinusitis 10/13/2018   Allergy 2016   Anemia    Anxiety 10/20/2021    Asthma    simbicort, no rescue inhaler use   Basal cell carcinoma of skin    on forehead and temple, and left side of nose   Complication of anesthesia    Frequent sinus infections    GERD (gastroesophageal reflux disease) 09/20/2021   Headache    History of radiation therapy    Vulva- 01/29/22-03/17/22- Dr. Lynwood Nasuti   Migraine    PONV (postoperative nausea and vomiting)    Small bowel obstruction [facility])    Surgery in 2006   Vaginal cyst 09/25/2014   Vulvar cancer [facility])     Social History   Socioeconomic History   Marital status: Widowed    Spouse name: Not on file   Number of children: 2   Years of education: 2 years college   Highest education level: Not on file  Occupational History   Not on file  Tobacco Use   Smoking status: Some Days    Current packs/day: 0.25    Types: Cigarettes   Smokeless tobacco: Never   Tobacco comments:    Patient has cut back a lot since her husband has been sick.  Vaping Use   Vaping status: Never Used  Substance and Sexual Activity   Alcohol use: Not Currently    Comment: daily occas   Drug use: No   Sexual activity: Yes    Partners: Male  Other Topics Concern   Not on file  Social History Narrative   Widowed   Lives in [place].   2 daughters, 4 grandchildren.   On disability  from work.   Enjoys being outside, gardening, volunteers at [employer].   Caffeine: diet coke 16-24 oz per day   Social Drivers of Health   Financial Resource Strain: Low Risk  (01/01/2022)   Overall Financial Resource Strain (CARDIA)    Difficulty of Paying Living Expenses: Not hard at all  Food Insecurity: No Food Insecurity (01/01/2022)   Hunger Vital Sign    Worried About Running Out of Food in the Last Year: Never true    Ran Out of Food in the Last Year: Never true  Transportation Needs: No Transportation Needs (01/01/2022)   PRAPARE - Administrator, Civil Service (Medical): No    Lack of Transportation  (Non-Medical): No  Physical Activity: Inactive (01/01/2022)   Exercise Vital Sign    Days of Exercise per Week: 0 days    Minutes of Exercise per Session: 0 min  Stress: Stress Concern Present (01/01/2022)   [employer] of Occupational Health - Occupational Stress Questionnaire    Feeling of Stress : To some extent  Social Connections: Not on file  Intimate Partner Violence: Not on file    Past Surgical History:  Procedure Laterality Date   ABDOMINAL HYSTERECTOMY  1989   thinks had BSO   COLPOSCOPY N/A 11/11/2021   Procedure: COLPOSCOPY OF CERVIX WITH POSSIBLE BIOPSIES;  Surgeon: Viktoria Comer SAUNDERS, MD;  Location: [facility];  Service: Gynecology;  Laterality: N/A;   HEMORROIDECTOMY     INGUINAL LYMPHADENECTOMY Bilateral 11/11/2021   Procedure: INGUINAL LYMPHADENECTOMY;  Surgeon: Viktoria Comer SAUNDERS, MD;  Location: [facility];  Service: Gynecology;  Laterality: Bilateral;   NASAL SINUS SURGERY     14 surgeries   OTHER SURGICAL HISTORY     Bladder sling   RADICAL VULVECTOMY N/A 11/11/2021   Procedure: PARTIAL RADICAL VULVECTOMY;  Surgeon: Viktoria Comer SAUNDERS, MD;  Location: [facility];  Service: Gynecology;  Laterality: N/A;   SKIN BIOPSY     basal cell on face   SMALL INTESTINE SURGERY     Small bowel obstruction in 2006   TONSILLECTOMY     vulvectomy      Family History  Problem Relation Age of Onset   Cancer Father    Stroke Father        Deceased   Colon cancer Father    Pancreatic cancer Maternal Uncle    Kidney disease Maternal Grandfather    Stroke Paternal Grandmother    Renal Disease Paternal Grandfather    Heart failure Paternal Grandfather    Heart attack Paternal Grandfather    Breast cancer Niece    Migraines Neg Hx     Allergies  Allergen Reactions   Influenza Vaccines Hives   Morphine Hives and Swelling   Amitriptyline Rash   Cefuroxime Axetil Rash   Lorazepam  Palpitations   Pyridium  [Phenazopyridine  Hcl] Palpitations    Felt jittery    Current  Outpatient Medications on File Prior to Visit  Medication Sig Dispense Refill   acetaminophen  (TYLENOL ) 500 MG tablet Take 1,000 mg by mouth every 6 (six) hours as needed for headache.     botulinum toxin Type A  (BOTOX ) 200 units injection Provider to inject 155 units into the muscles of the head and neck every 12 weeks. Discard remainder. 1 each 3   budesonide -formoterol  (SYMBICORT ) 160-4.5 MCG/ACT inhaler USE 2 INHALATIONS BY MOUTH TWICE DAILY 30.6 g 0   diphenhydrAMINE  HCl (BENADRYL  ALLERGY PO) Take 1 tablet by mouth as needed (allergies).     diphenhydramine -acetaminophen  (TYLENOL   PM) 25-500 MG TABS tablet Take 1 tablet by mouth at bedtime as needed (pain).     omeprazole (PRILOSEC OTC) 20 MG tablet Take 20 mg by mouth daily.     Phenyleph-CPM-DM-APAP (ALKA-SELTZER PLUS COLD & COUGH) 10-21-08-325 MG CAPS Take 2 tablets by mouth every 8 (eight) hours as needed (cold/ allergies).     No current facility-administered medications on file prior to visit.    BP 136/64   Pulse 83   Temp 98 F (36.7 C) (Oral)   Ht 5' 0.5 (1.537 m)   Wt 114 lb 6 oz (51.9 kg)   SpO2 98%   BMI 21.97 kg/m  Objective:   Physical Exam HENT:     Right Ear: Tympanic membrane and ear canal normal.     Left Ear: There is impacted cerumen.  Eyes:     Pupils: Pupils are equal, round, and reactive to light.  Cardiovascular:     Rate and Rhythm: Normal rate and regular rhythm.  Pulmonary:     Effort: Pulmonary effort is normal.     Breath sounds: Normal breath sounds.  Abdominal:     General: Bowel sounds are normal.     Palpations: Abdomen is soft.     Tenderness: There is no abdominal tenderness.  Musculoskeletal:        General: Normal range of motion.     Cervical back: Neck supple.  Skin:    General: Skin is warm and dry.  Neurological:     Mental Status: She is alert and oriented to person, place, and time.     Cranial Nerves: No cranial nerve deficit.     Deep Tendon Reflexes:     Reflex Scores:       Patellar reflexes are 2+ on the right side and 2+ on the left side. Psychiatric:        Mood and Affect: Mood normal.     Physical Exam        Assessment & Plan:  Preventative health care Assessment & Plan: Immunizations UTD. Declines influenza vaccine.  Mammogram and bone density scan up-to-date Colon cancer screening, Cologuard, up-to-date.  Due 2027  Discussed the importance of a healthy diet and regular exercise in order for weight loss, and to reduce the risk of further co-morbidity.  Exam stable. Labs pending.  Follow up in 1 year for repeat physical.   Orders: -     Lipid panel -     Comprehensive metabolic panel with GFR -     CBC with Differential/Platelet  Chronic migraine without aura without status migrainosus, not intractable Assessment & Plan: Improved.  Continue Botox  injections every 3 months.    Pulmonary emphysema (HCC) Assessment & Plan: Controlled.  Continue Symbicort  160-4.5 mcg, 2 puffs BID. Continue albuterol  inhaler PRN.  Commended her smoking cessation!   Tobacco abuse Assessment & Plan: Commended her on smoking cessation!  Orders: -     Lipid panel -     Comprehensive metabolic panel with GFR -     CBC with Differential/Platelet  Age-related osteoporosis without current pathological fracture Assessment & Plan: Bone density scan UTD.  Discontinue Fosamax .   Start Boniva 150 mg monthly.  New Rx sent to pharmacy.   Orders: -     Ibandronate Sodium; Take 1 tablet (150 mg total) by mouth every 30 (thirty) days. Take in the morning with a full glass of water, on an empty stomach, and do not take anything else by mouth or lie down for  the next 30 min.  Dispense: 3 tablet; Refill: 3  Anxiety Assessment & Plan: Stable.  Continue to monitor.    Estrogen deficiency -     Ibandronate Sodium; Take 1 tablet (150 mg total) by mouth every 30 (thirty) days. Take in the morning with a full glass of water, on an empty stomach,  and do not take anything else by mouth or lie down for the next 30 min.  Dispense: 3 tablet; Refill: 3  Vulvar cancer (HCC) Assessment & Plan: Released.   No recurrence per patient.   Impacted cerumen of left ear Assessment & Plan: Left cerumen impaction identified on exam. Patient consented to irrigation of canal. Left canals irrigated. Patient tolerated well. Unable to completely remove wax from left canal.  We discussed Debrox drops and over-the-counter irrigation kit.  She is also welcome to come back when she has used Debrox drops for another irrigation.        Assessment and Plan Assessment & Plan         Comer MARLA Gaskins, NP    History of Present Illness

## 2024-05-03 NOTE — Assessment & Plan Note (Signed)
 Controlled.  Continue Symbicort  160-4.5 mcg, 2 puffs BID. Continue albuterol  inhaler PRN.  Commended her smoking cessation!

## 2024-05-03 NOTE — Assessment & Plan Note (Signed)
 Immunizations UTD. Declines influenza vaccine.  Mammogram and bone density scan up-to-date Colon cancer screening, Cologuard, up-to-date.  Due 2027  Discussed the importance of a healthy diet and regular exercise in order for weight loss, and to reduce the risk of further co-morbidity.  Exam stable. Labs pending.  Follow up in 1 year for repeat physical.

## 2024-05-03 NOTE — Patient Instructions (Signed)
 Stop by the lab prior to leaving today. I will notify you of your results once received.   Stop taking Fosamax  for bone density.  Start ibandronate (Boniva) 150 mg tablets for bone density.  Take 1 tablet by mouth with a full glass of water on an empty stomach once monthly.  Avoid laying flat for 2 hours after taking.  It was a pleasure to see you today!

## 2024-05-03 NOTE — Assessment & Plan Note (Signed)
 Stable.       - Continue to monitor

## 2024-05-03 NOTE — Assessment & Plan Note (Signed)
 Commended her on smoking cessation!

## 2024-05-03 NOTE — Assessment & Plan Note (Addendum)
 Left cerumen impaction identified on exam. Patient consented to irrigation of canal. Left canals irrigated. Patient tolerated well. Unable to completely remove wax from left canal.  We discussed Debrox drops and over-the-counter irrigation kit.  She is also welcome to come back when she has used Debrox drops for another irrigation.

## 2024-05-03 NOTE — Assessment & Plan Note (Signed)
 Improved.  Continue Botox  injections every 3 months.

## 2024-05-03 NOTE — Assessment & Plan Note (Signed)
 Released.   No recurrence per patient.

## 2024-05-04 ENCOUNTER — Ambulatory Visit: Payer: Self-pay | Admitting: Primary Care

## 2024-05-04 DIAGNOSIS — R718 Other abnormality of red blood cells: Secondary | ICD-10-CM

## 2024-05-04 DIAGNOSIS — E538 Deficiency of other specified B group vitamins: Secondary | ICD-10-CM

## 2024-05-04 LAB — CBC WITH DIFFERENTIAL/PLATELET
Basophils Absolute: 0 K/uL (ref 0.0–0.1)
Basophils Relative: 1 % (ref 0.0–3.0)
Eosinophils Absolute: 0.1 K/uL (ref 0.0–0.7)
Eosinophils Relative: 1.5 % (ref 0.0–5.0)
HCT: 36 % (ref 36.0–46.0)
Hemoglobin: 12.4 g/dL (ref 12.0–15.0)
Lymphocytes Relative: 15.5 % (ref 12.0–46.0)
Lymphs Abs: 0.7 K/uL (ref 0.7–4.0)
MCHC: 34.6 g/dL (ref 30.0–36.0)
MCV: 114.5 fl — ABNORMAL HIGH (ref 78.0–100.0)
Monocytes Absolute: 0.4 K/uL (ref 0.1–1.0)
Monocytes Relative: 9.4 % (ref 3.0–12.0)
Neutro Abs: 3.4 K/uL (ref 1.4–7.7)
Neutrophils Relative %: 72.6 % (ref 43.0–77.0)
Platelets: 143 K/uL — ABNORMAL LOW (ref 150.0–400.0)
RBC: 3.14 Mil/uL — ABNORMAL LOW (ref 3.87–5.11)
RDW: 12.5 % (ref 11.5–15.5)
WBC: 4.7 K/uL (ref 4.0–10.5)

## 2024-05-04 LAB — COMPREHENSIVE METABOLIC PANEL WITH GFR
ALT: 13 U/L (ref 0–35)
AST: 19 U/L (ref 0–37)
Albumin: 4.2 g/dL (ref 3.5–5.2)
Alkaline Phosphatase: 40 U/L (ref 39–117)
BUN: 16 mg/dL (ref 6–23)
CO2: 28 meq/L (ref 19–32)
Calcium: 9.5 mg/dL (ref 8.4–10.5)
Chloride: 98 meq/L (ref 96–112)
Creatinine, Ser: 0.7 mg/dL (ref 0.40–1.20)
GFR: 90.32 mL/min (ref >60.00)
Glucose, Bld: 95 mg/dL (ref 70–99)
Potassium: 4.8 meq/L (ref 3.5–5.1)
Sodium: 135 meq/L (ref 135–145)
Total Bilirubin: 0.4 mg/dL (ref 0.2–1.2)
Total Protein: 7.2 g/dL (ref 6.0–8.3)

## 2024-05-04 LAB — LIPID PANEL
Cholesterol: 198 mg/dL (ref 0–200)
HDL: 102.6 mg/dL (ref >39.00)
LDL Cholesterol: 77 mg/dL (ref 0–99)
NonHDL: 95.48
Total CHOL/HDL Ratio: 2
Triglycerides: 94 mg/dL (ref 0.0–149.0)
VLDL: 18.8 mg/dL (ref 0.0–40.0)

## 2024-05-08 ENCOUNTER — Ambulatory Visit: Admitting: Adult Health

## 2024-05-08 ENCOUNTER — Encounter: Payer: Self-pay | Admitting: Adult Health

## 2024-05-08 VITALS — BP 124/80 | HR 81

## 2024-05-08 DIAGNOSIS — G43711 Chronic migraine without aura, intractable, with status migrainosus: Secondary | ICD-10-CM

## 2024-05-08 MED ORDER — ONABOTULINUMTOXINA 200 UNITS IJ SOLR
155.0000 [IU] | Freq: Once | INTRAMUSCULAR | Status: AC
  Administered 2024-05-08: 155 [IU] via INTRAMUSCULAR

## 2024-05-08 NOTE — Progress Notes (Signed)
 Botox - 200 units x 1 vial Lot: [id number] Expiration: 6/27 NDC: 9976-6078-97  Bacteriostatic 0.9% Sodium Chloride - 4 mL  Lot: [id number] Expiration: 04/21/25 NDC: 9590-8033-97  Dx: H56.288 S/P  Witnessed by Rojean, CMA

## 2024-05-08 NOTE — Progress Notes (Signed)
 Update 05/08/2024 JM: Patient is being seen for her 3rd Botox  injection. Prior injection on 02/03/2024.  Reports great improvement of migraine headaches on botox  with >50% migraine reduction.  She has had some milder headaches but attributes this to sinus issues.  She tolerated procedure well today.  Return in 3 months for repeat injection.      Consent Form Botulism Toxin Injection For Chronic Migraine    Reviewed orally with patient, additionally signature is on file:  Botulism toxin has been approved by the Federal drug administration for treatment of chronic migraine. Botulism toxin does not cure chronic migraine and it may not be effective in some patients.  The administration of botulism toxin is accomplished by injecting a small amount of toxin into the muscles of the neck and head. Dosage must be titrated for each individual. Any benefits resulting from botulism toxin tend to wear off after 3 months with a repeat injection required if benefit is to be maintained. Injections are usually done every 3-4 months with maximum effect peak achieved by about 2 or 3 weeks. Botulism toxin is expensive and you should be sure of what costs you will incur resulting from the injection.  The side effects of botulism toxin use for chronic migraine may include:   -Transient, and usually mild, facial weakness with facial injections  -Transient, and usually mild, head or neck weakness with head/neck injections  -Reduction or loss of forehead facial animation due to forehead muscle weakness  -Eyelid drooping  -Dry eye  -Pain at the site of injection or bruising at the site of injection  -Double vision  -Potential unknown long term risks   Contraindications: You should not have Botox  if you are pregnant, nursing, allergic to albumin, have an infection, skin condition, or muscle weakness at the site of the injection, or have myasthenia gravis, Lambert-Eaton syndrome, or ALS.  It is also  possible that as with any injection, there may be an allergic reaction or no effect from the medication. Reduced effectiveness after repeated injections is sometimes seen and rarely infection at the injection site may occur. All care will be taken to prevent these side effects. If therapy is given over a long time, atrophy and wasting in the muscle injected may occur. Occasionally the patient's become refractory to treatment because they develop antibodies to the toxin. In this event, therapy needs to be modified.  I have read the above information and consent to the administration of botulism toxin.    BOTOX  PROCEDURE NOTE FOR MIGRAINE HEADACHE  Contraindications and precautions discussed with patient(above). Aseptic procedure was observed and patient tolerated procedure. Procedure performed by Harlene Bogaert, AGNP-BC.   The condition has existed for more than 6 months, and pt does not have a diagnosis of ALS, Myasthenia Gravis or Lambert-Eaton Syndrome.  Risks and benefits of injections discussed and pt agrees to proceed with the procedure.  Written consent obtained  These injections are medically necessary. Pt  receives good benefits from these injections. These injections do not cause sedations or hallucinations which the oral therapies may cause.   Description of procedure:  The patient was placed in a sitting position. The standard protocol was used for Botox  as follows, with 5 units of Botox  injected at each site:  -Procerus muscle, midline injection  -Corrugator muscle, bilateral injection  -Frontalis muscle, bilateral injection, with 2 sites each side, medial injection was performed in the upper one third of the frontalis muscle, in the region vertical from  the medial inferior edge of the superior orbital rim. The lateral injection was again in the upper one third of the forehead vertically above the lateral limbus of the cornea, 1.5 cm lateral to the medial injection  site.  -Temporalis muscle injection, 4 sites, bilaterally. The first injection was 3 cm above the tragus of the ear, second injection site was 1.5 cm to 3 cm up from the first injection site in line with the tragus of the ear. The third injection site was 1.5-3 cm forward between the first 2 injection sites. The fourth injection site was 1.5 cm posterior to the second injection site. 5th site laterally in the temporalis  muscleat the level of the outer canthus.  -Occipitalis muscle injection, 3 sites, bilaterally. The first injection was done one half way between the occipital protuberance and the tip of the mastoid process behind the ear. The second injection site was done lateral and superior to the first, 1 fingerbreadth from the first injection. The third injection site was 1 fingerbreadth superiorly and medially from the first injection site.  -Cervical paraspinal muscle injection, 2 sites, bilaterally. The first injection site was 1 cm from the midline of the cervical spine, 3 cm inferior to the lower border of the occipital protuberance. The second injection site was 1.5 cm superiorly and laterally to the first injection site.  -Trapezius muscle injection was performed at 3 sites, bilaterally. The first injection site was in the upper trapezius muscle halfway between the inflection point of the neck, and the acromion. The second injection site was one half way between the acromion and the first injection site. The third injection was done between the first injection site and the inflection point of the neck.    A total of 200 units of Botox  was prepared, 155 units of Botox  was injected as documented above, any Botox  not injected was wasted. The patient tolerated the procedure well, there were no complications of the above procedure.   Harlene Bogaert, AGNP-BC  [facility] [address] [place], [place] [address]  Phone [phone] Fax [phone] Note:  This document was prepared with digital dictation and possible smart phrase technology. Any transcriptional errors that result from this process are unintentional.

## 2024-05-11 ENCOUNTER — Telehealth: Payer: Self-pay | Admitting: Primary Care

## 2024-05-11 NOTE — Telephone Encounter (Signed)
 Copied from CRM #[id number]. Topic: Clinical - Request for Lab/Test Order >> May 10, 2024  5:00 PM Joesph B wrote: Reason for CRM: patient needs a appointment scheduled for labs, orders are in.

## 2024-05-11 NOTE — Telephone Encounter (Signed)
 lvm for pt to call office to schedule appt.

## 2024-05-12 ENCOUNTER — Ambulatory Visit
Admission: EM | Admit: 2024-05-12 | Discharge: 2024-05-12 | Disposition: A | Discharge status: Home / Self Care | Attending: Emergency Medicine | Admitting: Emergency Medicine

## 2024-05-12 ENCOUNTER — Encounter: Payer: Self-pay | Admitting: Emergency Medicine

## 2024-05-12 ENCOUNTER — Other Ambulatory Visit (INDEPENDENT_AMBULATORY_CARE_PROVIDER_SITE_OTHER)

## 2024-05-12 ENCOUNTER — Other Ambulatory Visit: Payer: Self-pay | Admitting: Primary Care

## 2024-05-12 DIAGNOSIS — S81811A Laceration without foreign body, right lower leg, initial encounter: Secondary | ICD-10-CM

## 2024-05-12 DIAGNOSIS — R718 Other abnormality of red blood cells: Secondary | ICD-10-CM

## 2024-05-12 DIAGNOSIS — E538 Deficiency of other specified B group vitamins: Secondary | ICD-10-CM

## 2024-05-12 DIAGNOSIS — M810 Age-related osteoporosis without current pathological fracture: Secondary | ICD-10-CM

## 2024-05-12 MED ORDER — DOXYCYCLINE HYCLATE 100 MG PO CAPS: 100.0000 mg | ORAL_CAPSULE | Freq: Two times a day (BID) | ORAL | 0 refills | Status: AC
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
## 2024-05-12 NOTE — Discharge Instructions (Signed)
 Your evaluated for the wound to the right leg and as you have seen puslike drainage and are having swelling you have concerns for infection and you will be started on antibiotic  Take doxycycline  twice daily for 7 days  Wound has been cleansed here with antiseptic, at home cleanse with unscented soap and water, pat and do not rub, if the area is draining or can be rubbed by clothing cover with a nonstick Band-Aid otherwise may leave open to air  You may take Tylenol  as needed for pain  You may elevate the leg when sitting and lying on pillows to help further reduce swelling  You may continue activity as tolerable  You may follow-up with urgent care or your primary doctor for any further concerns regarding healing
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
## 2024-05-13 ENCOUNTER — Ambulatory Visit: Payer: Self-pay | Admitting: Primary Care

## 2024-05-15 LAB — RETICULOCYTES
ABS Retic: 68200 {cells}/uL (ref 20000–80000)
Retic Ct Pct: 2.2 %

## 2024-05-15 LAB — IRON, TOTAL/TOTAL IRON BINDING CAP
%SAT: 35 % (ref 16–45)
Iron: 132 ug/dL (ref 45–160)
TIBC: 376 ug/dL (ref 250–450)

## 2024-05-15 LAB — VITAMIN B12: Vitamin B-12: >2000 pg/mL — ABNORMAL HIGH (ref 200–1100)

## 2024-05-15 LAB — TSH: TSH: 0.5 m[IU]/L (ref 0.40–4.50)

## 2024-05-15 LAB — T4, FREE: Free T4: 1 ng/dL (ref 0.8–1.8)

## 2024-05-15 LAB — FOLATE: Folate: 7.8 ng/mL

## 2024-05-15 LAB — FERRITIN: Ferritin: 94 ng/mL (ref 16–288)

## 2024-05-15 LAB — HOMOCYSTEINE: Homocysteine: 17.8 umol/L — ABNORMAL HIGH (ref <=13.4)

## 2024-05-27 ENCOUNTER — Other Ambulatory Visit: Payer: Self-pay | Admitting: Primary Care

## 2024-05-27 DIAGNOSIS — J439 Emphysema, unspecified: Secondary | ICD-10-CM

## 2024-07-06 ENCOUNTER — Inpatient Hospital Stay

## 2024-07-06 ENCOUNTER — Inpatient Hospital Stay: Admitting: Hematology and Oncology

## 2024-07-27 ENCOUNTER — Telehealth: Payer: Self-pay | Admitting: Adult Health

## 2024-07-27 NOTE — Telephone Encounter (Signed)
 Auth#: [id number] (07/27/24-10/24/24)

## 2024-07-27 NOTE — Telephone Encounter (Signed)
 Submitted PA renewal request via CMM, status is pending. Key: [id number]

## 2024-07-31 ENCOUNTER — Inpatient Hospital Stay

## 2024-07-31 ENCOUNTER — Inpatient Hospital Stay: Admitting: Hematology and Oncology

## 2024-08-02 ENCOUNTER — Ambulatory Visit: Admitting: Adult Health

## 2024-08-23 ENCOUNTER — Ambulatory Visit: Admitting: Adult Health

## 2025-05-09 ENCOUNTER — Encounter: Admitting: Primary Care
